# Patient Record
Sex: Male | Born: 1955 | Race: Black or African American | Hispanic: No | Marital: Married | Smoking: Former smoker
Health system: Southern US, Community
[De-identification: ages and names within clinical notes are randomized; demographics above are authoritative.]

## PROBLEM LIST (undated history)

## (undated) DIAGNOSIS — E785 Hyperlipidemia, unspecified: Secondary | ICD-10-CM

## (undated) DIAGNOSIS — I2510 Atherosclerotic heart disease of native coronary artery without angina pectoris: Secondary | ICD-10-CM

## (undated) DIAGNOSIS — G8929 Other chronic pain: Secondary | ICD-10-CM

## (undated) DIAGNOSIS — M25559 Pain in unspecified hip: Secondary | ICD-10-CM

## (undated) DIAGNOSIS — I10 Essential (primary) hypertension: Secondary | ICD-10-CM

## (undated) DIAGNOSIS — I219 Acute myocardial infarction, unspecified: Secondary | ICD-10-CM

## (undated) DIAGNOSIS — K219 Gastro-esophageal reflux disease without esophagitis: Secondary | ICD-10-CM

## (undated) DIAGNOSIS — E119 Type 2 diabetes mellitus without complications: Secondary | ICD-10-CM

## (undated) DIAGNOSIS — Z720 Tobacco use: Secondary | ICD-10-CM

## (undated) DIAGNOSIS — M549 Dorsalgia, unspecified: Secondary | ICD-10-CM

## (undated) HISTORY — PX: TOTAL HIP ARTHROPLASTY: SHX124

## (undated) HISTORY — DX: Acute myocardial infarction, unspecified: I21.9

---

## 1998-12-05 ENCOUNTER — Encounter: Payer: Self-pay | Admitting: Cardiovascular Disease

## 1998-12-05 ENCOUNTER — Inpatient Hospital Stay (HOSPITAL_COMMUNITY): Admission: EM | Admit: 1998-12-05 | Discharge: 1998-12-06 | Payer: Self-pay | Admitting: Emergency Medicine

## 2002-11-04 ENCOUNTER — Emergency Department (HOSPITAL_COMMUNITY): Admission: EM | Admit: 2002-11-04 | Discharge: 2002-11-04 | Payer: Self-pay | Admitting: Emergency Medicine

## 2005-12-09 ENCOUNTER — Ambulatory Visit (HOSPITAL_COMMUNITY): Admission: RE | Admit: 2005-12-09 | Discharge: 2005-12-09 | Payer: Self-pay | Admitting: Family Medicine

## 2005-12-16 ENCOUNTER — Encounter (INDEPENDENT_AMBULATORY_CARE_PROVIDER_SITE_OTHER): Payer: Self-pay | Admitting: Specialist

## 2005-12-16 ENCOUNTER — Inpatient Hospital Stay (HOSPITAL_COMMUNITY): Admission: RE | Admit: 2005-12-16 | Discharge: 2005-12-23 | Payer: Self-pay | Admitting: Orthopaedic Surgery

## 2005-12-24 ENCOUNTER — Encounter (HOSPITAL_COMMUNITY): Admission: RE | Admit: 2005-12-24 | Discharge: 2006-01-23 | Payer: Self-pay | Admitting: Orthopaedic Surgery

## 2005-12-24 ENCOUNTER — Ambulatory Visit (HOSPITAL_COMMUNITY): Payer: Self-pay | Admitting: Orthopaedic Surgery

## 2005-12-25 ENCOUNTER — Ambulatory Visit (HOSPITAL_COMMUNITY): Payer: Self-pay | Admitting: Orthopedic Surgery

## 2005-12-29 ENCOUNTER — Encounter (HOSPITAL_COMMUNITY): Admission: RE | Admit: 2005-12-29 | Discharge: 2006-01-28 | Payer: Self-pay | Admitting: Orthopaedic Surgery

## 2006-02-08 ENCOUNTER — Encounter (HOSPITAL_COMMUNITY): Admission: RE | Admit: 2006-02-08 | Discharge: 2006-03-10 | Payer: Self-pay | Admitting: Orthopaedic Surgery

## 2008-08-09 ENCOUNTER — Emergency Department (HOSPITAL_COMMUNITY): Admission: EM | Admit: 2008-08-09 | Discharge: 2008-08-09 | Payer: Self-pay | Admitting: Emergency Medicine

## 2011-02-12 NOTE — H&P (Signed)
[name], [name]            ACCOUNT NO.:  [id number]   MEDICAL RECORD NO.:  [id number]          PATIENT TYPE:  AMB   LOCATION:  DAY                           FACILITY:  [facility]   PHYSICIAN:  J. Darreld Mclean, M.D. DATE OF BIRTH:  1956-03-06   DATE OF ADMISSION:  DATE OF DISCHARGE:  LH                                HISTORY & PHYSICAL   CHIEF COMPLAINT:  Hip pain.   The patient is a 55 year old male with marked pain and tenderness of his hip  on the right. He has had hip pain now for over a year, getting progressively  worse. I first saw the patient in the office on February 13. He had been  followed by Dr. Nobie [name]. He had been on Naprosyn, Vicodin and prednisone  with minimal help in tenderness. He is actually complaining of knee pain at  the time, but on examination, it was apparent that it was not his knee but  was his hip. He had markedly decreased motion of his hip. X-rays of the hip  showed joint space narrowing and osteophytes of the femoral head and marked  changes in the femoral head. The acetabulum looked good, however. He denied  any falls, trauma or injury to the hip in the past. The pain has gotten  progressively worse. I talked to him about a hip replacement, a bipolar hip.  At the time I saw him, he did not have insurance, and he has obtained  Medicaid. I put him on a Medrol dose pack which significantly helped. I  changed him to Relafen, and that did not help as much. I told him I could  not keep him on the cortisone. We have talked, and he would like to go ahead  and have his surgery. Plan to do a bipolar hip because in the future he will  eventually need a revision due to his young age secondary to loosening. Plan  to do a bipolar hip with the possibility of a total, depending on how the  acetabulum looks at surgery. That way, the hip could be replaced later, or  the acetabulum could be revised. Went over the risks and imponderables in  detail with him including  infection, possibility of pulmonary embolism which  could result in death, leg length inequality, nerve injury, possible  loosening, further surgery secondary to arthritis of the acetabulum or the  so-called socket, the possibility for blood transfusions, need for physical  therapy both in the hospital and postoperative through home health - home  health will be arranged, and anesthesia risks. He elects to have general  anesthesia if possible.   PAST HISTORY:  Positive for hypertension, but he denies heart disease, lung  disease, kidney disease, stroke, paralysis or weakness, diabetes, TB,  rheumatic fever, cancer, ulcer disease, circulatory problems.   ALLERGIES:  He denies any allergies.   MEDICATIONS:  He is currently taking hydrocodone and Relafen.   SOCIAL HISTORY:  He smokes a pack a day. He uses alcoholic beverages  socially. Dr. Regino [name] is his family doctor.   He had a colonoscopy years ago. Hypertension runs in  the family. His father  has also had heart trouble.   The patient lives in [place] and is married.   PHYSICAL EXAMINATION:  VITAL SIGNS:  Blood pressure is 124/80, pulse 60,  respirations 16, afebrile. Height 5 foot 9-1/2, weight 234.  GENERAL:  He is alert, cooperative, oriented.  HEENT:  Negative.  NECK:  Supple.  LUNGS:  Clear to P&A.  HEART:  Regular without murmur heard.  EXTREMITIES:  Right hip has limited range of motion with markedly decreased  internal and external rotation. Selection is to 90. Extension is about 5  degrees. Left hip has full range of motion. Other extremities negative.  CENTRAL NERVOUS SYSTEM:  Intact.  SKIN:  Intact.   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   PLAN:  Bipolar hip replacement as stated above. Risks and imponderables have  been discussed. Labs are pending. He will be admitted after surgery.                                            ______________________________  [name]. Darreld Mclean, M.D.      JWK/MEDQ  D:  12/15/2005  T:  12/15/2005  Job:  [id number]

## 2011-02-12 NOTE — Op Note (Signed)
[name], [name]            ACCOUNT NO.:  [id number]   MEDICAL RECORD NO.:  [id number]          PATIENT TYPE:  INP   LOCATION:  A339                          FACILITY:  [facility]   PHYSICIAN:  J. Darreld Mclean, M.D. DATE OF BIRTH:  10-Jan-1956   DATE OF PROCEDURE:  12/16/2005  DATE OF DISCHARGE:                                 OPERATIVE REPORT   PREOPERATIVE DIAGNOSIS:  Severe degenerative joint disease of the right hip.   POSTOPERATIVE DIAGNOSIS:  Severe degenerative joint disease of the right  hip.   PROCEDURE:  Bipolar hip replacement, elective, on the right, using a [employer] press-fit max stem, #10, with a 14-mm distal diameter, 0 neck, 55  bipolar head. The hip __________ Secure-fit plus max 127-degree neck angle  stem.   ANESTHESIA:  Spinal.   SURGEON:  J. Darreld Mclean, M.D.   DRAINS:  One large Hemovac drain used.   BLOOD LOSS:  Approximately 700 to 750 cc of blood loss. One unit of blood  will be given in recovery.   INDICATIONS:  The patient is a 55 year old male with significant  degenerative joint disease of the right hip. It is mainly affecting the  femoral side. The acetabulum does not have much change. Risks and  imponderables of the procedure were discussed in detail on several occasions  to the patient. These included infection, leg length inequality, nerve  injury, pulmonary embolism which could lead to death, dislocation,  loosening, pain and pain control, possible need for blood transfusion - he  has declined autotransfusion of blood, need for physical therapy both in the  hospital and through home health, anesthesia risks. He has agreed for spinal  anesthesia. He also understands that over time that hip will come loose and  due to his age will need to be replaced at a later time. That is why I have  elected to do a bipolar press fit that can be revised at a later time. He is  agreeable to this.   DESCRIPTION OF PROCEDURE:  The patient taken  to the holding area. The right  hip was identified as the correct surgical site. He placed a mark over the  right hip area; so did I. He was taken to the operating room and given  spinal anesthesia. He was placed right side up/left side down decubitus  position, held by supports. He was prepped and draped in the usual manner.  Had a time out identifying the patient and doing the right hip procedure.  Posterior approach was made to the hip. Sciatica nerve was identified and  protected with a Penrose drain. Short external rotators identified, tagged  and cut. Hip capsule was opened. Cultures obtained. Femoral head was  identified and dislocated from the acetabulum. Cut was set to remove the  femoral head. Femoral head measured 55 mm. Canal was prepared first using a  box osteotome and then the reamers and the rasp. We got an x-ray. It was a  size 9, and I felt we could go a little bit larger. We got a little more  lateral placement and put a #10  prosthesis and fit nicely. Then we used a so-  called gold broach, used that, got an x-ray with a plus neck and 55 hip, and  this looked very nice. Leg lengths were equal. It was a very nice fit. These  were removed. Permanent prosthesis were put in with a #10 Secure-Fit stem,  max type with a plus 0 neck and a 55 bipolar head. X-rays were taken. These  looked very good. Short external rotators were reapproximated with  previously applied tags. Sciatica nerve was identified and had no apparent  injury. Penrose drain was removed. Hemovac drain was placed, sewn in with 2-  0 silk. Fascial layers were reapproximated using a #1 Surgilon suture,  interrupted figure-of-eight fashion. The subcutaneous tissue was  reapproximated in layers with 2-0 plain and skin reapproximated with skin  staples. Sterile dressing applied. Abduction placed between the knees. She  tolerated the procedure well and will go to recovery in good condition.            ______________________________  [name]. Darreld Mclean, M.D.     JWK/MEDQ  D:  12/16/2005  T:  12/17/2005  Job:  [id number]

## 2011-02-12 NOTE — Discharge Summary (Signed)
[name], [name]            ACCOUNT NO.:  [id number]   MEDICAL RECORD NO.:  [id number]          PATIENT TYPE:  REC   LOCATION:  REH                           FACILITY:  [facility]   PHYSICIAN:  J. Darreld Mclean, M.D. DATE OF BIRTH:  1955/12/21   DATE OF ADMISSION:  12/29/2005  DATE OF DISCHARGE:  LH                                 DISCHARGE SUMMARY   DISCHARGE DIAGNOSES:  1.  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  2.  Cellulitis of the hip wound post surgery.   PROCEDURE PERFORMED:  Bipolar hip prosthesis on the right.   DISCHARGE STATUS:  Improved.   PROGNOSIS:  Good.   DISPOSITION:  Home.   DISCHARGE MEDICATIONS:  1.  Vicodin ES one q.4h. p.r.n. pain.  2.  The patient is also arranged for outpatient IV vancomycin therapy.   HISTORY OF PRESENT ILLNESS:  The patient has significant degenerative joint  disease of his right hip.  This is a young gentleman of 55 years old.  I  discussed the possibility of total hip versus bipolar hip, and he would need  another hip later on his life, and recommended bipolar of his acetabulum  portion.  If it looked good during surgery it would not be replaced with a  total hip.  The acetabulum did look good.  The femoral head, however, had  significant degenerative joint disease.  He tolerated the bipolar hip and  did well.  His hemoglobin the first day post surgery was 12.6, blood sugar  was elevated at 144.  His pain was controlled by PCA morphine.  He was seen  in physical therapy.  The second postoperative day, he had a temperature to  101, his labs were normal.  His Hemovac was removed.  His wound looked good.  I began him on Levaquin.  The third postoperative day, his temperature was  still 101, white count elevated to 15,000, hemoglobin was normal.  Because  of some serosanguineous drainage, I put him on vancomycin because of the  change in the hip wound.  He was seen in physical therapy and continued to  progress.  On January 20, 2006, the fourth postoperative day, the wound still  had some erythema, but his temperature decreased with no chills, still some  drainage.  His white count had decreased to 12,500.  On the next day, his  temperature dropped to 99.6, wound was improving, I wanted to continue the  IV vancomycin.  His potassium dropped to 2.8, and was supplemented.  I had  him seen by the PICC nurse, and on January 22, 2006, a PICC line was  established.  His temperature had dropped to 99.5 maximum.  Wound was  improving.  I wanted him to continue on IV vancomycin postoperatively.  This  was arranged.  On January 23, 2006, he was doing well, he was afebrile for 24  hours, and wound  had very small area of drainage, and he was significantly improved.  It was  arranged for IV vancomycin as an outpatient, and he was discharged home.  I  will see him in  the office on Jan 27, 2006.  [facility] was arranged  for daily IV vancomycin.                                            ______________________________  [name]. Darreld Mclean, M.D.     JWK/MEDQ  D:  01/27/2006  T:  01/27/2006  Job:  [id number]

## 2011-04-16 ENCOUNTER — Other Ambulatory Visit (HOSPITAL_COMMUNITY): Payer: Self-pay | Admitting: Orthopedic Surgery

## 2011-04-19 ENCOUNTER — Other Ambulatory Visit (HOSPITAL_COMMUNITY): Payer: Self-pay | Admitting: Orthopedic Surgery

## 2011-04-19 DIAGNOSIS — M25551 Pain in right hip: Secondary | ICD-10-CM

## 2011-04-19 DIAGNOSIS — T84030A Mechanical loosening of internal right hip prosthetic joint, initial encounter: Secondary | ICD-10-CM

## 2011-04-26 ENCOUNTER — Encounter (HOSPITAL_COMMUNITY): Payer: Self-pay

## 2011-04-26 ENCOUNTER — Encounter (HOSPITAL_COMMUNITY)
Admission: RE | Admit: 2011-04-26 | Discharge: 2011-04-26 | Disposition: A | Discharge status: Home / Self Care | Payer: Medicare Other | Source: Ambulatory Visit | Attending: Orthopedic Surgery | Admitting: Orthopedic Surgery

## 2011-04-26 DIAGNOSIS — M25559 Pain in unspecified hip: Secondary | ICD-10-CM | POA: Insufficient documentation

## 2011-04-26 DIAGNOSIS — M25551 Pain in right hip: Secondary | ICD-10-CM

## 2011-04-26 DIAGNOSIS — T84039A Mechanical loosening of unspecified internal prosthetic joint, initial encounter: Secondary | ICD-10-CM | POA: Insufficient documentation

## 2011-04-26 DIAGNOSIS — Z96649 Presence of unspecified artificial hip joint: Secondary | ICD-10-CM | POA: Insufficient documentation

## 2011-04-26 DIAGNOSIS — T84030A Mechanical loosening of internal right hip prosthetic joint, initial encounter: Secondary | ICD-10-CM

## 2011-04-26 HISTORY — DX: Essential (primary) hypertension: I10

## 2011-04-26 MED ORDER — TECHNETIUM TC 99M MEDRONATE IV KIT
25.0000 | PACK | Freq: Once | INTRAVENOUS | Status: AC | PRN
  Administered 2011-04-26: 23.9 via INTRAVENOUS

## 2011-06-05 NOTE — H&P (Signed)
[name], [name] NO.:  [id number]  MEDICAL RECORD NO.:  [id number]  LOCATION:  1S                           FACILITY:  [facility]  PHYSICIAN:  Madlyn Frankel. Charlann Boxer, M.D.  DATE OF BIRTH:  [birth year]  DATE OF ADMISSION:  05/17/2011 DATE OF DISCHARGE:                             HISTORY & PHYSICAL   DATE OF SURGERY:  06/14/2011  CHIEF COMPLAINT:  Right hip pain/loosening of prosthetic joint.  HISTORY OF PRESENT ILLNESS:  The patient is a 55 year old black gentleman, in no acute distress.  The patient states that because of arthritic changes, the patient had a partial right hip replacement in December 21, 2005, by Dr. Tresa Endo.  The patient has never had any relief of symptoms and has had pain since.  The patient also complains of stiffness in the right hip.  The patient has been having the use of cane or walker to assist an ambulating.  X-rays of the right hip do show a hemiarthroplasty with bipolar components in place without evidence of osteolysis of the proximal femur, but acetabular middle contact.  The patient also had a bone scan ordered, which was negative for any loosening throughout the right hip.  Due to the constant pain and failure of conservative treatment to alleviate any symptoms, options were discussed with the patient.  The patient wishes to proceed with surgery.  Risks, benefits, and expectations of procedure were discussed with the patient.  The patient understands risks, benefits, and expectations and wishes to proceed with a right total hip replacement per Dr. Charlann Boxer.  PRIMARY CARE PHYSICIAN:  Kirk Ruths, M.D.  PAST MEDICAL HISTORY: 1. Anxiety. 2. High blood pressure. 3. Heart disease. 4. Diabetes.  The patient's plan is to be discharged home afterwards.  The patient is also being given his prescription for aspirin, Robaxin, iron, Colace and MiraLax.  PAST SURGICAL HISTORY: 1. Partial hip replacement, December 21, 2005. 2. 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  MEDICATIONS: 1. Metformin 500 mg one p.o. b.i.d. 2. Lisinopril 5 mg one p.o. daily. 3. Lipitor 20 mg one p.o. daily. 4. Oxycodone/acetaminophen 10/325 mg one p.o. q.4-6 hours p.r.n. pain.  ALLERGIES:  No known drug allergies.  SOCIAL HISTORY:  The patient denies use of alcohol or tobacco at this time.  REVIEW OF SYSTEMS:  The patient does complain of urinary frequency also joint pain, swelling, and morning stiffness of the right hip.  PHYSICAL EXAMINATION:  GENERAL:  The patient is a 55 year old black male, in no acute distress. VITAL SIGNS:  Stable.  Blood pressure is 164/100, respirations 16, and pulse is 72. HEENT:  Pupils are equal, round, and reactive to light and accommodation.  Throat is clear. NECK:  Supple.  No JVD.  No carotid bruits.  No lymphadenopathy. CARDIO:  Normal appearing S1 and S2.  No murmur appreciated. RESPIRATORY:  Lungs are clear to auscultation bilaterally. NEURO:  The patient is oriented x3 with those pertaining to the right hip.  The patient has no real pain on palpation on the lateral aspect of the hip.  The patient's range of motion is limited due to pain.  The patient has good sensation to light touch distally.  The patient has +2 dorsalis pedis pulse.  IMPRESSION:  Right hip loosening of prosthetic joint.  STUDIES:  X-rays and bone scan as above.  PLAN:  The patient will be admitted to the hospital to undergo conversion from a hemiarthroplasty to a total hip arthroplasty.  Risks, benefits, and expectations of the procedure were discussed with the patient.  The patient understands risks, benefits, and expectations and wishes to proceed with surgery.    ______________________________ Lanney Gins, PA   ______________________________ Madlyn Frankel. Charlann Boxer, M.D.    MB/MEDQ  D:  06/02/2011  T:  06/02/2011  Job:  [id number]  Electronically Signed by Lanney Gins PA on 06/03/2011 04:54:09  PM Electronically Signed by Durene Romans M.D. on 06/05/2011 07:15:08 AM

## 2011-06-08 ENCOUNTER — Other Ambulatory Visit (HOSPITAL_COMMUNITY): Payer: Self-pay | Admitting: Orthopedic Surgery

## 2011-06-08 ENCOUNTER — Other Ambulatory Visit: Payer: Self-pay | Admitting: Orthopedic Surgery

## 2011-06-08 ENCOUNTER — Ambulatory Visit (HOSPITAL_COMMUNITY)
Admission: RE | Admit: 2011-06-08 | Discharge: 2011-06-08 | Disposition: A | Discharge status: Home / Self Care | Payer: Medicare Other | Source: Ambulatory Visit | Attending: Orthopedic Surgery | Admitting: Orthopedic Surgery

## 2011-06-08 ENCOUNTER — Encounter (HOSPITAL_COMMUNITY): Payer: Medicare Other

## 2011-06-08 DIAGNOSIS — T84498A Other mechanical complication of other internal orthopedic devices, implants and grafts, initial encounter: Secondary | ICD-10-CM | POA: Insufficient documentation

## 2011-06-08 DIAGNOSIS — Z01811 Encounter for preprocedural respiratory examination: Secondary | ICD-10-CM

## 2011-06-08 DIAGNOSIS — Z01812 Encounter for preprocedural laboratory examination: Secondary | ICD-10-CM | POA: Insufficient documentation

## 2011-06-08 DIAGNOSIS — Z01818 Encounter for other preprocedural examination: Secondary | ICD-10-CM | POA: Insufficient documentation

## 2011-06-08 DIAGNOSIS — Y831 Surgical operation with implant of artificial internal device as the cause of abnormal reaction of the patient, or of later complication, without mention of misadventure at the time of the procedure: Secondary | ICD-10-CM | POA: Insufficient documentation

## 2011-06-08 LAB — DIFFERENTIAL
Basophils Absolute: 0 10*3/uL (ref 0.0–0.1)
Basophils Relative: 1 % (ref 0–1)
Eosinophils Absolute: 0.1 10*3/uL (ref 0.0–0.7)
Monocytes Absolute: 0.5 10*3/uL (ref 0.1–1.0)
Neutro Abs: 4 10*3/uL (ref 1.7–7.7)

## 2011-06-08 LAB — URINALYSIS, ROUTINE W REFLEX MICROSCOPIC
Glucose, UA: NEGATIVE mg/dL
Ketones, ur: NEGATIVE mg/dL
Leukocytes, UA: NEGATIVE
Nitrite: NEGATIVE
Protein, ur: NEGATIVE mg/dL
Urobilinogen, UA: 0.2 mg/dL (ref 0.0–1.0)

## 2011-06-08 LAB — CBC
Hemoglobin: 15.3 g/dL (ref 13.0–17.0)
MCH: 30.8 pg (ref 26.0–34.0)
MCHC: 33.6 g/dL (ref 30.0–36.0)
Platelets: 220 10*3/uL (ref 150–400)
RDW: 13.9 % (ref 11.5–15.5)

## 2011-06-08 LAB — BASIC METABOLIC PANEL
BUN: 8 mg/dL (ref 6–23)
CO2: 26 mEq/L (ref 19–32)
Chloride: 103 mEq/L (ref 96–112)
GFR calc non Af Amer: >60 mL/min (ref >60)
Glucose, Bld: 109 mg/dL — ABNORMAL HIGH (ref 70–99)
Potassium: 3.9 mEq/L (ref 3.5–5.1)
Sodium: 139 mEq/L (ref 135–145)

## 2011-06-08 LAB — URINE MICROSCOPIC-ADD ON

## 2011-06-08 LAB — SURGICAL PCR SCREEN: Staphylococcus aureus: NEGATIVE

## 2011-06-14 ENCOUNTER — Inpatient Hospital Stay (HOSPITAL_COMMUNITY): Payer: Medicare Other

## 2011-06-14 ENCOUNTER — Inpatient Hospital Stay (HOSPITAL_COMMUNITY)
Admission: RE | Admit: 2011-06-14 | Discharge: 2011-06-16 | DRG: 468 | Disposition: A | Discharge status: Home Health | Payer: Medicare Other | Source: Ambulatory Visit | Attending: Orthopedic Surgery | Admitting: Orthopedic Surgery

## 2011-06-14 DIAGNOSIS — Y831 Surgical operation with implant of artificial internal device as the cause of abnormal reaction of the patient, or of later complication, without mention of misadventure at the time of the procedure: Secondary | ICD-10-CM | POA: Diagnosis present

## 2011-06-14 DIAGNOSIS — E669 Obesity, unspecified: Secondary | ICD-10-CM | POA: Diagnosis present

## 2011-06-14 DIAGNOSIS — F411 Generalized anxiety disorder: Secondary | ICD-10-CM | POA: Diagnosis present

## 2011-06-14 DIAGNOSIS — T84099A Other mechanical complication of unspecified internal joint prosthesis, initial encounter: Principal | ICD-10-CM | POA: Diagnosis present

## 2011-06-14 DIAGNOSIS — Z9861 Coronary angioplasty status: Secondary | ICD-10-CM

## 2011-06-14 DIAGNOSIS — I10 Essential (primary) hypertension: Secondary | ICD-10-CM | POA: Diagnosis present

## 2011-06-14 DIAGNOSIS — Z79899 Other long term (current) drug therapy: Secondary | ICD-10-CM

## 2011-06-14 DIAGNOSIS — Z96649 Presence of unspecified artificial hip joint: Secondary | ICD-10-CM

## 2011-06-14 DIAGNOSIS — F17200 Nicotine dependence, unspecified, uncomplicated: Secondary | ICD-10-CM | POA: Diagnosis present

## 2011-06-14 DIAGNOSIS — I519 Heart disease, unspecified: Secondary | ICD-10-CM | POA: Diagnosis present

## 2011-06-14 DIAGNOSIS — E119 Type 2 diabetes mellitus without complications: Secondary | ICD-10-CM | POA: Diagnosis present

## 2011-06-14 DIAGNOSIS — Z01812 Encounter for preprocedural laboratory examination: Secondary | ICD-10-CM

## 2011-06-14 DIAGNOSIS — I2510 Atherosclerotic heart disease of native coronary artery without angina pectoris: Secondary | ICD-10-CM | POA: Diagnosis present

## 2011-06-14 LAB — TYPE AND SCREEN: ABO/RH(D): O POS

## 2011-06-14 LAB — ABO/RH: ABO/RH(D): O POS

## 2011-06-14 LAB — GLUCOSE, CAPILLARY: Glucose-Capillary: 128 mg/dL — ABNORMAL HIGH (ref 70–99)

## 2011-06-15 LAB — GLUCOSE, CAPILLARY
Glucose-Capillary: 151 mg/dL — ABNORMAL HIGH (ref 70–99)
Glucose-Capillary: 160 mg/dL — ABNORMAL HIGH (ref 70–99)
Glucose-Capillary: 119 mg/dL — ABNORMAL HIGH (ref 70–99)
Glucose-Capillary: 160 mg/dL — ABNORMAL HIGH (ref 70–99)

## 2011-06-15 LAB — CBC
Hemoglobin: 12.6 g/dL — ABNORMAL LOW (ref 13.0–17.0)
MCH: 30.5 pg (ref 26.0–34.0)
MCV: 93.7 fL (ref 78.0–100.0)
Platelets: 201 10*3/uL (ref 150–400)
RBC: 4.13 MIL/uL — ABNORMAL LOW (ref 4.22–5.81)
WBC: 9.2 10*3/uL (ref 4.0–10.5)

## 2011-06-15 LAB — BASIC METABOLIC PANEL
CO2: 26 mEq/L (ref 19–32)
Chloride: 103 mEq/L (ref 96–112)
Glucose, Bld: 146 mg/dL — ABNORMAL HIGH (ref 70–99)
Sodium: 137 mEq/L (ref 135–145)

## 2011-06-16 LAB — CBC
MCHC: 32.5 g/dL (ref 30.0–36.0)
Platelets: 187 10*3/uL (ref 150–400)
RDW: 14.1 % (ref 11.5–15.5)
WBC: 10.8 10*3/uL — ABNORMAL HIGH (ref 4.0–10.5)

## 2011-06-16 LAB — BASIC METABOLIC PANEL
Chloride: 102 mEq/L (ref 96–112)
GFR calc Af Amer: >60 mL/min (ref >60)
GFR calc non Af Amer: >60 mL/min (ref >60)
Potassium: 3.2 mEq/L — ABNORMAL LOW (ref 3.5–5.1)
Sodium: 136 mEq/L (ref 135–145)

## 2011-06-16 LAB — GLUCOSE, CAPILLARY: Glucose-Capillary: 141 mg/dL — ABNORMAL HIGH (ref 70–99)

## 2011-06-17 NOTE — Op Note (Signed)
  NAMEKAZIMIR, HARTNETT NO.:  [id number]  MEDICAL RECORD NO.:  [id number]  LOCATION:  1615                         FACILITY:  [facility]  PHYSICIAN:  Madlyn Frankel. Charlann Boxer, M.D.  DATE OF BIRTH:  06-22-1956  DATE OF PROCEDURE: DATE OF DISCHARGE:                              OPERATIVE REPORT   ADDENDUM: The utilization of a physician assistant was necessary for preoperative and perioperative positioning, retractor management, as well as general facilitation of the case.  Physician assistant was present for the entire portion of case including wound closure.     Madlyn Frankel Charlann Boxer, M.D.     MDO/MEDQ  D:  06/14/2011  T:  06/14/2011  Job:  [id number]  Electronically Signed by Durene Romans M.D. on 06/17/2011 08:39:40 PM

## 2011-06-17 NOTE — Op Note (Signed)
[name], [name] NO.:  [id number]  MEDICAL RECORD NO.:  [id number]  LOCATION:  1615                         FACILITY:  [facility]  PHYSICIAN:  Madlyn Frankel. Charlann Boxer, M.D.  DATE OF BIRTH:  1956/05/22  DATE OF PROCEDURE:  06/14/2011 DATE OF DISCHARGE:                              OPERATIVE REPORT   PREOPERATIVE DIAGNOSIS:  Failed right hip hemiarthroplasty.  POSTOPERATIVE DIAGNOSIS:  Failed right hip hemiarthroplasty.Marland Kitchen  PROCEDURE:  Conversion of failed right hip hemiarthroplasty or right total hip replacement utilizing a [employer]on[employer] size 54 Trident PSL cup with a single cancellous screw utilizing the MDM system liner 46 mm with a 28 +0 inner ball and 46 X3 inserts.  SURGEON:  Madlyn Frankel. Charlann Boxer, M.D.  ASSISTANT:  Lanney Gins, PA-C  ANESTHESIA:  General.  SPECIMENS:  None.  COMPLICATIONS:  None.  DRAINS:  One Hemovac.  BLOOD LOSS:  About 400 mL.  INDICATIONS FOR PROCEDURE:  [name] is a 55 year old gentleman who presented to the office for evaluation of right hip pain.  He had a right hip hemiarthroplasty, which was done here 4 years prior.  He reported groin pain for some time, at this point radiographs revealed no evidence of any acetabular cartilage left.  Given his age and findings radiographically, it was felt that his pathology was related to the metal on bone articulation.  Workup for infection preoperatively was negative.  Risks of persistent pain and discomfort postoperatively, infection, DVT, dislocation were all discussed and reviewed.  Consent was obtained for the above.  PROCEDURE IN DETAIL:  The patient was brought to the operative theater. Once adequate anesthesia, preoperative antibiotics, Ancef administered, he was positioned into the left lateral decubitus position, right side up.  The right lower extremity was prepped and draped in sterile fashion.  The patient's old incision was identified to be a large Southern type exposure  with a sharp angle and somewhat posterior to the trochanter; however, I felt that I could utilize this approach.  Time- out was performed identifying the patient, planned procedure, extremity.  The patient's old incision, a portion of this was utilized, excising the old scar.  Sharp dissection was carried down to the iliotibial band and gluteal fascia identifying the Ethibond sutures, which were removed.  The gluteus maximus fascia and the iliotibial band were incised posteriorly.  The first portion of this case was carried out for exposure and scar debridement.  The posterior two thirds of the hip joint were exposed, preserving the pseudocapsule.  The hip was dislocated and the bipolar ball removed.  The patient was noted to be very stiff and tight with scar.  After further attempts, I was unable to get the trunnion on to stay on the ilium, thus it was held out of the way with retractors and bone hook during the preparation phase of the case.  Once the hip was adequately exposed and I was pleased with the over debridement I did begin reaming with a 45 reamer.  I reamed up to 54 reamer and used a 54 PSL cup.  I removed the 56 bipolar ball.  The 54 PSL cup was impacted again with the femur retracted all the away using bone hook  preserving the trunnion.  The cup appeared to be positioned at about 35-40 degrees of abduction, 20 degrees of forward flexion.  The cup was impacted and was well seated.  I placed a single cancellous screw.  I had made sure preoperatively to use this MDM shell and liner based on his age.  The MDM 46 liner was then impacted into the Trident shell with good secure fit.  Based on the patient's leg lengths radiographically as well as tightness, I chose the joint, used a 28 +0 inner ball.  This was placed into the 46 mm X3 insert for the MDM on the back table.  The two were then impacted onto a dry trunnion and the hip reduced.  The hip had been irrigated  throughout the case and again at this point. We reapproximated the pseudocapsule using #1 Vicryl.  The remainder of the wound was closed with #1 Vicryl in the gluteal fascia and the iliotibial band.  The remainder of the wound was closed with 2-0 Vicryl and staples on the skin.  The skin was cleaned, dried and dressed sterilely using an Aquacel dressing, the drain site was dressed separately.  He was then brought to the recovery room, extubated in stable condition, tolerating the procedure well.     Madlyn Frankel Charlann Boxer, M.D.     MDO/MEDQ  D:  06/14/2011  T:  06/14/2011  Job:  [id number]  Electronically Signed by Durene Romans M.D. on 06/17/2011 08:39:45 PM

## 2011-06-17 NOTE — Discharge Summary (Signed)
NAMECONTRELL, BALLENTINE NO.:  [id number]  MEDICAL RECORD NO.:  [id number]  LOCATION:  1615                         FACILITY:  [facility]  PHYSICIAN:  Madlyn Frankel. Charlann Boxer, M.D.  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  ATTENDING PHYSICIAN:  Madlyn Frankel. Charlann Boxer, M.D.  ADMITTING DIAGNOSIS:  Right hip pain/loosening of prosthetic joint.  DISCHARGE DIAGNOSES: 1. Status post right hip total hip revision. 2. Anxiety. 3. High blood pressure. 4. Heart disease. 5. Diabetes.  HISTORY OF PRESENT ILLNESS:  The patient is a 55 year old black gentleman in no acute distress.  The patient states that because of arthritic changes, the patient had a partial right hip replacement in March 2007 by Dr. Tresa Endo.  The patient has never had any relief of symptoms and has had pain since that time.  The patient also complains of stiffness of the right hip.  The patient has been having to use a cane or walker to assist in ambulating.  X-rays of the right hip do show hemiarthroplasty with bipolar components in place without evidence of osteolysis of the proximal femur.  The patient did have a bone scan, which was negative for any loosening throughout the right hip.  Due to the constant pain and failure of the conservative treatment to alleviate his symptoms, options were discussed with the patient.  The patient wishes to proceed with surgery.  Risks, benefits and expectations of the procedure discussed with the patient.  The patient understood the risks, benefits and expectations and wished to proceed with surgery.  HOSPITAL COURSE:  The patient underwent the above-stated procedure on June 14, 2011.  The patient tolerated the procedure well, was brought to the recovery room in good condition and  subsequently to the floor.  Postop day #1, June 15, 2011:  The patient doing well, no events. Pain was controlled, afebrile, vital signs stable.  H and H was 12.6/38.7.  Dressings good, clean, dry and intact.  He was distally and neurovascularly intact.  Hemovac drain was removed.  IV was changed to saline lock.  The patient had physical therapy.  Postop day #2, June 16, 2011:  The patient doing well, afebrile, vital signs stable.  Hematocrit was 37.2, dressing of the right hip was dry.  He was neurovascularly intact.  It was felt that the patient was doing well enough to be discharged home with home health PT.  DISCHARGE CONDITION:  Good.  DISCHARGE INSTRUCTIONS:  The patient will be discharged home with home health PT.  The patient will be weightbearing as tolerated.  The patient will maintain the surgical dressing for about 8 days after which time he will replace with gauze and tape.  The patient is to keep the area dry and clean until followup.  The patient will follow up with Orthopedics in 2 weeks.  The patient is to call with any questions or concerns.  DISCHARGE MEDICATIONS: 1. Aspirin enteric-coated 325 mg one p.o. b.i.d. x4 weeks. 2. Benadryl 25 mg one p.o. q.4 hours p.r.n. 3.  Colace 100 mg b.i.d. constipation. 4. Iron sulfate 325 mg one p.o. t.i.d. times 2 to 3 weeks. 5. Norco 7.5/325 one to two p.o. q.4- 6 hours p.r.n. pain. 6. Robaxin 500 mg one p.o. q.6 hours p.r.n. muscle spasms. 7. MiraLAX 17 g p.o. b.i.d. constipation. 8. Flomax 0.4 mg one p.o. q. day. 9. Lipitor 20 mg one p.o. q.h.s. 10.Lisinopril 5 mg one p.o. q.a.m. 11.Metformin 500 mg one p.o. b.i.d.    ______________________________ Lanney Gins, PA   ______________________________ Madlyn Frankel. Charlann Boxer, M.D.    MB/MEDQ  D:  06/16/2011  T:  06/16/2011  Job:  [id number]  cc:   Kirk Ruths, M.D. Fax: [phone]  Electronically Signed by Lanney Gins PA on 06/17/2011 01:52:56  PM Electronically Signed by Durene Romans M.D. on 06/17/2011 08:39:48 PM

## 2011-08-06 ENCOUNTER — Other Ambulatory Visit (HOSPITAL_COMMUNITY): Payer: Self-pay | Admitting: Physician Assistant

## 2011-08-06 ENCOUNTER — Ambulatory Visit (HOSPITAL_COMMUNITY)
Admission: RE | Admit: 2011-08-06 | Discharge: 2011-08-06 | Disposition: A | Discharge status: Home / Self Care | Payer: Medicare Other | Source: Ambulatory Visit | Attending: Physician Assistant | Admitting: Physician Assistant

## 2011-08-06 DIAGNOSIS — M76899 Other specified enthesopathies of unspecified lower limb, excluding foot: Secondary | ICD-10-CM

## 2011-08-06 DIAGNOSIS — M25559 Pain in unspecified hip: Secondary | ICD-10-CM | POA: Insufficient documentation

## 2011-08-06 DIAGNOSIS — Z96649 Presence of unspecified artificial hip joint: Secondary | ICD-10-CM | POA: Insufficient documentation

## 2011-09-01 ENCOUNTER — Other Ambulatory Visit (HOSPITAL_COMMUNITY): Payer: Self-pay | Admitting: Orthopedic Surgery

## 2011-09-01 DIAGNOSIS — M25551 Pain in right hip: Secondary | ICD-10-CM

## 2011-09-06 ENCOUNTER — Encounter (HOSPITAL_COMMUNITY)
Admission: RE | Admit: 2011-09-06 | Discharge: 2011-09-06 | Disposition: A | Discharge status: Home / Self Care | Payer: Medicare Other | Source: Ambulatory Visit | Attending: Orthopedic Surgery | Admitting: Orthopedic Surgery

## 2011-09-06 ENCOUNTER — Encounter (HOSPITAL_COMMUNITY): Payer: Self-pay

## 2011-09-06 DIAGNOSIS — M25551 Pain in right hip: Secondary | ICD-10-CM

## 2011-09-06 DIAGNOSIS — Z96649 Presence of unspecified artificial hip joint: Secondary | ICD-10-CM | POA: Insufficient documentation

## 2011-09-06 DIAGNOSIS — M25559 Pain in unspecified hip: Secondary | ICD-10-CM | POA: Insufficient documentation

## 2011-09-06 MED ORDER — TECHNETIUM TC 99M MEDRONATE IV KIT
25.0000 | PACK | Freq: Once | INTRAVENOUS | Status: AC | PRN
  Administered 2011-09-06: 23.5 via INTRAVENOUS

## 2012-08-17 ENCOUNTER — Encounter (HOSPITAL_COMMUNITY): Payer: Self-pay | Admitting: Emergency Medicine

## 2012-08-17 ENCOUNTER — Emergency Department (HOSPITAL_COMMUNITY)
Admission: EM | Admit: 2012-08-17 | Discharge: 2012-08-17 | Disposition: A | Discharge status: Home / Self Care | Payer: Medicare Other | Attending: Emergency Medicine | Admitting: Emergency Medicine

## 2012-08-17 DIAGNOSIS — Z96649 Presence of unspecified artificial hip joint: Secondary | ICD-10-CM | POA: Diagnosis not present

## 2012-08-17 DIAGNOSIS — E119 Type 2 diabetes mellitus without complications: Secondary | ICD-10-CM | POA: Insufficient documentation

## 2012-08-17 DIAGNOSIS — IMO0001 Reserved for inherently not codable concepts without codable children: Secondary | ICD-10-CM | POA: Insufficient documentation

## 2012-08-17 DIAGNOSIS — I10 Essential (primary) hypertension: Secondary | ICD-10-CM | POA: Insufficient documentation

## 2012-08-17 DIAGNOSIS — M25559 Pain in unspecified hip: Secondary | ICD-10-CM | POA: Insufficient documentation

## 2012-08-17 DIAGNOSIS — M549 Dorsalgia, unspecified: Secondary | ICD-10-CM

## 2012-08-17 DIAGNOSIS — F17200 Nicotine dependence, unspecified, uncomplicated: Secondary | ICD-10-CM | POA: Diagnosis not present

## 2012-08-17 DIAGNOSIS — M545 Low back pain: Secondary | ICD-10-CM | POA: Diagnosis not present

## 2012-08-17 MED ORDER — DEXAMETHASONE SODIUM PHOSPHATE 4 MG/ML IJ SOLN
8.0000 mg | Freq: Once | INTRAMUSCULAR | Status: AC
  Administered 2012-08-17: 8 mg via INTRAMUSCULAR
  Filled 2012-08-17: qty 2

## 2012-08-17 MED ORDER — DEXAMETHASONE 6 MG PO TABS: ORAL_TABLET | ORAL | Status: DC

## 2012-08-17 MED ORDER — HYDROCODONE-ACETAMINOPHEN 5-325 MG PO TABS: 1.0000 | ORAL_TABLET | ORAL | Status: DC | PRN

## 2012-08-17 NOTE — ED Notes (Signed)
Pt c/o rt hip pain x 2 days. Pt denies any injury.

## 2012-08-17 NOTE — ED Provider Notes (Signed)
Medical screening examination/treatment/procedure(s) were performed by non-physician practitioner and as supervising physician I was immediately available for consultation/collaboration.   Joya Gaskins, MD 08/17/12 1530

## 2012-08-17 NOTE — ED Provider Notes (Signed)
History     CSN: [id number]  Arrival date & time 08/17/12  0906   First MD Initiated Contact with Patient 08/17/12 0914      Chief Complaint  Patient presents with  . Hip Pain    (Consider location/radiation/quality/duration/timing/severity/associated sxs/prior treatment) Patient is a 56 y.o. male presenting with hip pain. The history is provided by the patient.  Hip Pain This is a chronic problem. The current episode started more than 1 year ago. The problem occurs daily. The problem has been gradually worsening. Associated symptoms include arthralgias and myalgias. Pertinent negatives include no abdominal pain, chest pain, coughing, fever or neck pain. The symptoms are aggravated by standing and walking. He has tried nothing for the symptoms. The treatment provided no relief.    Past Medical History  Diagnosis Date  . Diabetes mellitus   . Hypertension     Past Surgical History  Procedure Date  . Joint replacement     History reviewed. No pertinent family history.  History  Substance Use Topics  . Smoking status: Current Every Day Smoker    Types: Cigarettes  . Smokeless tobacco: Not on file  . Alcohol Use: Yes      Review of Systems  Constitutional: Negative for fever and activity change.       All ROS Neg except as noted in HPI  HENT: Negative for nosebleeds and neck pain.   Eyes: Negative for photophobia and discharge.  Respiratory: Negative for cough, shortness of breath and wheezing.   Cardiovascular: Negative for chest pain and palpitations.  Gastrointestinal: Negative for abdominal pain and blood in stool.  Genitourinary: Negative for dysuria, frequency and hematuria.  Musculoskeletal: Positive for myalgias and arthralgias. Negative for back pain.  Skin: Negative.   Neurological: Negative for dizziness, seizures and speech difficulty.  Psychiatric/Behavioral: Negative for hallucinations and confusion.    Allergies  Review of patient's allergies  indicates no known allergies.  Home Medications  No current outpatient prescriptions on file.  BP 166/109  Pulse 83  Temp 98 F (36.7 C) (Oral)  Resp 20  Ht 5' 9.5" (1.765 m)  Wt 270 lb (122.471 kg)  BMI 39.30 kg/m2  SpO2 100%  Physical Exam  Nursing note and vitals reviewed. Constitutional: He is oriented to person, place, and time. He appears well-developed and well-nourished.  Non-toxic appearance.  HENT:  Head: Normocephalic.  Right Ear: Tympanic membrane and external ear normal.  Left Ear: Tympanic membrane and external ear normal.  Eyes: EOM and lids are normal. Pupils are equal, round, and reactive to light.  Neck: Normal range of motion. Neck supple. Carotid bruit is not present.  Cardiovascular: Normal rate, regular rhythm, normal heart sounds, intact distal pulses and normal pulses.   Pulmonary/Chest: Breath sounds normal. No respiratory distress.  Abdominal: Soft. Bowel sounds are normal. There is no tenderness. There is no guarding.  Musculoskeletal: Normal range of motion.       llower lumbar area pain and paraspinal tenderness to palpation and change of position. The right hip showes no deformity. No hot area. Good ROM, but with soreness present.  Lymphadenopathy:       Head (right side): No submandibular adenopathy present.       Head (left side): No submandibular adenopathy present.    He has no cervical adenopathy.  Neurological: He is alert and oriented to person, place, and time. He has normal strength. No cranial nerve deficit or sensory deficit.  Skin: Skin is warm and dry.  Psychiatric: He  has a normal mood and affect. His speech is normal.    ED Course  Procedures (including critical care time)  Labs Reviewed - No data to display No results found. Pulse ox 100% on room air. WNL by my interpretation.  No diagnosis found.    MDM  I have reviewed nursing notes, vital signs, and all appropriate lab and imaging results for this patient. Pt has hx  of back pain and right hip replacement. Vital signs stable except for b/p being elevated. Pt not compliant with blood pressure meds and check as he can not see his PCP until he pays an outstanding bill. Plan for Rx of decadron for 6 days, and Norco 5mg #20tabs. Pt to apply heat to back and hip area.       Kathie Dike, [place] 08/17/12 1018

## 2012-08-17 NOTE — ED Notes (Signed)
R hip replacement done 11/2011.  Had no problems until cold weather set in, now has constant 10/10 pain in R hip.  Slight limp noted w/gait.  Has taken Aleve 2 tabs q 6 hours for pain w/out relief.   L side lying  w/pillow betwn. Legs relieves pain enough to sleep.  Could not get in to see PMD b/c they are requesting full payment up front.  Wants referral for new PMD.  Also has < 1cm hard, darkened lesion on L great toe he is concerned about.  No open skin or drainage noted.  2+ pulse in feet bilaterally.

## 2012-08-17 NOTE — ED Notes (Signed)
Patient with no complaints at this time. Respirations even and unlabored. Skin warm/dry. Discharge instructions reviewed with patient at this time. Patient given opportunity to voice concerns/ask questions. Patient discharged at this time and left Emergency Department with steady gait.   

## 2012-10-17 DIAGNOSIS — E1129 Type 2 diabetes mellitus with other diabetic kidney complication: Secondary | ICD-10-CM | POA: Diagnosis not present

## 2012-10-17 DIAGNOSIS — E785 Hyperlipidemia, unspecified: Secondary | ICD-10-CM | POA: Diagnosis not present

## 2012-10-17 DIAGNOSIS — Z125 Encounter for screening for malignant neoplasm of prostate: Secondary | ICD-10-CM | POA: Diagnosis not present

## 2012-10-17 DIAGNOSIS — I10 Essential (primary) hypertension: Secondary | ICD-10-CM | POA: Diagnosis not present

## 2012-10-19 DIAGNOSIS — H5210 Myopia, unspecified eye: Secondary | ICD-10-CM | POA: Diagnosis not present

## 2012-10-19 DIAGNOSIS — H524 Presbyopia: Secondary | ICD-10-CM | POA: Diagnosis not present

## 2012-10-19 DIAGNOSIS — E119 Type 2 diabetes mellitus without complications: Secondary | ICD-10-CM | POA: Diagnosis not present

## 2012-10-19 DIAGNOSIS — H52229 Regular astigmatism, unspecified eye: Secondary | ICD-10-CM | POA: Diagnosis not present

## 2013-02-13 DIAGNOSIS — Z0000 Encounter for general adult medical examination without abnormal findings: Secondary | ICD-10-CM | POA: Diagnosis not present

## 2013-06-22 DIAGNOSIS — B079 Viral wart, unspecified: Secondary | ICD-10-CM | POA: Diagnosis not present

## 2013-06-22 DIAGNOSIS — Z6837 Body mass index (BMI) 37.0-37.9, adult: Secondary | ICD-10-CM | POA: Diagnosis not present

## 2013-06-22 DIAGNOSIS — Z23 Encounter for immunization: Secondary | ICD-10-CM | POA: Diagnosis not present

## 2013-06-22 DIAGNOSIS — E119 Type 2 diabetes mellitus without complications: Secondary | ICD-10-CM | POA: Diagnosis not present

## 2013-10-13 ENCOUNTER — Emergency Department (HOSPITAL_COMMUNITY)
Admission: EM | Admit: 2013-10-13 | Discharge: 2013-10-13 | Disposition: A | Discharge status: Home / Self Care | Payer: Medicare Other | Attending: Emergency Medicine | Admitting: Emergency Medicine

## 2013-10-13 ENCOUNTER — Encounter (HOSPITAL_COMMUNITY): Payer: Self-pay | Admitting: Emergency Medicine

## 2013-10-13 ENCOUNTER — Emergency Department (HOSPITAL_COMMUNITY): Payer: Medicare Other

## 2013-10-13 DIAGNOSIS — F17200 Nicotine dependence, unspecified, uncomplicated: Secondary | ICD-10-CM | POA: Diagnosis not present

## 2013-10-13 DIAGNOSIS — Z760 Encounter for issue of repeat prescription: Secondary | ICD-10-CM | POA: Diagnosis not present

## 2013-10-13 DIAGNOSIS — R05 Cough: Secondary | ICD-10-CM | POA: Diagnosis not present

## 2013-10-13 DIAGNOSIS — I10 Essential (primary) hypertension: Secondary | ICD-10-CM | POA: Diagnosis not present

## 2013-10-13 DIAGNOSIS — R059 Cough, unspecified: Secondary | ICD-10-CM | POA: Diagnosis not present

## 2013-10-13 DIAGNOSIS — G8929 Other chronic pain: Secondary | ICD-10-CM | POA: Insufficient documentation

## 2013-10-13 DIAGNOSIS — J069 Acute upper respiratory infection, unspecified: Secondary | ICD-10-CM | POA: Diagnosis not present

## 2013-10-13 DIAGNOSIS — E119 Type 2 diabetes mellitus without complications: Secondary | ICD-10-CM | POA: Diagnosis not present

## 2013-10-13 DIAGNOSIS — Z79899 Other long term (current) drug therapy: Secondary | ICD-10-CM | POA: Insufficient documentation

## 2013-10-13 HISTORY — DX: Other chronic pain: G89.29

## 2013-10-13 HISTORY — DX: Pain in unspecified hip: M25.559

## 2013-10-13 HISTORY — DX: Dorsalgia, unspecified: M54.9

## 2013-10-13 MED ORDER — BENZONATATE 100 MG PO CAPS: 100.0000 mg | ORAL_CAPSULE | Freq: Three times a day (TID) | ORAL | Status: DC | PRN

## 2013-10-13 MED ORDER — OXYCODONE-ACETAMINOPHEN 5-325 MG PO TABS: ORAL_TABLET | ORAL | Status: DC

## 2013-10-13 MED ORDER — ALBUTEROL SULFATE HFA 108 (90 BASE) MCG/ACT IN AERS
2.0000 | INHALATION_SPRAY | RESPIRATORY_TRACT | Status: AC
  Administered 2013-10-13: 2 via RESPIRATORY_TRACT
  Filled 2013-10-13: qty 6.7

## 2013-10-13 NOTE — ED Provider Notes (Signed)
CSN: [id number]     Arrival date & time 10/13/13  1708 History   First MD Initiated Contact with Patient 10/13/13 1744     Chief Complaint  Patient presents with  . Cough  . Medication Refill    HPI Pt was seen at 1750.  Per pt, c/o gradual onset and persistence of constant runny/stuffy nose, sinus congestion, and cough for the past 2-3 days.  Denies fevers, no sore throat, no rash, no CP/SOB, no N/V/D, no abd pain. Pt also requesting a refill of his narcotic pain medication for his longstanding chronic hip pain because he "ran out." Denies any change in his usual pain pattern. Denies injury, no rash, no focal motor weakness, no tingling/numbness in extremity.    Past Medical History  Diagnosis Date  . Diabetes mellitus   . Hypertension   . Chronic hip pain   . Chronic back pain    Past Surgical History  Procedure Laterality Date  . Joint replacement    . Cardiac surgery      History  Substance Use Topics  . Smoking status: Current Every Day Smoker    Types: Cigarettes  . Smokeless tobacco: Not on file  . Alcohol Use: Yes    Review of Systems ROS: Statement: All systems negative except as marked or noted in the HPI; Constitutional: Negative for fever and chills. ; ; Eyes: Negative for eye pain, redness and discharge. ; ; ENMT: Negative for ear pain, hoarseness, sore throat. +nasal congestion, rhinorrhea, sinus pressure. ; ; Cardiovascular: Negative for chest pain, palpitations, diaphoresis, dyspnea and peripheral edema. ; ; Respiratory: +cough. Negative for wheezing and stridor. ; ; Gastrointestinal: Negative for nausea, vomiting, diarrhea, abdominal pain, blood in stool, hematemesis, jaundice and rectal bleeding. . ; ; Genitourinary: Negative for dysuria, flank pain and hematuria. ; ; Musculoskeletal: Negative for back pain and neck pain. Negative for swelling and trauma.; ; Skin: Negative for pruritus, rash, abrasions, blisters, bruising and skin lesion.; ; Neuro: Negative for  headache, lightheadedness and neck stiffness. Negative for weakness, altered level of consciousness , altered mental status, extremity weakness, paresthesias, involuntary movement, seizure and syncope.       Allergies  Review of patient's allergies indicates no known allergies.  Home Medications   Current Outpatient Rx  Name  Route  Sig  Dispense  Refill  . dexamethasone (DECADRON) 6 MG tablet      1 po daily with food   6 tablet   0   . lisinopril-hydrochlorothiazide (PRINZIDE,ZESTORETIC) 10-12.5 MG per tablet   Oral   Take 1 tablet by mouth daily.         . metFORMIN (GLUCOPHAGE) 500 MG tablet   Oral   Take 500 mg by mouth 2 (two) times daily with a meal.         . naproxen sodium (ALEVE) 220 MG tablet   Oral   Take 220 mg by mouth 4 (four) times daily as needed. Pain.          BP 171/93  Pulse 93  Temp(Src) 100.1 F (37.8 C) (Oral)  Resp 18  Ht 5' 10.5" (1.791 m)  Wt 270 lb (122.471 kg)  BMI 38.18 kg/m2  SpO2 97% Physical Exam 1755: Physical examination:  Nursing notes reviewed; Vital signs and O2 SAT reviewed;  Constitutional: Well developed, Well nourished, Well hydrated, In no acute distress; Head:  Normocephalic, atraumatic; Eyes: EOMI, PERRL, No scleral icterus; ENMT: TM's clear bilat. +edemetous nasal turbinates bilat with clear rhinorrhea. Mouth  and pharynx without lesions. No tonsillar exudates. No intra-oral edema. No submandibular or sublingual edema. No hoarse voice, no drooling, no stridor. No pain with manipulation of larynx. Mouth and pharynx normal, Mucous membranes moist; Neck: Supple, Full range of motion, No lymphadenopathy; Cardiovascular: Regular rate and rhythm, No murmur, rub, or gallop; Respiratory: Breath sounds coarse & equal bilaterally, No wheezes.  Speaking full sentences with ease, Normal respiratory effort/excursion; Chest: Nontender, Movement normal; Abdomen: Soft, Nontender, Nondistended, Normal bowel sounds; Genitourinary: No CVA  tenderness; Extremities: Pulses normal, Pelvis stable. No tenderness, No edema, No calf edema or asymmetry.; Neuro: AA&Ox3, Major CN grossly intact.  Speech clear. No gross focal motor or sensory deficits in extremities. Climbs on and off stretcher easily by himself. Gait steady.; Skin: Color normal, Warm, Dry.   ED Course  Procedures   EKG Interpretation   None       MDM  MDM Reviewed: previous chart, nursing note and vitals Interpretation: x-ray     Dg Chest 2 View 10/13/2013   CLINICAL DATA:  Cough  EXAM: CHEST  2 VIEW  COMPARISON:  PA and lateral chest of June 08, 2011  FINDINGS: The right hemidiaphragm is higher than the left. This is a stable finding. The knee right lung is mildly hypoinflated. There are coarse perihilar interstitial markings bilaterally which allowing for differences in positioning are not clearly new. The cardiac silhouette is normal in size. The mediastinum is normal in width. The pulmonary vascularity is not engorged. There is no pleural effusion. There is mild tortuosity of the descending thoracic aorta.  IMPRESSION: There is no focal pneumonia. I cannot exclude minimal perihilar subsegmental atelectasis inferiorly as might be seen with acute bronchitis.   Electronically Signed   By: David  [place]   On: 10/13/2013 17:55    1855:  Pt states he feels better after MDI. No infiltrate on CXR. Will tx symptomatically for URI at this time.  Pt has gotten himself dressed and is sitting in a chair in the room requesting to be discharged. Dx and testing d/w pt and family.  Questions answered.  Verb understanding, agreeable to d/c home with outpt f/u.   Laray Anger, DO 10/16/13 1434

## 2013-10-13 NOTE — Discharge Instructions (Signed)
°Emergency Department Resource Guide °1) Find a Doctor and Pay Out of Pocket °Although you won't have to find out who is covered by your insurance plan, it is a good idea to ask around and get recommendations. You will then need to call the office and see if the doctor you have chosen will accept you as a new patient and what types of options they offer for patients who are self-pay. Some doctors offer discounts or will set up payment plans for their patients who do not have insurance, but you will need to ask so you aren't surprised when you get to your appointment. ° °2) Contact Your Local Health Department °Not all health departments have doctors that can see patients for sick visits, but many do, so it is worth a call to see if yours does. If you don't know where your local health department is, you can check in your phone book. The CDC also has a tool to help you locate your state's health department, and many state websites also have listings of all of their local health departments. ° °3) Find a Walk-in Clinic °If your illness is not likely to be very severe or complicated, you may want to try a walk in clinic. These are popping up all over the country in pharmacies, drugstores, and shopping centers. They're usually staffed by nurse practitioners or physician assistants that have been trained to treat common illnesses and complaints. They're usually fairly quick and inexpensive. However, if you have serious medical issues or chronic medical problems, these are probably not your best option. ° °No Primary Care Doctor: °- Call [facility] at  832-8000 - they can help you locate a primary care doctor that  accepts your insurance, provides certain services, etc. °- Physician Referral Service- 1-800-533-3463 ° °Chronic Pain Problems: °Organization         Address  Phone   Notes  °[facility]  (336) 297-2271 Patients need to be referred by their primary care doctor.  ° °Medication  Assistance: °Organization         Address  Phone   Notes  °[place] [facility] [address] °[place], [place] [address] (336) 641-8030 --Must be a resident of [place] °-- Must have NO insurance coverage whatsoever (no Medicaid/ Medicare, etc.) °-- The pt. MUST have a primary care doctor that directs their care regularly and follows them in the community °  °[facility]  (866) 331-1348   °[employer]  (888) 892-1162   ° °Agencies that provide inexpensive medical care: °Organization         Address  Phone   Notes  °[facility]  (336) 832-8035   °[facility]    (336) 832-7272   °[facility] [address] °[place], [place] [address] (336) 832-4777   °[facility] [address], °[place] (336) 271-4999   °[facility]    (336) 373-0678   °[facility]    (336) 272-1050   °[facility] ° [address], [place] Phone:  (336) 832-4444, Fax:  (336) 832-4440 Hours of Operation:  9 am - 6 pm, M-F.  Also accepts Medicaid/Medicare and self-pay.  °[facility] ° [address], [place] Phone: (336) 832-3150, Fax: (336) [phone]. Hours of Operation:  8:30 am - 5:30 pm, M-F.  Also accepts Medicaid and self-pay.  °[facility] [place] [address], [place] Phone: (336) 878-6027   °[facility] [address], [place], [place] (336)723-1848, Ext. 123 Mondays & Thursdays: 7-9 AM.  First 15 patients are seen on a first come, first serve basis. °  ° °Medicaid-accepting [place] Providers: ° °Organization         Address  Phone   Notes  °[facility] [address], [place] (336) 641-2100 Also accepts self-pay patients.  °[facility] [address], [place] ° (336) 856-9996   °[facility] [address], [place]  (336) 288-8857   °[facility] [address] [place] [address], [place] (336) 299-7000   °Veita Bland [address], [place]  ° (336) 373-1557 Only accepts [place] Access Medicaid patients after they have their name applied to their card.  ° °Self-Pay (no insurance) in [place]: ° °Organization         Address  Phone   Notes  °Sickle Cell Patients, [facility] [address], [place] (336) 832-1970   °[facility] [address], [place] (336) 832-4400   °[facility] ° [address], [place] (336) 992-4800   °[facility]/Dr. Osei-Bonsu ° [address] [place] [address], [place] or [address], [place] (336) 841-8500 Phone number for both [place] and [place] locations is the same.  °[facility] [address], [place] (336) 299-0000   °[facility] [address] [place] [address], [place] or [address] (336) 852-7530 °(336) 878-2260   °[facility] [address], [place] (336) 350-1642, phone; (336) 294-5005, fax Sees patients 1st and 3rd Saturday of every month.  Must not qualify for public or private insurance (i.e. Medicaid, Medicare, [place] Health Choice, Veterans' Benefits) • Household income should be no more than 200% of the poverty level •The clinic cannot treat you if you are pregnant or think you are pregnant • Sexually transmitted diseases are not treated at the clinic.  ° ° °Dental Care: °Organization         Address  Phone  Notes  °[place] [facility] [address], [place] (336) 641-6152 Accepts children up to age 21 who are enrolled in Medicaid or [place] Health Choice; pregnant women with a Medicaid card; and children who have applied for Medicaid or [place] Health Choice, but were declined, whose parents can pay a reduced fee at time of service.  °[place]  [facility] [place]  [address], [place] (336) 641-7733 Accepts children up to age 21 who are enrolled in Medicaid or [place] Health Choice; pregnant women with a Medicaid card; and children who have applied for Medicaid or [place] Health Choice, but were declined, whose parents can pay a reduced fee at time of service.  °[facility] ° [address], [place] (336) 641-4533 Patients are seen by appointment only. Walk-ins are not accepted. [facility] will see patients 18 years of age and older. °Monday - Tuesday (8am-5pm) °Most Wednesdays (8:30-5pm) °$30 per visit, cash only  °[facility] ° [address], [place] (336) 641-4533 Patients are seen by appointment only. Walk-ins are not accepted. [facility] will see patients 18 years of age and older. °One   Wednesday Evening (Monthly: Volunteer Based).  $30 per visit, cash only  °[facility]  (919) 537-3737 for adults; Children under age 4, call Graduate Pediatric Dentistry at (919) [phone]. Children aged 4-14, please call (919) [phone] to request a pediatric application. ° Dental services are provided in all areas of dental care including fillings, crowns and bridges, complete and partial dentures, implants, gum treatment, root canals, and extractions. Preventive care is also provided. Treatment is provided to both adults and children. °Patients are selected via a lottery and there is often a waiting list. °  °[facility] [address], °[place] ° (336) 763-8833 [email or web address] °  °[facility] [address], [place], [place] (336)723-1848, Ext. 123 Second and Fourth Thursday of each month, opens at 6:30 AM; Clinic ends at 9 AM.  Patients are seen on a first-come first-served basis, and a limited number are seen during each clinic.  ° °[facility] ° [address], [place], [place] (336) 723-7904    Eligibility Requirements °You must have lived in [place], [place], or [place] counties for at least the last three months. °  You cannot be eligible for state or federal sponsored healthcare insurance, including Veterans Administration, Medicaid, or Medicare. °  You generally cannot be eligible for healthcare insurance through your employer.  °  How to apply: °Eligibility screenings are held every Tuesday and Wednesday afternoon from 1:00 pm until 4:00 pm. You do not need an appointment for the interview!  °[facility] [address], [place], [place] 336-631-2330   °[facility]  336-342-8273   °[facility]  336-703-3100   °[facility]  336-570-6415   ° °Behavioral Health Resources in the Community: °Intensive Outpatient Programs °Organization         Address  Phone  Notes  °[facility] [address], [place], [place] 336-878-6098   °[facility] [address], [place], [place] 336-832-9800   °[facility] [address], [place], [place] ° 336-882-2125   °[facility] [address],  °[place], [place] 1-800-853-5163 or 336-641-4981   °Substance Abuse Resources °Organization         Address  Phone  Notes  °[facility]  336-882-2125   °[facility]  336-784-9470   °[facility]  336-285-9073   °[facility]  336-845-3988   °Residential & Outpatient Substance Abuse Program  1-800-659-3381   °Psychological Services °Organization         Address  Phone  Notes  °[facility]  336- 832-9600   °[facility]  336- 378-7881   °[facility] [address], [place] 1-800-853-5163 or 336-641-4981   ° °Mobile Crisis Teams °Organization         Address  Phone  Notes  °[facility], Mobile Crisis Care Unit  1-877-626-1772   °Assertive °[facility] ° [address]  [place], [place] 336-834-9664   °Sharon DeEsch [address] °[place] 336-554-5454   ° °Self-Help/Support Groups °Organization         Address  Phone             Notes  °[facility]  - variety of support groups  336- 373-1402 Call for more information  °Narcotics Anonymous (NA), [facility] [address], °[place]  2 meetings at this location  ° °  Residential Treatment Programs Organization         Address  Phone  Notes  [facility] [address],    [place]  [phone]   [facility]  [address], [place] [phone], [place], [place] [phone]   [facility] [address], [place] [phone]) [phone] Admissions: 8am-3pm M-F  [facility] [address]    [place], [place] [phone]   [facility] [address], [place], [place] [phone]   [facility] [address]  [place], [place] [phone]   [facility] Intensive Outpatient [address], [place], [place] [phone]   [facility] ([facility] [address]  [place], [place] [phone] or [phone]   [facility] ([facility] [address] [place], [place] [phone] Accepts Medicaid  [facility] [address]  [place] [phone]) [phone] Substance Abuse/Addiction Treatment   [facility]         Address  Phone  Notes  [facility]  (480)355-7794(888) [phone]   Angie FavaJulie Brannon, PhD [address] [place], [place]   [phone](336) [phone] or ([phone](336) ([phone]   [facility]   [address] [place], [place] ([phone](336) [phone]   [facility] [address], [place], [place] [phone](336) [phone] Insurance/Medicaid/sponsorship through [facility]  [facility] [address]                                    [place], [place] [phone](336) [phone] Therapy/tele-psych/case    [facility] [address]   [place], [place] [phone](336) [phone]    Dr. Lolly MustacheArfeen  [phone](336) [phone]   [facility] [place]  [employer] [facility] 1) [address],  2) [address], [place] 3)  [address], [place] [phone](336) [phone](336) [phone]  [phone](336) [phone]   [facility] Child [facility] Hot[facility] 7242932507(336) 734-073-5080 or [phone](336) 3647367089 (After Hours)       Take the prescriptions as directed.  Use your albuterol inhaler (2 to 4 puffs) every 4 hours for the next 7 days, then as needed for cough, wheezing, or shortness of breath.  Take over the counter decongestant (such as sudafed), as directed on packaging, for the next week.  Use over the counter normal saline nasal spray, as instructed in the Emergency Department, several times per day for the next 2 weeks.  Call your regular medical doctor on Monday to schedule a follow up appointment in the next 2 days.  Return to the Emergency Department immediately if worsening.

## 2013-10-13 NOTE — ED Notes (Signed)
C/o productive cough of white phlegm , denies fever or N/V/D, denies SOB, pain to chest with coughing only

## 2013-10-13 NOTE — ED Notes (Signed)
Pt verbalized understanding of no driving after taking percocet during to med causes drowsiness and also made aware same med can cause constipation

## 2013-10-13 NOTE — ED Notes (Signed)
Uri symptoms with productive cough

## 2014-09-23 ENCOUNTER — Encounter (HOSPITAL_COMMUNITY): Payer: Self-pay | Admitting: Emergency Medicine

## 2014-09-23 ENCOUNTER — Emergency Department (HOSPITAL_COMMUNITY)
Admission: EM | Admit: 2014-09-23 | Discharge: 2014-09-23 | Disposition: A | Discharge status: Home / Self Care | Payer: Medicare Other | Attending: Emergency Medicine | Admitting: Emergency Medicine

## 2014-09-23 ENCOUNTER — Emergency Department (HOSPITAL_COMMUNITY): Payer: Medicare Other

## 2014-09-23 DIAGNOSIS — R0602 Shortness of breath: Secondary | ICD-10-CM | POA: Diagnosis not present

## 2014-09-23 DIAGNOSIS — Z79899 Other long term (current) drug therapy: Secondary | ICD-10-CM | POA: Insufficient documentation

## 2014-09-23 DIAGNOSIS — Z87891 Personal history of nicotine dependence: Secondary | ICD-10-CM | POA: Insufficient documentation

## 2014-09-23 DIAGNOSIS — R079 Chest pain, unspecified: Secondary | ICD-10-CM | POA: Insufficient documentation

## 2014-09-23 DIAGNOSIS — R12 Heartburn: Secondary | ICD-10-CM | POA: Diagnosis not present

## 2014-09-23 DIAGNOSIS — E1165 Type 2 diabetes mellitus with hyperglycemia: Secondary | ICD-10-CM | POA: Diagnosis not present

## 2014-09-23 DIAGNOSIS — G8929 Other chronic pain: Secondary | ICD-10-CM | POA: Insufficient documentation

## 2014-09-23 DIAGNOSIS — R0789 Other chest pain: Secondary | ICD-10-CM | POA: Diagnosis not present

## 2014-09-23 DIAGNOSIS — I10 Essential (primary) hypertension: Secondary | ICD-10-CM | POA: Diagnosis not present

## 2014-09-23 DIAGNOSIS — R739 Hyperglycemia, unspecified: Secondary | ICD-10-CM

## 2014-09-23 LAB — CBC
HEMATOCRIT: 44.8 % (ref 39.0–52.0)
HEMOGLOBIN: 14.8 g/dL (ref 13.0–17.0)
MCH: 30.3 pg (ref 26.0–34.0)
MCHC: 33 g/dL (ref 30.0–36.0)
MCV: 91.6 fL (ref 78.0–100.0)
Platelets: 225 10*3/uL (ref 150–400)
RBC: 4.89 MIL/uL (ref 4.22–5.81)
RDW: 14.4 % (ref 11.5–15.5)
WBC: 7.8 10*3/uL (ref 4.0–10.5)

## 2014-09-23 LAB — BASIC METABOLIC PANEL
Anion gap: 6 (ref 5–15)
BUN: 16 mg/dL (ref 6–23)
CALCIUM: 9.1 mg/dL (ref 8.4–10.5)
CO2: 27 mmol/L (ref 19–32)
CREATININE: 1.07 mg/dL (ref 0.50–1.35)
Chloride: 107 mEq/L (ref 96–112)
GFR calc Af Amer: 88 mL/min — ABNORMAL LOW (ref >90)
GFR calc non Af Amer: 76 mL/min — ABNORMAL LOW (ref >90)
GLUCOSE: 171 mg/dL — AB (ref 70–99)
Potassium: 3.9 mmol/L (ref 3.5–5.1)
SODIUM: 140 mmol/L (ref 135–145)

## 2014-09-23 LAB — TROPONIN I: TROPONIN I: 0.03 ng/mL (ref <0.031)

## 2014-09-23 MED ORDER — LISINOPRIL-HYDROCHLOROTHIAZIDE 10-12.5 MG PO TABS: 1.0000 | ORAL_TABLET | Freq: Every day | ORAL | Status: DC

## 2014-09-23 MED ORDER — FAMOTIDINE 20 MG PO TABS: 20.0000 mg | ORAL_TABLET | Freq: Two times a day (BID) | ORAL | Status: DC

## 2014-09-23 MED ORDER — METFORMIN HCL 500 MG PO TABS: 500.0000 mg | ORAL_TABLET | Freq: Two times a day (BID) | ORAL | Status: DC

## 2014-09-23 NOTE — ED Provider Notes (Signed)
CSN: [id number]     Arrival date & time 09/23/14  1225 History   First MD Initiated Contact with Patient 09/23/14 1456     Chief Complaint  Patient presents with  . Shortness of Breath     (Consider location/radiation/quality/duration/timing/severity/associated sxs/prior Treatment) Patient is a 58 y.o. male presenting with shortness of breath. The history is provided by the patient.  Shortness of Breath Associated symptoms: chest pain and diaphoresis   Associated symptoms: no abdominal pain, no fever, no headaches, no rash and no vomiting    patient with a complaint of a burning sensation in the sternal part of the chest started yesterday. Pain constant associated with some diaphoresis no nausea no vomiting. Some exertional shortness of breath but not shortness of breath at rest. Patient has a history of diabetes and hypertension as been out of meds for several months. Substernal burning described as indigestion by the patient. Nonradiating. 3 out of 10 discomfort. Patient did have an episode of diaphoresis with the chest discomfort yesterday.  Past Medical History  Diagnosis Date  . Diabetes mellitus   . Hypertension   . Chronic hip pain   . Chronic back pain    Past Surgical History  Procedure Laterality Date  . Joint replacement    . Cardiac surgery     History reviewed. No pertinent family history. History  Substance Use Topics  . Smoking status: Former Smoker    Types: Cigarettes  . Smokeless tobacco: Not on file  . Alcohol Use: Yes     Comment: occ    Review of Systems  Constitutional: Positive for diaphoresis and fatigue. Negative for fever.  HENT: Negative for congestion.   Eyes: Negative for visual disturbance.  Respiratory: Positive for shortness of breath.   Cardiovascular: Positive for chest pain.  Gastrointestinal: Negative for nausea, vomiting and abdominal pain.  Genitourinary: Negative for dysuria.  Musculoskeletal: Negative for back pain.  Skin:  Negative for rash.  Neurological: Negative for headaches.  Hematological: Does not bruise/bleed easily.  Psychiatric/Behavioral: Negative for confusion.      Allergies  Review of patient's allergies indicates no known allergies.  Home Medications   Prior to Admission medications   Medication Sig Start Date End Date Taking? Authorizing Provider  atorvastatin (LIPITOR) 20 MG tablet Take 20 mg by mouth daily.   Yes Historical Provider, MD  benzonatate (TESSALON) 100 MG capsule Take 1 capsule (100 mg total) by mouth 3 (three) times daily as needed for cough. Patient not taking: Reported on 09/23/2014 10/13/13   Samuel Jester, DO  famotidine (PEPCID) 20 MG tablet Take 1 tablet (20 mg total) by mouth 2 (two) times daily. 09/23/14   Vanetta Mulders, MD  lisinopril-hydrochlorothiazide (PRINZIDE) 10-12.5 MG per tablet Take 1 tablet by mouth daily. 09/23/14   Vanetta Mulders, MD  metFORMIN (GLUCOPHAGE) 500 MG tablet Take 1 tablet (500 mg total) by mouth 2 (two) times daily with a meal. 09/23/14   Vanetta Mulders, MD  oxyCODONE-acetaminophen (PERCOCET/ROXICET) 5-325 MG per tablet 1 or 2 tabs PO q6h prn pain Patient not taking: Reported on 09/23/2014 10/13/13   Samuel Jester, DO   BP 153/91 mmHg  Pulse 68  Temp(Src) 97.8 F (36.6 C) (Oral)  Resp 20  Ht 5' 10.5" (1.791 m)  Wt 270 lb (122.471 kg)  BMI 38.18 kg/m2  SpO2 99% Physical Exam  Constitutional: He is oriented to person, place, and time. He appears well-developed and well-nourished. No distress.  HENT:  Head: Normocephalic and atraumatic.  Mouth/Throat: Oropharynx  is clear and moist.  Eyes: Conjunctivae and EOM are normal. Pupils are equal, round, and reactive to light.  Neck: Normal range of motion. Neck supple.  Cardiovascular: Normal rate, regular rhythm and normal heart sounds.   Pulmonary/Chest: Effort normal and breath sounds normal. No respiratory distress.  Abdominal: Soft. Bowel sounds are normal. There is no  tenderness.  Musculoskeletal: Normal range of motion. He exhibits no edema.  Neurological: He is alert and oriented to person, place, and time. No cranial nerve deficit. He exhibits normal muscle tone. Coordination normal.  Skin: Skin is warm. No rash noted.  Nursing note and vitals reviewed.   ED Course  Procedures (including critical care time) Labs Review Labs Reviewed  BASIC METABOLIC PANEL - Abnormal; Notable for the following:    Glucose, Bld 171 (*)    GFR calc non Af Amer 76 (*)    GFR calc Af Amer 88 (*)    All other components within normal limits  CBC  TROPONIN I   Results for orders placed or performed during the hospital encounter of 09/23/14  CBC  Result Value Ref Range   WBC 7.8 4.0 - 10.5 K/uL   RBC 4.89 4.22 - 5.81 MIL/uL   Hemoglobin 14.8 13.0 - 17.0 g/dL   HCT 16.144.8 09.639.0 - 04.552.0 %   MCV 91.6 78.0 - 100.0 fL   MCH 30.3 26.0 - 34.0 pg   MCHC 33.0 30.0 - 36.0 g/dL   RDW 40.914.4 81.111.5 - 91.415.5 %   Platelets 225 150 - 400 K/uL  Basic metabolic panel  Result Value Ref Range   Sodium 140 135 - 145 mmol/L   Potassium 3.9 3.5 - 5.1 mmol/L   Chloride 107 96 - 112 mEq/L   CO2 27 19 - 32 mmol/L   Glucose, Bld 171 (H) 70 - 99 mg/dL   BUN 16 6 - 23 mg/dL   Creatinine, Ser 7.821.07 0.50 - 1.35 mg/dL   Calcium 9.1 8.4 - 95.610.5 mg/dL   GFR calc non Af Amer 76 (L) >90 mL/min   GFR calc Af Amer 88 (L) >90 mL/min   Anion gap 6 5 - 15  Troponin I (MHP)  Result Value Ref Range   Troponin I 0.03 <0.031 ng/mL     Imaging Review Dg Chest 2 View  09/23/2014   CLINICAL DATA:  Shortness of breath, heartburn, acid reflux.  EXAM: CHEST  2 VIEW  COMPARISON:  10/13/2013.  FINDINGS: Trachea midline. Heart size normal. Lungs are clear. No pleural fluid.  IMPRESSION: No acute findings.   Electronically Signed   By: Leanna BattlesMelinda  Blietz M.D.   On: 09/23/2014 14:18     EKG Interpretation   Date/Time:  Monday September 23 2014 12:35:58 EST Ventricular Rate:  75 PR Interval:  140 QRS Duration:  76 QT Interval:  358 QTC Calculation: 399 R Axis:   50 Text Interpretation:  Normal sinus rhythm Cannot rule out Anterior infarct  , age undetermined T wave abnormality, consider inferior ischemia Abnormal  ECG No old tracing to compare Confirmed by WARD,  DO, KRISTEN ([id number]) on  09/23/2014 12:43:00 PM Also confirmed by Deretha EmoryZACKOWSKI  MD, Compton Brigance [phone]([id number])  on  09/23/2014 3:24:25 PM      MDM   Final diagnoses:  Chest pain, unspecified chest pain type  Hyperglycemia  Essential hypertension    Workup for the chest pain without any significant findings. Clinically no evidence of tachycardia or hypoxia. Labs without significant amount is does have a history of diabetes  currently on meds blood sugar is elevated. Blood pressure is also elevated. No evidence of pneumonia pneumothorax or pulmonary edema on the chest x-ray. Will restart his hypertensive meds and diabetic meds. Workup for chest pain that started yesterday and has been constant more burning sounds more like reflux EKG without acute changes. Troponin was negative.    Vanetta MuldersScott Safi Culotta, MD 09/23/14 [phone]

## 2014-09-23 NOTE — Discharge Instructions (Signed)
Restart her diabetic medicine and you're hypertensive medicine. Prescriptions provided. Take the Pepcid as directed for the next 2 weeks. Return for any new or worse symptoms. Resource guide provided below to help you find a regular doctor.    Emergency Department Resource Guide 1) Find a Doctor and Pay Out of Pocket Although you won't have to find out who is covered by your insurance plan, it is a good idea to ask around and get recommendations. You will then need to call the office and see if the doctor you have chosen will accept you as a new patient and what types of options they offer for patients who are self-pay. Some doctors offer discounts or will set up payment plans for their patients who do not have insurance, but you will need to ask so you aren't surprised when you get to your appointment.  2) Contact Your Local Health Department Not all health departments have doctors that can see patients for sick visits, but many do, so it is worth a call to see if yours does. If you don't know where your local health department is, you can check in your phone book. The CDC also has a tool to help you locate your state's health department, and many state websites also have listings of all of their local health departments.  3) Find a Walk-in Clinic If your illness is not likely to be very severe or complicated, you may want to try a walk in clinic. These are popping up all over the country in pharmacies, drugstores, and shopping centers. They're usually staffed by nurse practitioners or physician assistants that have been trained to treat common illnesses and complaints. They're usually fairly quick and inexpensive. However, if you have serious medical issues or chronic medical problems, these are probably not your best option.  No Primary Care Doctor: - Call Health Connect at  [phone] - they can help you locate a primary care doctor that  accepts your insurance, provides certain services,  etc. - Physician Referral Service- [phone]  Chronic Pain Problems: Organization         Address  Phone   Notes  [facility]  [phone] Patients need to be referred by their primary care doctor.   Medication Assistance: Organization         Address  Phone   Notes  [facility] [address] [place], [place] [address] [phone]) [phone] --Must be a resident of [facility] [place] -- Must have NO insurance coverage whatsoever (no Medicaid/ Medicare, etc.) -- The pt. MUST have a primary care doctor that directs their care regularly and follows them in the community   MedAssist  [phone]   [employer]  [phone]    Agencies that provide inexpensive medical care: Organization         Address  Phone   Notes  [facility]  [phone]   [facility]    [phone]) [phone]   [facility] [address] [place], [place] [address] [phone]) [phone]   [facility] [address], [place] [phone]   Planned Parenthood    [phone]   [facility]    [phone]   [facility]  [address], [place] Phone:  [phone], Fax:  [phone]) [phone] Hours of Operation:  9 am - 6 pm, M-F.  Also accepts Medicaid/Medicare and self-pay.  [facility] [address], [address], [place] Phone: [phone], Fax: [phone]. Hours of Operation:  8:30 am - 5:30 pm, M-F.  Also accepts Medicaid and self-pay.  [facility] [address] [place] Phone: [phone]   [place], [place], [place] [phone], Ext. [phone] Mondays & Thursdays: 7-9 AM.  First 15 patients are seen on a first come, first serve basis.    [place] Providers:  Organization         Address  Phone   Notes  [facility] [address], [place] [phone] Also accepts self-pay patients.  [facility] [address], [place]  [phone]   [place], [address], [place] [phone]   [facility] [address], [place] [phone]   Lucianne Lei [address], [place]   [phone] Only accepts [place] Access [place] patients after they have their name applied to their card.   Self-Pay (no insurance) in [facility]:  Organization         Address  Phone   Notes  Sickle Cell Patients, [facility] [place] [phone]   [facility] [place] [phone]   [facility] [place]  [place], [place], [place] [phone]   [facility]/Dr. Osei-Bonsu  [address] [place] or [address], [place] [phone] Phone number for both [place] and [place] locations is the same.  Urgent Medical and [facility] [address], [place] [phone]   [facility] [address], [place] or [address] [phone]   [facility] [address] [place] [phone], phone; [phone], fax Sees patients 1st and 3rd Saturday of every month.  Must not qualify for public or private insurance (i.e. Medicaid, Medicare, [place] Health Choice, Veterans' Benefits)  Household income should be no more than 200% of the poverty level The clinic cannot treat you if you are pregnant or think you are pregnant  Sexually transmitted diseases are not treated at the clinic.    Dental Care: Organization         Address  Phone  Notes  [facility] [place] [phone] Accepts children up to  age 78 who are enrolled in [place] or [place]; pregnant women with a Medicaid card; and children who have applied for Medicaid or [place] Health Choice, but were declined, whose parents can pay a reduced fee at time of service.  [facility] of [facility]  [address], [place] [phone] Accepts children up to age 39 who are enrolled in [place] or [place]; pregnant women with a Medicaid card; and children who have applied for Medicaid or [place] Health Choice, but were declined, whose parents can pay a reduced fee at time of service.  [facility] Access PROGRAM  [place] [phone] Patients are seen by appointment only. Walk-ins are not accepted. [place] will see patients 78 years of age and older. Monday - Tuesday (8am-5pm) Most Wednesdays (8:30-5pm) $30 per  visit, cash only  [facility] Adult [employer] PROGRAM  [address], [facility] [phone] Patients are seen by appointment only. Walk-ins are not accepted. [place] will see patients 25 years of age and older. One Wednesday Evening (Monthly: Volunteer Based).  $30 per visit, cash only  [place]  [phone] for adults; Children under age 88, call Graduate Pediatric Dentistry at [phone]. Children aged 53-14, please call [phone] to request a pediatric application.  Dental services are provided in all areas of dental care including fillings, crowns and bridges, complete and partial dentures, implants, gum treatment, root canals, and extractions. Preventive care is also provided. Treatment is provided to both adults and children. Patients are selected via a lottery and there is often a waiting list.   [facility] [address] [place]  [phone] [email or web address]   [facility] [address] [place], [place] [phone], Ext. [phone] Second and Fourth Thursday of  each month, opens at 6:30 AM; Clinic ends at 9 AM.  Patients are seen on a first-come first-served basis, and a limited number are seen during each clinic.   [facility]  [address] Hillard Danker [place], [place] [phone]   Eligibility Requirements You must have lived in [place], [place], or [place] counties for at least the last three months.   You cannot be eligible for state or federal sponsored [employer], including [employer], [place], or [employer].   You generally cannot be eligible for healthcare insurance through your employer.    How to apply: Eligibility screenings are held every Tuesday and Wednesday afternoon from 1:00 pm until 4:00 pm. You do not need an appointment for the interview!  [facility] [address], [place], [place]   [place]  [place] Department  [place]  [phone]    Behavioral Health Resources in the Community: Intensive Outpatient Programs Organization         Address  Phone  Notes  [place]. [address] [place], [place] [phone]   [facility] [address] [place], [place]   ADS: Alcohol & Drug Svcs [address] [place], [place]   [place] [address],  [place], [place] or [phone]   Substance Abuse Resources Organization         Address  Phone  Notes  Alcohol and Drug Services  [phone]   [place]  [phone]   [facility] [place]   Chinita Pester  [phone]   Residential & Outpatient Substance Abuse Program  [phone]   Psychological Services Organization         Address  Phone  Notes  [facility]  [place]  [phone]   [place] [address],  [place] or [phone]    Mobile Crisis Teams Organization         Address  Phone  Notes  [facility], Mobile Crisis Care Unit  [phone]   [facility]  [address] [place], [place]   [place] [address], [place] [phone]    Self-Help/Support Groups Organization         Address  Phone  Notes  [facility] [place] - variety of support groups  [place] Call for more information  Narcotics Anonymous (NA), [facility] [address], [employer] [place]  2 meetings at this location   Special educational needs teacher         Address  Phone  Notes  [facility] Residential Treatment [place],    [place]  [phone]   [facility]  [address], [place] [phone], [place], [place]   [place], [place] [phone] Admissions: 8am-3pm M-F  Incentives Substance [place] [address],    [place], [place] [phone]   [facility] [address] [place], [place], [place]   [facility] [address]  [place], [place]   [facility] - Intensive Outpatient [address], [place], [place]   [facility] ([place].,  [place], [place] [phone] or [phone]   [facility] Services (RTS) [address] [place], [place] Accepts Medicaid  [facility] [place] [address],  [place] [phone] Substance Abuse/Addiction Treatment   [facility] Organization         Address  Phone  Notes  [facility]  [phone]   Domenic Schwab, PhD [address] Arlis Porta [place], [place]   [phone] or [phone]   [place], [place] [phone]     [facility] [address], [place], [place] [phone] Insurance/Medicaid/sponsorship through [facility] and Families [address], [address]                                    [place], [place] [phone] [place] [address], [place] [phone]    Dr. Adele Schilder  [phone]   [facility] Dept. 1) [address], [place] 2) [place] 3)  [place] [address], [place] [phone]  [phone]   [place] [phone] or [phone] (After Hours)

## 2014-09-23 NOTE — ED Notes (Addendum)
Pt reports "indigestion sensation in chest" for last several days. Pt reports increased dyspnea with walking distances. Pt reports is out of diabetic meds as well.

## 2014-10-22 ENCOUNTER — Encounter (HOSPITAL_COMMUNITY)
Admission: EM | Disposition: A | Discharge status: Home / Self Care | Payer: Self-pay | Source: Home / Self Care | Attending: Cardiology

## 2014-10-22 ENCOUNTER — Inpatient Hospital Stay (HOSPITAL_COMMUNITY)
Admission: EM | Admit: 2014-10-22 | Discharge: 2014-10-23 | DRG: 247 | Disposition: A | Discharge status: Home / Self Care | Payer: Medicare Other | Attending: Cardiology | Admitting: Cardiology

## 2014-10-22 ENCOUNTER — Encounter (HOSPITAL_COMMUNITY): Payer: Self-pay

## 2014-10-22 ENCOUNTER — Emergency Department (HOSPITAL_COMMUNITY): Payer: Medicare Other

## 2014-10-22 DIAGNOSIS — E119 Type 2 diabetes mellitus without complications: Secondary | ICD-10-CM

## 2014-10-22 DIAGNOSIS — Z955 Presence of coronary angioplasty implant and graft: Secondary | ICD-10-CM

## 2014-10-22 DIAGNOSIS — K219 Gastro-esophageal reflux disease without esophagitis: Secondary | ICD-10-CM | POA: Diagnosis present

## 2014-10-22 DIAGNOSIS — I10 Essential (primary) hypertension: Secondary | ICD-10-CM | POA: Diagnosis present

## 2014-10-22 DIAGNOSIS — Z96641 Presence of right artificial hip joint: Secondary | ICD-10-CM | POA: Diagnosis present

## 2014-10-22 DIAGNOSIS — E785 Hyperlipidemia, unspecified: Secondary | ICD-10-CM | POA: Diagnosis present

## 2014-10-22 DIAGNOSIS — G8929 Other chronic pain: Secondary | ICD-10-CM | POA: Diagnosis present

## 2014-10-22 DIAGNOSIS — I2510 Atherosclerotic heart disease of native coronary artery without angina pectoris: Secondary | ICD-10-CM | POA: Diagnosis not present

## 2014-10-22 DIAGNOSIS — I25119 Atherosclerotic heart disease of native coronary artery with unspecified angina pectoris: Secondary | ICD-10-CM | POA: Diagnosis present

## 2014-10-22 DIAGNOSIS — M25559 Pain in unspecified hip: Secondary | ICD-10-CM | POA: Diagnosis present

## 2014-10-22 DIAGNOSIS — I214 Non-ST elevation (NSTEMI) myocardial infarction: Principal | ICD-10-CM | POA: Diagnosis present

## 2014-10-22 DIAGNOSIS — Z87891 Personal history of nicotine dependence: Secondary | ICD-10-CM | POA: Diagnosis not present

## 2014-10-22 DIAGNOSIS — Z6841 Body Mass Index (BMI) 40.0 and over, adult: Secondary | ICD-10-CM

## 2014-10-22 DIAGNOSIS — R0602 Shortness of breath: Secondary | ICD-10-CM | POA: Diagnosis not present

## 2014-10-22 DIAGNOSIS — M549 Dorsalgia, unspecified: Secondary | ICD-10-CM | POA: Diagnosis present

## 2014-10-22 DIAGNOSIS — E876 Hypokalemia: Secondary | ICD-10-CM

## 2014-10-22 DIAGNOSIS — Z720 Tobacco use: Secondary | ICD-10-CM | POA: Diagnosis not present

## 2014-10-22 DIAGNOSIS — E669 Obesity, unspecified: Secondary | ICD-10-CM | POA: Diagnosis present

## 2014-10-22 DIAGNOSIS — R079 Chest pain, unspecified: Secondary | ICD-10-CM | POA: Diagnosis not present

## 2014-10-22 HISTORY — PX: LEFT HEART CATHETERIZATION WITH CORONARY ANGIOGRAM: SHX5451

## 2014-10-22 HISTORY — DX: Tobacco use: Z72.0

## 2014-10-22 HISTORY — DX: Type 2 diabetes mellitus without complications: E11.9

## 2014-10-22 HISTORY — DX: Gastro-esophageal reflux disease without esophagitis: K21.9

## 2014-10-22 HISTORY — DX: Hyperlipidemia, unspecified: E78.5

## 2014-10-22 HISTORY — PX: PERCUTANEOUS CORONARY STENT INTERVENTION (PCI-S): SHX5485

## 2014-10-22 HISTORY — DX: Atherosclerotic heart disease of native coronary artery without angina pectoris: I25.10

## 2014-10-22 LAB — COMPREHENSIVE METABOLIC PANEL
ALT: 80 U/L — AB (ref 0–53)
AST: 52 U/L — AB (ref 0–37)
Albumin: 3.9 g/dL (ref 3.5–5.2)
Alkaline Phosphatase: 69 U/L (ref 39–117)
Anion gap: 8 (ref 5–15)
BUN: 12 mg/dL (ref 6–23)
CHLORIDE: 102 mmol/L (ref 96–112)
CO2: 24 mmol/L (ref 19–32)
CREATININE: 1.17 mg/dL (ref 0.50–1.35)
Calcium: 8.6 mg/dL (ref 8.4–10.5)
GFR, EST AFRICAN AMERICAN: 79 mL/min — AB (ref >90)
GFR, EST NON AFRICAN AMERICAN: 68 mL/min — AB (ref >90)
Glucose, Bld: 200 mg/dL — ABNORMAL HIGH (ref 70–99)
Potassium: 3.4 mmol/L — ABNORMAL LOW (ref 3.5–5.1)
Sodium: 134 mmol/L — ABNORMAL LOW (ref 135–145)
Total Bilirubin: 0.2 mg/dL — ABNORMAL LOW (ref 0.3–1.2)
Total Protein: 7.1 g/dL (ref 6.0–8.3)

## 2014-10-22 LAB — PROTIME-INR
INR: 1.12 (ref 0.00–1.49)
Prothrombin Time: 14.5 seconds (ref 11.6–15.2)

## 2014-10-22 LAB — CBC WITH DIFFERENTIAL/PLATELET
BASOS ABS: 0.1 10*3/uL (ref 0.0–0.1)
BASOS PCT: 1 % (ref 0–1)
Eosinophils Absolute: 0.2 10*3/uL (ref 0.0–0.7)
Eosinophils Relative: 2 % (ref 0–5)
HCT: 41.9 % (ref 39.0–52.0)
Hemoglobin: 13.5 g/dL (ref 13.0–17.0)
Lymphocytes Relative: 53 % — ABNORMAL HIGH (ref 12–46)
Lymphs Abs: 3.4 10*3/uL (ref 0.7–4.0)
MCH: 29.8 pg (ref 26.0–34.0)
MCHC: 32.2 g/dL (ref 30.0–36.0)
MCV: 92.5 fL (ref 78.0–100.0)
Monocytes Absolute: 0.6 10*3/uL (ref 0.1–1.0)
Monocytes Relative: 10 % (ref 3–12)
Neutro Abs: 2.2 10*3/uL (ref 1.7–7.7)
Neutrophils Relative %: 34 % — ABNORMAL LOW (ref 43–77)
PLATELETS: 221 10*3/uL (ref 150–400)
RBC: 4.53 MIL/uL (ref 4.22–5.81)
RDW: 14.5 % (ref 11.5–15.5)
WBC: 6.4 10*3/uL (ref 4.0–10.5)

## 2014-10-22 LAB — TROPONIN I
TROPONIN I: 0.57 ng/mL — AB (ref <0.031)
Troponin I: 0.79 ng/mL (ref <0.031)

## 2014-10-22 LAB — APTT: APTT: 101 s — AB (ref 24–37)

## 2014-10-22 LAB — MRSA PCR SCREENING: MRSA BY PCR: NEGATIVE

## 2014-10-22 LAB — POCT ACTIVATED CLOTTING TIME
ACTIVATED CLOTTING TIME: 300 s
Activated Clotting Time: 399 seconds

## 2014-10-22 LAB — GLUCOSE, CAPILLARY: GLUCOSE-CAPILLARY: 169 mg/dL — AB (ref 70–99)

## 2014-10-22 SURGERY — LEFT HEART CATHETERIZATION WITH CORONARY ANGIOGRAM
Anesthesia: LOCAL

## 2014-10-22 MED ORDER — NITROGLYCERIN 0.4 MG SL SUBL: 0.4000 mg | SUBLINGUAL_TABLET | SUBLINGUAL | Status: DC | PRN

## 2014-10-22 MED ORDER — ONDANSETRON HCL 4 MG/2ML IJ SOLN: 4.0000 mg | Freq: Four times a day (QID) | INTRAMUSCULAR | Status: DC | PRN

## 2014-10-22 MED ORDER — MIDAZOLAM HCL 2 MG/2ML IJ SOLN
INTRAMUSCULAR | Status: AC
  Filled 2014-10-22: qty 2

## 2014-10-22 MED ORDER — HYDRALAZINE HCL 20 MG/ML IJ SOLN: 20.0000 mg | INTRAMUSCULAR | Status: DC | PRN

## 2014-10-22 MED ORDER — HEPARIN BOLUS VIA INFUSION
4000.0000 [IU] | Freq: Once | INTRAVENOUS | Status: AC
  Administered 2014-10-22: 4000 [IU] via INTRAVENOUS

## 2014-10-22 MED ORDER — POTASSIUM CHLORIDE CRYS ER 20 MEQ PO TBCR
20.0000 meq | EXTENDED_RELEASE_TABLET | Freq: Once | ORAL | Status: AC
  Administered 2014-10-22: 20 meq via ORAL

## 2014-10-22 MED ORDER — LIDOCAINE HCL (PF) 1 % IJ SOLN
INTRAMUSCULAR | Status: AC
  Filled 2014-10-22: qty 30

## 2014-10-22 MED ORDER — SODIUM CHLORIDE 0.9 % IJ SOLN: 3.0000 mL | Freq: Two times a day (BID) | INTRAMUSCULAR | Status: DC

## 2014-10-22 MED ORDER — SODIUM CHLORIDE 0.9 % IV SOLN: 250.0000 mL | INTRAVENOUS | Status: DC | PRN

## 2014-10-22 MED ORDER — VERAPAMIL HCL 2.5 MG/ML IV SOLN
INTRAVENOUS | Status: AC
  Filled 2014-10-22: qty 2

## 2014-10-22 MED ORDER — TIROFIBAN HCL IV 5 MG/100ML: 0.1500 ug/kg/min | INTRAVENOUS | Status: AC

## 2014-10-22 MED ORDER — INFLUENZA VAC SPLIT QUAD 0.5 ML IM SUSY
0.5000 mL | PREFILLED_SYRINGE | INTRAMUSCULAR | Status: AC
  Administered 2014-10-23: 0.5 mL via INTRAMUSCULAR
  Filled 2014-10-22: qty 0.5

## 2014-10-22 MED ORDER — NITROGLYCERIN 1 MG/10 ML FOR IR/CATH LAB
INTRA_ARTERIAL | Status: AC
  Filled 2014-10-22: qty 10

## 2014-10-22 MED ORDER — ASPIRIN 81 MG PO CHEW: 81.0000 mg | CHEWABLE_TABLET | ORAL | Status: DC

## 2014-10-22 MED ORDER — ASPIRIN 81 MG PO CHEW
81.0000 mg | CHEWABLE_TABLET | Freq: Every day | ORAL | Status: DC
  Administered 2014-10-23: 81 mg via ORAL
  Filled 2014-10-22: qty 1

## 2014-10-22 MED ORDER — TICAGRELOR 90 MG PO TABS
90.0000 mg | ORAL_TABLET | Freq: Two times a day (BID) | ORAL | Status: DC
  Administered 2014-10-22 – 2014-10-23 (×2): 90 mg via ORAL
  Filled 2014-10-22 (×4): qty 1

## 2014-10-22 MED ORDER — ACETAMINOPHEN 325 MG PO TABS: 650.0000 mg | ORAL_TABLET | ORAL | Status: DC | PRN

## 2014-10-22 MED ORDER — FAMOTIDINE 20 MG PO TABS: 20.0000 mg | ORAL_TABLET | Freq: Two times a day (BID) | ORAL | Status: DC

## 2014-10-22 MED ORDER — OXYCODONE-ACETAMINOPHEN 5-325 MG PO TABS: 1.0000 | ORAL_TABLET | Freq: Four times a day (QID) | ORAL | Status: DC | PRN

## 2014-10-22 MED ORDER — ASPIRIN 81 MG PO CHEW
324.0000 mg | CHEWABLE_TABLET | Freq: Once | ORAL | Status: AC
  Administered 2014-10-22: 324 mg via ORAL
  Filled 2014-10-22: qty 4

## 2014-10-22 MED ORDER — HEPARIN (PORCINE) IN NACL 2-0.9 UNIT/ML-% IJ SOLN
INTRAMUSCULAR | Status: AC
  Filled 2014-10-22: qty 1000

## 2014-10-22 MED ORDER — METOPROLOL SUCCINATE ER 50 MG PO TB24: 50.0000 mg | ORAL_TABLET | Freq: Every day | ORAL | Status: DC

## 2014-10-22 MED ORDER — NITROGLYCERIN 2 % TD OINT
1.0000 [in_us] | TOPICAL_OINTMENT | Freq: Once | TRANSDERMAL | Status: AC
  Administered 2014-10-22: 1 [in_us] via TOPICAL
  Filled 2014-10-22: qty 1

## 2014-10-22 MED ORDER — SODIUM CHLORIDE 0.9 % IV SOLN
INTRAVENOUS | Status: DC
  Administered 2014-10-22: 100 mL/h via INTRAVENOUS

## 2014-10-22 MED ORDER — HEPARIN SODIUM (PORCINE) 1000 UNIT/ML IJ SOLN
INTRAMUSCULAR | Status: AC
  Filled 2014-10-22: qty 1

## 2014-10-22 MED ORDER — SODIUM CHLORIDE 0.9 % IJ SOLN: 3.0000 mL | INTRAMUSCULAR | Status: DC | PRN

## 2014-10-22 MED ORDER — HYDRALAZINE HCL 20 MG/ML IJ SOLN
INTRAMUSCULAR | Status: AC
  Administered 2014-10-22: 14:00:00
  Filled 2014-10-22: qty 1

## 2014-10-22 MED ORDER — SODIUM CHLORIDE 0.9 % IV SOLN: 1.0000 mL/kg/h | INTRAVENOUS | Status: AC

## 2014-10-22 MED ORDER — POTASSIUM CHLORIDE CRYS ER 20 MEQ PO TBCR
EXTENDED_RELEASE_TABLET | ORAL | Status: AC
  Filled 2014-10-22: qty 1

## 2014-10-22 MED ORDER — TIROFIBAN HCL IV 12.5 MG/250 ML
INTRAVENOUS | Status: AC
Start: 2014-10-22 — End: 2014-10-22
  Filled 2014-10-22: qty 250

## 2014-10-22 MED ORDER — HEPARIN (PORCINE) IN NACL 100-0.45 UNIT/ML-% IJ SOLN
1450.0000 [IU]/h | INTRAMUSCULAR | Status: DC
  Administered 2014-10-22: 1450 [IU]/h via INTRAVENOUS
  Filled 2014-10-22: qty 250

## 2014-10-22 MED ORDER — NITROGLYCERIN 2 % TD OINT: 1.0000 [in_us] | TOPICAL_OINTMENT | Freq: Once | TRANSDERMAL | Status: DC

## 2014-10-22 MED ORDER — ASPIRIN EC 81 MG PO TBEC: 81.0000 mg | DELAYED_RELEASE_TABLET | Freq: Every day | ORAL | Status: DC

## 2014-10-22 MED ORDER — FENTANYL CITRATE 0.05 MG/ML IJ SOLN
INTRAMUSCULAR | Status: AC
  Filled 2014-10-22: qty 2

## 2014-10-22 MED ORDER — ATORVASTATIN CALCIUM 20 MG PO TABS: 20.0000 mg | ORAL_TABLET | Freq: Every day | ORAL | Status: DC

## 2014-10-22 NOTE — Interval H&P Note (Signed)
Cath Lab Visit (complete for each Cath Lab visit)  Clinical Evaluation Leading to the Procedure:   ACS: Yes.    Non-ACS:    Anginal Classification: CCS IV  Anti-ischemic medical therapy: Maximal Therapy (2 or more classes of medications)  Non-Invasive Test Results: No non-invasive testing performed  Prior CABG: No previous CABG   TIMI Score  Patient Information:  TIMI Score is 4   UA/NSTEMI and intermediate-risk features (e.g., TIMI score 3-4) for short-term risk of death or nonfatal MI  Revascularization of the presumed culprit artery   A (8)  Indication: 10; Score: 8    History and Physical Interval Note:  10/22/2014 10:17 AM  [name]  has presented today for surgery, with the diagnosis of NSTEMI  The various methods of treatment have been discussed with the patient and family. After consideration of risks, benefits and other options for treatment, the patient has consented to  Procedure(s): LEFT HEART CATHETERIZATION WITH CORONARY ANGIOGRAM (N/A) as a surgical intervention .  The patient's history has been reviewed, patient examined, no change in status, stable for surgery.  I have reviewed the patient's chart and labs.  Questions were answered to the patient's satisfaction.     Phillip Jarmon S.

## 2014-10-22 NOTE — Progress Notes (Signed)
ANTICOAGULATION CONSULT NOTE - Initial Consult  Pharmacy Consult for heparin Indication: chest pain/ACS  No Known Allergies  Patient Measurements: Height: 5' 10.5" (179.1 cm) Weight: 278 lb (126.1 kg) IBW/kg (Calculated) : 74.15 Heparin Dosing Weight: 103  Vital Signs: Temp: 98.2 F (36.8 C) (01/26 0218) Temp Source: Oral (01/26 0218) BP: 118/63 mmHg (01/26 0400) Pulse Rate: 72 (01/26 0400)  Labs:  Recent Labs  10/22/14 0212  HGB 13.5  HCT 41.9  PLT 221  CREATININE 1.17  TROPONINI 0.57*   Estimated Creatinine Clearance: 94.7 mL/min (by C-G formula based on Cr of 1.17).  Medical History: Past Medical History  Diagnosis Date  . Diabetes mellitus   . Hypertension   . Chronic hip pain   . Chronic back pain    Medications:   (Not in a hospital admission)  Assessment: 59yo male c/o chest pain.  Asked to initiate heparin for ACS.    Goal of Therapy:  Heparin level 0.3-0.7 units/ml w/in 24 hrs of initiating Heparin Monitor platelets by anticoagulation protocol: Yes   Plan:  Heparin 4000 units IV now  Heparin infusion at 1450 units/hr Heparin level in 6-8 hrs then daily CBC daily while on Heparin  Margo AyeHall, Zoie Sarin A 10/22/2014,4:37 AM

## 2014-10-22 NOTE — ED Notes (Signed)
Pt reports burning in chest/chest discomfort that started approx 1 am.  Pt states he was seen here a few weeks ago for same symptoms, told it was "acid"   Pt states the pepcid he was prescribed is not helping.  Pt admits to eating a hamburger and hotdog for supper

## 2014-10-22 NOTE — ED Notes (Signed)
CRITICAL VALUE ALERT  Critical value received:  Troponin 0.57  Date of notification:  10/22/2014  Time of notification:  0332  Critical value read back:Yes.    Nurse who received alert:  Bronson Curbkristy Cylinda Santoli, rn  MD notified (1st page):  Dr Lynelle Doctorknapp  Time of first page:  0332  MD notified (2nd page):  Time of second page:  Responding MD:  Dr Lynelle Doctorknapp  Time MD responded:  [phone]

## 2014-10-22 NOTE — ED Notes (Signed)
CRITICAL VALUE ALERT  Critical value received:  Troponin 0.79  Date of notification:  10/22/2012  Time of notification:  0552  Critical value read back:Yes.    Nurse who received alert:  Bronson Curbkristy Takeo Harts, rn  MD notified (1st page):  Dr Lynelle Doctorknapp  Time of first page:  [phone]  MD notified (2nd page):  Time of second page:  Responding MD:  Dr Lynelle Doctorknapp  Time MD responded:  [phone]

## 2014-10-22 NOTE — H&P (Signed)
Physician History and Physical    Patient ID: [name] MRN: [id number] DOB/AGE: May 30, 1956 59 y.o. Admit date: 10/22/2014  Primary Care Physician: Kirk Ruths, MD Primary Cardiologist N/A  HPI: 59 yo BM [facility] ED for evaluation of NSTEMI. He reports he was watching TV at 3 am when he developed substernal chest pain described as burning. He took Pepcid without improvement. Pain radiated to right arm. No nausea, diaphoresis, or dyspnea. Went to ED. Ecg without acute change. troponins are positive. He had similar episodes of chest pain on 09/23/14 and again 9 days ago. He went to the ED on the first occasion and was treated for GERD. troponins at that time were normal. His episode 9 days ago was actually more severe but resolved after 30 minutes and he didn't seek attention.  He has a history of prior cardiac cath in March 2000 that showed 30% ostial left main disease but otherwise normal. No cardiac follow up since then. He has a history of DM type 2, HL, and HTN. History of tobacco abuse but reports he quit 6 months ago.   Review of systems complete and found to be negative unless listed above  Past Medical History  Diagnosis Date  . Diabetes mellitus   . Hypertension   . Chronic hip pain   . Chronic back pain   . Hyperlipidemia   . GERD (gastroesophageal reflux disease)     No family history on file.  History   Social History  . Marital Status: Legally Separated    Spouse Name: N/A    Number of Children: 6  . Years of Education: N/A   Occupational History  . disabled    Social History Main Topics  . Smoking status: Former Smoker    Types: Cigarettes    Start date: 04/21/2014  . Smokeless tobacco: Not on file  . Alcohol Use: 0.0 oz/week    0 Not specified per week     Comment: drinks beer on weekends  . Drug Use: No  . Sexual Activity: Not on file   Other Topics Concern  . Not on file   Social History Narrative    Past Surgical  History  Procedure Laterality Date  . Total hip arthroplasty Right      Prescriptions prior to admission  Medication Sig Dispense Refill Last Dose  . atorvastatin (LIPITOR) 20 MG tablet Take 20 mg by mouth daily.   09/22/2014 at Unknown time  . benzonatate (TESSALON) 100 MG capsule Take 1 capsule (100 mg total) by mouth 3 (three) times daily as needed for cough. 15 capsule 0   . famotidine (PEPCID) 20 MG tablet Take 1 tablet (20 mg total) by mouth 2 (two) times daily. 30 tablet 0   . lisinopril-hydrochlorothiazide (PRINZIDE) 10-12.5 MG per tablet Take 1 tablet by mouth daily. 30 tablet 2   . metFORMIN (GLUCOPHAGE) 500 MG tablet Take 1 tablet (500 mg total) by mouth 2 (two) times daily with a meal. 60 tablet 2   . oxyCODONE-acetaminophen (PERCOCET/ROXICET) 5-325 MG per tablet 1 or 2 tabs PO q6h prn pain 20 tablet 0     Physical Exam: Blood pressure 168/106, pulse 76, temperature 98.2 F (36.8 C), temperature source Oral, resp. rate 20, height 5' 10.5" (1.791 m), weight 278 lb (126.1 kg), SpO2 100 %.  Current Weight  10/22/14 278 lb (126.1 kg)  09/23/14 270 lb (122.471 kg)  10/13/13 270 lb (122.471 kg)    GENERAL:  Well appearing, obese  BM in NAD HEENT:  PERRL, EOMI, sclera are clear. Oropharynx is clear. NECK:  No jugular venous distention, carotid upstroke brisk and symmetric, no bruits, no thyromegaly or adenopathy LUNGS:  Clear to auscultation bilaterally CHEST:  Unremarkable HEART:  RRR,  PMI not displaced or sustained,S1 and S2 within normal limits, no S3, no S4: no clicks, no rubs, no murmurs ABD:  Obese, Soft, nontender. BS +, no masses or bruits. No hepatomegaly, no splenomegaly EXT:  2 + pulses throughout, no edema, no cyanosis no clubbing SKIN:  Warm and dry.  No rashes NEURO:  Alert and oriented x 3. Cranial nerves II through XII intact. PSYCH:  Cognitively intact    Labs:   Lab Results  Component Value Date   WBC 6.4 10/22/2014   HGB 13.5 10/22/2014   HCT 41.9  10/22/2014   MCV 92.5 10/22/2014   PLT 221 10/22/2014     Recent Labs Lab 10/22/14 0212  NA 134*  K 3.4*  CL 102  CO2 24  BUN 12  CREATININE 1.17  CALCIUM 8.6  PROT 7.1  BILITOT 0.2*  ALKPHOS 69  ALT 80*  AST 52*  GLUCOSE 200*   Lab Results  Component Value Date   TROPONINI 0.79* 10/22/2014   TROPONINI 0.57* 10/22/2014   TROPONINI 0.03 09/23/2014   No results found for: CHOL No results found for: HDL No results found for: LDLCALC No results found for: TRIG No results found for: CHOLHDL No results found for: LDLDIRECT  No results found for: PROBNP No results found for: TSH No results found for: HGBA1C  Radiology: CHEST - 1 VIEW  COMPARISON: 09/23/2014  FINDINGS: Shallow inspiration with elevation of the right hemidiaphragm. The heart size and mediastinal contours are within normal limits. Both lungs are clear. The visualized skeletal structures are unremarkable.  IMPRESSION: No active disease.   Electronically Signed  By: Burman NievesWilliam Stevens M.D.  On: 10/22/2014 02:31  EKG: NSR with nonspecific TWA. I have personally reviewed and interpreted this study.   ASSESSMENT AND PLAN:  1. NSTEMI. Recurrent chest pain. Currently pain free on IV heparin and topical nitrates. Will cycle cardiac enzymes. Start beta blocker. Continue statin. Plan cardiac cath today. The procedure and risks were reviewed including but not limited to death, myocardial infarction, stroke, arrythmias, bleeding, transfusion, emergency surgery, dye allergy, or renal dysfunction. The patient voices understanding and is agreeable to proceed. 2. DM type 2. Will cover with SSI. Metformin on hold for cath. 3. HTN. Hold lisinopril HCT for cardiac cath given mild renal insufficiency. IV hydration. Start beta blocker. 4. Hyperlipidemia. On lipitor. Will need to repeat LFTs with mildly elevated transaminases.  5. Tobacco abuse. Support continued cessation 6. Obesity.  7. S/p right THR.    Signed: [name], MDFACC  10/22/2014, 9:20 AM

## 2014-10-22 NOTE — CV Procedure (Signed)
PROCEDURE:  Left heart catheterization with selective coronary angiography, left ventriculogram.  PCI circumflex (distal and proximal);  PCI RCA  INDICATIONS:  NSTEMI  The risks, benefits, and details of the procedure were explained to the patient.  The patient verbalized understanding and wanted to proceed.  Informed written consent was obtained.  PROCEDURE TECHNIQUE:  After Xylocaine anesthesia a 49F slender sheath was placed in the right radial artery with a single anterior needle wall stick.   IV Heparin was given.  Right coronary angiography was done using a Judkins R4 guide catheter.  Left coronary angiography was done using a Judkins L3.5 guide catheter.  Left ventriculography was done using a pigtail catheter.  A TR band was used for hemostasis.   CONTRAST:  Total of 185 cc.  COMPLICATIONS:  None.    HEMODYNAMICS:  Aortic pressure was 136/89; LV pressure was 136/11; LVEDP 20.  There was no gradient between the left ventricle and aorta.    ANGIOGRAPHIC DATA:   The left main coronary artery is widely patent.  The left anterior descending artery is a large vessel which wraps around the apex. There is minimal atherosclerosis throughout the vessel. There is a large diagonal vessel which is widely patent.  The left circumflex artery is a large vessel. Proximally, there is an 80% stenosis. In the distal vessel, there is a 95% stenosis which is most likely the culprit for his presentation today.  There are 2 medium-size. obtuse marginal vessels which appear widely patent.  The first obtuse marginal, there is a 75% stenosis. Although this vessel is long, it appears to be less than 2 mm in diameter.  The right coronary artery is a large, dominant vessel. In the proximal vessel, there is a 90% lesion.  The posterior lateral artery is large and widely patent. The posterior descending artery is small but patent.  LEFT VENTRICULOGRAM:  Left ventricular angiogram was done in the 30 RAO  projection and revealed overall normal systolic function with an estimated ejection fraction of  50%.  There is basal inferior hypokinesis. LVEDP was 20 mmHg.  PCI NARRATIVE: ACLS 3.0 guiding catheter was used to engage left main. IV heparin and IV tirofiban were used for anticoagulation. A wire was placed down the circumflex. Distal vessel was predilated with a 2.0 x 12 balloon. The proximal lesion was predilated with the same balloon. The distal lesion was stented with a 2.25 x 12 Synergy drug-eluting stent to high pressure. The proximal lesion was then stented with a 3.0 x 16 Synergy drug-eluting stent to high pressure. The proximal stent was postdilated with a 3.25 x 12 noncompliant balloon to high pressure. There was an excellent angiographic result. The patient did have typical angina symptoms with balloon inflations.  A JR4 guiding catheter was used to engage the RCA. A pro-water wire was placed across the lesion in the proximal RCA. The same balloon was used to predilate. A 2.75 x 20 Synergy drug-eluting stent was then deployed at high pressure to over 3 mm in diameter. At the distal edge of the stent, there is some vasospasm. Intracoronary nitroglycerin was given. The spasm resolved. There was an excellent angiographic result with no residual stenosis. The patient tolerated the procedure well.  IMPRESSIONS:  1. Normal left main coronary artery. 2. Mild , nonobstructive atherosclerosis left anterior descending artery and its branches. 3. 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  The distal circumflex was stented with a  2.25 x 12 drug-eluting stent. The proximal circumflex was stented with a 3.0 x 16 Synergy drug-eluting stent. 4. Severe disease in the proximal right coronary artery which was treated with a 2.75 x 20 Synergy drug-eluting stent. 5. Overall normal left ventricular systolic function despite basal inferior hypokinesis.  LVEDP 20 mmHg.   Ejection fraction 50%.  RECOMMENDATION:  Continue IV tirofiban for 4 hours post procedure.   Continue dual antiplatelet therapy for at least a year without interruption. He'll need aggressive secondary prevention including weight loss, blood pressure control, diabetes control and lipid lowering therapy. Watch overnight. Potential discharge tomorrow if he does well.

## 2014-10-22 NOTE — Care Management Note (Addendum)
    Page 1 of 1   10/23/2014     2:36:19 PM CARE MANAGEMENT NOTE 10/23/2014  Patient:  [name],[name]   Account Number:  [id number]  Date Initiated:  10/22/2014  Documentation initiated by:  Junius CreamerWELL,DEBBIE  Subjective/Objective Assessment:   adm w mi     Action/Plan:   lives at home   Anticipated DC Date:  10/23/2014   Anticipated DC Plan:  HOME/SELF CARE      DC Planning Services  CM consult  Medication Assistance      Choice offered to / List presented to:             Status of service:   Medicare Important Message given?   (If response is "NO", the following Medicare IM given date fields will be blank) Date Medicare IM given:   Medicare IM given by:   Date Additional Medicare IM given:   Additional Medicare IM given by:    Discharge Disposition:  HOME/SELF CARE  Per UR Regulation:  Reviewed for med. necessity/level of care/duration of stay  If discussed at Long Length of Stay Meetings, dates discussed:    Comments:  1/27  1434 debbie Rob Mciver rn,bsn pt has applied for medicaid but was not aware he had been certf for medicaid. he will verify his medicaid number w [employer] officermedicaid worker. he was also given brilinta pt assist form in case needed. he has 30day free brilinta card.  1/26 1408 debbie Sedale Jenifer rn,bsn gave pt 30day free brilinta card. pt states no supplemental ins for meds. left brilinta pt assist form on shadow chart for md signature. has medicaid, will have 3.00 per month copay.

## 2014-10-22 NOTE — Progress Notes (Signed)
Dr Catalina GravelJ Varanasi in to see pt and famiily and explain procedure.  Pt to cath lab via stretcher.

## 2014-10-22 NOTE — Progress Notes (Addendum)
[facility] for [facility] cath procedure.  Pt denies any discomfort at this time.  Dr SwazilandJordan in to talk to pt about the procedure and plan of care.  Pt  Signed consent and family at bedside.

## 2014-10-22 NOTE — ED Provider Notes (Signed)
CSN: [id number]     Arrival date & time 10/22/14  0204 History   First MD Initiated Contact with Patient 10/22/14 0310     Chief Complaint  Patient presents with  . Chest Pain     (Consider location/radiation/quality/duration/timing/severity/associated sxs/prior Treatment) HPI  Patient reports he awakened at 1 AM this morning from sleep feeling hot and sweaty. He states he got up and turn down the heat. He reports after he got up he had acute onset of a burning sensation like his chest was on fire in the center of his chest. It does not radiate up into his throat. He does have tingling in both his arms. He states he was panting. He denies any nausea or vomiting. He states he was still having the pain when he first arrived to the ED however his pain is gone now. He states he ate hamburger and a hot dog for dinner tonight. He has been noticing an intolerance to greasy foods. He states he took 2 Aleve and took his blood pressure pill for this morning early because he thought it may be his blood pressure causing the discomfort. He also complains of a diffuse headache. He does admit he has not been taking his blood pressure medication every day. Patient states he was seen in the ED a couple weeks ago with similar discomfort and was diagnosed with gastric esophageal reflux disease. He states he was placed on Pepcid which he states was not helping however he has not had pain until tonight and also about 8 days ago while he was watching football injury drinking beer. Patient states he had a cardiac catheterization 6 or 7 years ago and he has a cardiac stent. He states he was having chest pain at that time but it's different from the chest pain he is having tonight. He does not have his card showing what type of stent or where it was placed.  [facility], appt with PA Loreta AveMann on Feb 4 Cardiology ? [facility]  Past Medical History  Diagnosis Date  . Diabetes mellitus   . Hypertension   . Chronic  hip pain   . Chronic back pain    Past Surgical History  Procedure Laterality Date  . Joint replacement    . Cardiac surgery     No family history on file. History  Substance Use Topics  . Smoking status: Former Smoker    Types: Cigarettes  . Smokeless tobacco: Not on file  . Alcohol Use: Yes     Comment: occ   patient quit smoking 6 months ago, he was smoking 1 pack per day. He drinks occasionally. Patient is on disability for diabetes, hip replacement, and heart problems.  Review of Systems  All other systems reviewed and are negative.     Allergies  Review of patient's allergies indicates no known allergies.  Home Medications   Prior to Admission medications   Medication Sig Start Date End Date Taking? Authorizing Provider  atorvastatin (LIPITOR) 20 MG tablet Take 20 mg by mouth daily.   Yes Historical Provider, MD  benzonatate (TESSALON) 100 MG capsule Take 1 capsule (100 mg total) by mouth 3 (three) times daily as needed for cough. 10/13/13  Yes Samuel JesterKathleen McManus, DO  famotidine (PEPCID) 20 MG tablet Take 1 tablet (20 mg total) by mouth 2 (two) times daily. 09/23/14  Yes Vanetta MuldersScott Zackowski, MD  lisinopril-hydrochlorothiazide (PRINZIDE) 10-12.5 MG per tablet Take 1 tablet by mouth daily. 09/23/14  Yes Vanetta MuldersScott Zackowski,  MD  metFORMIN (GLUCOPHAGE) 500 MG tablet Take 1 tablet (500 mg total) by mouth 2 (two) times daily with a meal. 09/23/14  Yes Vanetta Mulders, MD  oxyCODONE-acetaminophen (PERCOCET/ROXICET) 5-325 MG per tablet 1 or 2 tabs PO q6h prn pain 10/13/13  Yes Samuel Jester, DO   BP 123/84 mmHg  Pulse 69  Temp(Src) 98.2 F (36.8 C) (Oral)  Resp 21  Ht 5' 10.5" (1.791 m)  Wt 278 lb (126.1 kg)  BMI 39.31 kg/m2  SpO2 99% Physical Exam  Constitutional: He is oriented to person, place, and time. He appears well-developed and well-nourished.  Non-toxic appearance. He does not appear ill. No distress.  HENT:  Head: Normocephalic and atraumatic.  Right Ear: External  ear normal.  Left Ear: External ear normal.  Nose: Nose normal. No mucosal edema or rhinorrhea.  Mouth/Throat: Oropharynx is clear and moist and mucous membranes are normal. No dental abscesses or uvula swelling.  Eyes: Conjunctivae and EOM are normal. Pupils are equal, round, and reactive to light.  Neck: Normal range of motion and full passive range of motion without pain. Neck supple.  Cardiovascular: Normal rate, regular rhythm and normal heart sounds.  Exam reveals no gallop and no friction rub.   No murmur heard. Pulmonary/Chest: Effort normal and breath sounds normal. No respiratory distress. He has no wheezes. He has no rhonchi. He has no rales. He exhibits no tenderness and no crepitus.  Abdominal: Soft. Normal appearance and bowel sounds are normal. He exhibits no distension. There is no tenderness. There is no rebound and no guarding.  Musculoskeletal: Normal range of motion. He exhibits no edema or tenderness.  Moves all extremities well.   Neurological: He is alert and oriented to person, place, and time. He has normal strength. No cranial nerve deficit.  Skin: Skin is warm, dry and intact. No rash noted. No erythema. No pallor.  Psychiatric: He has a normal mood and affect. His speech is normal and behavior is normal. His mood appears not anxious.  Nursing note and vitals reviewed.   ED Course  Procedures (including critical care time)  Medications  heparin ADULT infusion 100 units/mL (25000 units/250 mL) (1,450 Units/hr Intravenous New Bag/Given 10/22/14 0440)  aspirin chewable tablet 324 mg (324 mg Oral Given 10/22/14 0351)  nitroGLYCERIN (NITROGLYN) 2 % ointment 1 inch (1 inch Topical Given 10/22/14 0352)  heparin bolus via infusion 4,000 Units (0 Units Intravenous Stopped 10/22/14 0444)    Patient's first troponin was positive while was talking to the patient. Patient states he is currently pain-free. Started on aspirin, nitroglycerin paste, and started on heparin bolus and  drip.  05:45 Patient given his second troponin result. He remains pain-free. He is agreeable to transfer  06:02 Dr Swaziland, Cardiology, transfer to [facility] to step down, Dr Swaziland as attending  Labs Review Results for orders placed or performed during the hospital encounter of 10/22/14  CBC with Differential  Result Value Ref Range   WBC 6.4 4.0 - 10.5 K/uL   RBC 4.53 4.22 - 5.81 MIL/uL   Hemoglobin 13.5 13.0 - 17.0 g/dL   HCT 16.1 09.6 - 04.5 %   MCV 92.5 78.0 - 100.0 fL   MCH 29.8 26.0 - 34.0 pg   MCHC 32.2 30.0 - 36.0 g/dL   RDW 40.9 81.1 - 91.4 %   Platelets 221 150 - 400 K/uL   Neutrophils Relative % 34 (L) 43 - 77 %   Neutro Abs 2.2 1.7 - 7.7 K/uL   Lymphocytes  Relative 53 (H) 12 - 46 %   Lymphs Abs 3.4 0.7 - 4.0 K/uL   Monocytes Relative 10 3 - 12 %   Monocytes Absolute 0.6 0.1 - 1.0 K/uL   Eosinophils Relative 2 0 - 5 %   Eosinophils Absolute 0.2 0.0 - 0.7 K/uL   Basophils Relative 1 0 - 1 %   Basophils Absolute 0.1 0.0 - 0.1 K/uL  Comprehensive metabolic panel  Result Value Ref Range   Sodium 134 (L) 135 - 145 mmol/L   Potassium 3.4 (L) 3.5 - 5.1 mmol/L   Chloride 102 96 - 112 mmol/L   CO2 24 19 - 32 mmol/L   Glucose, Bld 200 (H) 70 - 99 mg/dL   BUN 12 6 - 23 mg/dL   Creatinine, Ser 1.61 0.50 - 1.35 mg/dL   Calcium 8.6 8.4 - 09.6 mg/dL   Total Protein 7.1 6.0 - 8.3 g/dL   Albumin 3.9 3.5 - 5.2 g/dL   AST 52 (H) 0 - 37 U/L   ALT 80 (H) 0 - 53 U/L   Alkaline Phosphatase 69 39 - 117 U/L   Total Bilirubin 0.2 (L) 0.3 - 1.2 mg/dL   GFR calc non Af Amer 68 (L) >90 mL/min   GFR calc Af Amer 79 (L) >90 mL/min   Anion gap 8 5 - 15  Troponin I  Result Value Ref Range   Troponin I 0.57 (HH) <0.031 ng/mL  Troponin I  Result Value Ref Range   Troponin I 0.79 (HH) <0.031 ng/mL  APTT  Result Value Ref Range   aPTT 101 (H) 24 - 37 seconds  Protime-INR  Result Value Ref Range   Prothrombin Time 14.5 11.6 - 15.2 seconds   INR 1.12 0.00 - 1.49   Laboratory interpretation  all normal except for positive troponin that is increasing 2 hours later.     Imaging Review Dg Chest 1 View  10/22/2014   CLINICAL DATA:  Sharp medial chest pain since 0100 hr tonight. Shortness of breath.  EXAM: CHEST - 1 VIEW  COMPARISON:  09/23/2014  FINDINGS: Shallow inspiration with elevation of the right hemidiaphragm. The heart size and mediastinal contours are within normal limits. Both lungs are clear. The visualized skeletal structures are unremarkable.  IMPRESSION: No active disease.   Electronically Signed   By: Burman Nieves M.D.   On: 10/22/2014 02:31     EKG Interpretation   Date/Time:  Tuesday October 22 2014 02:14:53 EST Ventricular Rate:  79 PR Interval:  138 QRS Duration: 81 QT Interval:  382 QTC Calculation: 438 R Axis:   24 Text Interpretation:  Sinus rhythm Abnormal R-wave progression, early  transition Nonspecific repol abnormality, lateral leads No significant  change since last tracing 23 Sep 2014 Confirmed by Lezlee Gills  MD-I, Kaila Devries  ([id number]) on 10/22/2014 2:19:10 AM      MDM   Final diagnoses:  NSTEMI (non-ST elevated myocardial infarction)    Plan transfer to [facility]  CRITICAL CARE Performed by: Devoria Albe L Total critical care time: 32 min Critical care time was exclusive of separately billable procedures and treating other patients. Critical care was necessary to treat or prevent imminent or life-threatening deterioration. Critical care was time spent personally by me on the following activities: development of treatment plan with patient and/or surrogate as well as nursing, discussions with consultants, evaluation of patient's response to treatment, examination of patient, obtaining history from patient or surrogate, ordering and performing treatments and interventions, ordering and review of  laboratory studies, ordering and review of radiographic studies, pulse oximetry and re-evaluation of patient's condition.     Ward Givens,  MD 10/22/14 [phone]

## 2014-10-23 ENCOUNTER — Encounter (HOSPITAL_COMMUNITY): Payer: Self-pay | Admitting: Cardiology

## 2014-10-23 DIAGNOSIS — E785 Hyperlipidemia, unspecified: Secondary | ICD-10-CM

## 2014-10-23 DIAGNOSIS — I214 Non-ST elevation (NSTEMI) myocardial infarction: Secondary | ICD-10-CM | POA: Diagnosis not present

## 2014-10-23 DIAGNOSIS — E119 Type 2 diabetes mellitus without complications: Secondary | ICD-10-CM

## 2014-10-23 DIAGNOSIS — I2510 Atherosclerotic heart disease of native coronary artery without angina pectoris: Secondary | ICD-10-CM

## 2014-10-23 DIAGNOSIS — Z720 Tobacco use: Secondary | ICD-10-CM | POA: Diagnosis not present

## 2014-10-23 DIAGNOSIS — K219 Gastro-esophageal reflux disease without esophagitis: Secondary | ICD-10-CM | POA: Diagnosis present

## 2014-10-23 HISTORY — DX: Tobacco use: Z72.0

## 2014-10-23 HISTORY — DX: Atherosclerotic heart disease of native coronary artery without angina pectoris: I25.10

## 2014-10-23 HISTORY — DX: Type 2 diabetes mellitus without complications: E11.9

## 2014-10-23 HISTORY — DX: Hyperlipidemia, unspecified: E78.5

## 2014-10-23 LAB — BASIC METABOLIC PANEL
Anion gap: 7 (ref 5–15)
BUN: 7 mg/dL (ref 6–23)
CO2: 25 mmol/L (ref 19–32)
Calcium: 8.8 mg/dL (ref 8.4–10.5)
Chloride: 104 mmol/L (ref 96–112)
Creatinine, Ser: 1.12 mg/dL (ref 0.50–1.35)
GFR calc Af Amer: 83 mL/min — ABNORMAL LOW (ref >90)
GFR calc non Af Amer: 72 mL/min — ABNORMAL LOW (ref >90)
Glucose, Bld: 165 mg/dL — ABNORMAL HIGH (ref 70–99)
Potassium: 3.8 mmol/L (ref 3.5–5.1)
Sodium: 136 mmol/L (ref 135–145)

## 2014-10-23 LAB — CBC
HEMATOCRIT: 41.5 % (ref 39.0–52.0)
HEMOGLOBIN: 14 g/dL (ref 13.0–17.0)
MCH: 30.2 pg (ref 26.0–34.0)
MCHC: 33.7 g/dL (ref 30.0–36.0)
MCV: 89.4 fL (ref 78.0–100.0)
PLATELETS: 192 10*3/uL (ref 150–400)
RBC: 4.64 MIL/uL (ref 4.22–5.81)
RDW: 14.5 % (ref 11.5–15.5)
WBC: 6.2 10*3/uL (ref 4.0–10.5)

## 2014-10-23 MED ORDER — TICAGRELOR 90 MG PO TABS: 90.0000 mg | ORAL_TABLET | Freq: Two times a day (BID) | ORAL | Status: DC

## 2014-10-23 MED ORDER — METOPROLOL TARTRATE 25 MG PO TABS
25.0000 mg | ORAL_TABLET | Freq: Two times a day (BID) | ORAL | Status: DC
  Administered 2014-10-23: 25 mg via ORAL
  Filled 2014-10-23: qty 1

## 2014-10-23 MED ORDER — ATORVASTATIN CALCIUM 80 MG PO TABS
80.0000 mg | ORAL_TABLET | Freq: Every day | ORAL | Status: DC
  Administered 2014-10-23: 80 mg via ORAL
  Filled 2014-10-23: qty 1

## 2014-10-23 MED ORDER — NITROGLYCERIN 0.4 MG SL SUBL: 0.4000 mg | SUBLINGUAL_TABLET | SUBLINGUAL | Status: DC | PRN

## 2014-10-23 MED ORDER — ACETAMINOPHEN 325 MG PO TABS: 650.0000 mg | ORAL_TABLET | ORAL | Status: DC | PRN

## 2014-10-23 MED ORDER — HYDROCHLOROTHIAZIDE 12.5 MG PO CAPS
12.5000 mg | ORAL_CAPSULE | Freq: Every day | ORAL | Status: DC
  Administered 2014-10-23: 12.5 mg via ORAL
  Filled 2014-10-23: qty 1

## 2014-10-23 MED ORDER — ATORVASTATIN CALCIUM 80 MG PO TABS: 80.0000 mg | ORAL_TABLET | Freq: Every day | ORAL | Status: DC

## 2014-10-23 MED ORDER — LISINOPRIL 10 MG PO TABS
10.0000 mg | ORAL_TABLET | Freq: Every day | ORAL | Status: DC
  Administered 2014-10-23: 10 mg via ORAL
  Filled 2014-10-23: qty 1

## 2014-10-23 MED ORDER — PANTOPRAZOLE SODIUM 40 MG PO TBEC: 40.0000 mg | DELAYED_RELEASE_TABLET | Freq: Every day | ORAL | Status: DC

## 2014-10-23 MED ORDER — METOPROLOL TARTRATE 25 MG PO TABS: 25.0000 mg | ORAL_TABLET | Freq: Two times a day (BID) | ORAL | Status: DC

## 2014-10-23 MED ORDER — METFORMIN HCL 500 MG PO TABS: 500.0000 mg | ORAL_TABLET | Freq: Two times a day (BID) | ORAL | Status: DC

## 2014-10-23 NOTE — Progress Notes (Signed)
Pt. Being discharged. Vital signs wdl. Went over discharge instructions. Pt verbalized understanding. IV's and lines removed.

## 2014-10-23 NOTE — Progress Notes (Addendum)
PROGRESS NOTE  Subjective:   Mr. Kinnard is a 59 yo who was admitted on Jan. 26 with CP.  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  He has done well.  Troponin levels are minimally elevated.    Objective:    Vital Signs:   Temp:  [97.6 F (36.4 C)-98.2 F (36.8 C)] 98.2 F (36.8 C) (01/27 0800) Pulse Rate:  [63-105] 98 (01/27 0800) Resp:  [13-30] 30 (01/27 0800) BP: (97-173)/(59-124) 153/83 mmHg (01/27 0800) SpO2:  [98 %-100 %] 100 % (01/27 0800) Weight:  [295 lb 6.7 oz (134 kg)] 295 lb 6.7 oz (134 kg) (01/27 0400)  Last BM Date: 10/22/14   24-hour weight change: Weight change: 17 lb 6.7 oz (7.9 kg)  Weight trends: Filed Weights   10/22/14 0218 10/23/14 0400  Weight: 278 lb (126.1 kg) 295 lb 6.7 oz (134 kg)    Intake/Output:  01/26 0701 - 01/27 0700 In: 1152 [P.O.:120; I.V.:1032] Out: 2025 [Urine:2025] Total I/O In: 240 [P.O.:240] Out: -    Physical Exam: BP 153/83 mmHg  Pulse 98  Temp(Src) 98.2 F (36.8 C) (Oral)  Resp 30  Ht 5' 10.5" (1.791 m)  Wt 295 lb 6.7 oz (134 kg)  BMI 41.77 kg/m2  SpO2 100%  Wt Readings from Last 3 Encounters:  10/23/14 295 lb 6.7 oz (134 kg)  09/23/14 270 lb (122.471 kg)  10/13/13 270 lb (122.471 kg)    General: Vital signs reviewed and noted.   Head: Normocephalic, atraumatic.  Eyes: conjunctivae/corneas clear.  EOM's intact.   Throat: normal  Neck:  normal   Lungs:    clear   Heart:   RR,   Abdomen:  Obese   Extremities: Right radial cath site is ok    Neurologic: A&O X3, CN II - XII are grossly intact.   Psych: Normal     Labs: BMET:  Recent Labs  10/22/14 0212 10/23/14 0248  NA 134* 136  K 3.4* 3.8  CL 102 104  CO2 24 25  GLUCOSE 200* 165*  BUN 12 7  CREATININE 1.17 1.12  CALCIUM 8.6 8.8    Liver function tests:  Recent Labs  10/22/14 0212  AST 52*  ALT 80*  ALKPHOS 69  BILITOT 0.2*  PROT 7.1  ALBUMIN 3.9   No results for input(s): LIPASE, AMYLASE in the  last 72 hours.  CBC:  Recent Labs  10/22/14 0212 10/23/14 0248  WBC 6.4 6.2  NEUTROABS 2.2  --   HGB 13.5 14.0  HCT 41.9 41.5  MCV 92.5 89.4  PLT 221 192    Cardiac Enzymes:  Recent Labs  10/22/14 0212 10/22/14 0441  TROPONINI 0.57* 0.79*    Coagulation Studies:  Recent Labs  10/22/14 0441  LABPROT 14.5  INR 1.12    Other: Invalid input(s): POCBNP No results for input(s): DDIMER in the last 72 hours. No results for input(s): HGBA1C in the last 72 hours. No results for input(s): CHOL, HDL, LDLCALC, TRIG, CHOLHDL in the last 72 hours. No results for input(s): TSH, T4TOTAL, T3FREE, THYROIDAB in the last 72 hours.  Invalid input(s): FREET3 No results for input(s): VITAMINB12, FOLATE, FERRITIN, TIBC, IRON, RETICCTPCT in the last 72 hours.   Other results:  EKG   Medications:    Infusions:    Scheduled Medications: . aspirin  81 mg Oral Daily  . ticagrelor  90 mg Oral BID    Assessment/ Plan:   Active Problems:  NSTEMI (non-ST elevated myocardial infarction) will add metoprolol 25  BID Restart Lisinopril / HCTZ continue brilinta and ASA  restart metformin in 2 days. He would like a nutritionist consult. Increase atorvastatin to 80 a day.   Will see Dr. SwazilandJordan or PA / NP in several weeks.   Disposition: DC to home today . Length of Stay: 1  Vesta MixerPhilip J. Shadrick Senne, Montez HagemanJr., MD, [facility] 10/23/2014, 10:58 AM Office [phone] Pager [phone]

## 2014-10-23 NOTE — Plan of Care (Signed)
Problem: Food- and Nutrition-Related Knowledge Deficit (NB-1.1) Goal: Nutrition education Formal process to instruct or train a patient/client in a skill or to impart knowledge to help patients/clients voluntarily manage or modify food choices and eating behavior to maintain or improve health. Outcome: Adequate for Discharge  RD consulted for nutrition education regarding diabetes.   No results found for: HGBA1C  RD provided "Plate Method Meal Planning Tips" handout. Discussed different food groups and their effects on blood sugar, emphasizing carbohydrate-containing foods. Provided list of carbohydrates and recommended serving sizes of common foods.  Discussed importance of controlled and consistent carbohydrate intake throughout the day. Provided examples of ways to balance meals/snacks and encouraged intake of high-fiber, whole grain complex carbohydrates. Teach back method used.  Expect fair to good compliance.  Body mass index is 41.77 kg/(m^2). Pt meets criteria for extreme obesity, class III based on current BMI.  Current diet order is Heart Healthy, patient is consuming approximately 100% of meals at this time. Labs and medications reviewed. No further nutrition interventions warranted at this time. RD contact information provided. If additional nutrition issues arise, please re-consult RD.  Cayson Kalb A. Mayford KnifeWilliams, RD, LDN, CDE Pager: [phone] After hours Pager: [phone]

## 2014-10-23 NOTE — Progress Notes (Signed)
CARDIAC REHAB PHASE I   PRE:  Rate/Rhythm: 90 SR  BP:  Supine:   Sitting: 152/83  Standing:    SaO2: 98 RA  MODE:  Ambulation: 390 ft   POST:  Rate/Rhythm: 129 ST  BP:  Supine:   Sitting: 174/108  Standing:    SaO2:  0840-1030 Pt tolerated ambulation well without c/o of cp or SOB. BP after walk 174/108 HR 129 ST. Pt to chair after walk. Completed MI and stent education with pt. He voices understanding. Pt agrees to Outpt. CRP in [place], will send referral. Pt quit smoking 6 months ago and states that he never plans to smoke again. We discussed relapse and triggers.I gave him tips for quitting, coaching contact number and quit smart class information. Pt is drinking sugar soft drinks and is not following a diabetic diet. He admits to not always taking his medications, I stressed the importance of Brilinta, ASA and all of his medications.We discussed a lot of questions he had about diet. Pt admits to a lot of snoring at night and waking up not feeling rested.   Melina CopaLisa Breyanna Valera RN 10/23/2014 10:36 AM

## 2014-10-23 NOTE — Discharge Instructions (Signed)
Continue the asprin and brilinta- they are to keep you stent open.  Stopping could cause a heart attack.  Call [facility] at [phone] if any bleeding, swelling or drainage at cath site.  May shower, no tub baths for 48 hours for groin sticks. No lifting over 5 pounds for 5 days.  No Driving for 5 days.    Take 1 NTG, under your tongue, while sitting.  If no relief of pain may repeat NTG, one tab every 5 minutes up to 3 tablets total over 15 minutes.  If no relief CALL 911.  If you have dizziness/lightheadness  while taking NTG, stop taking and call 911.        Call if any problems or questions.  Heart Healthy diabetic diet.  No smoking you are doing a good job stopping Exercise per cardiac rehab  You need a primary care doctor to manage your diabetes and other medical problems.     No work for 2 weeks.  No Metformin until the 29th of Jan. It may interact with cath dye.  Acute Coronary Syndrome Acute coronary syndrome (ACS) is an urgent problem in which the blood and oxygen supply to the heart is critically deficient. ACS requires hospitalization because one or more coronary arteries may be blocked. ACS represents a range of conditions including:  Previous angina that is now unstable, lasts longer, happens at rest, or is more intense.  A heart attack, with heart muscle cell injury and death. There are three vital coronary arteries that supply the heart muscle with blood and oxygen so that it can pump blood effectively. If blockages to these arteries develop, blood flow to the heart muscle is reduced. If the heart does not get enough blood, angina may occur as the first warning sign. SYMPTOMS   The most common signs of angina include:  Tightness or squeezing in the chest.  Feeling of heaviness on the chest.  Discomfort in the arms, neck, back, or jaw.  Shortness of breath and nausea.  Cold, wet skin.  Angina is usually brought on by physical effort or  excitement which increase the oxygen needs of the heart. These states increase the blood flow needs of the heart beyond what can be delivered.  Other symptoms that are not as common include:  Fatigue  Unexplained feelings of nervousness or anxiety  Weakness  Diarrhea  Sometimes, you may not have noticed any symptoms at all but still suffered a cardiac injury. TREATMENT   Medicines to help discomfort may include nitroglycerin (nitro) in the form of tablets or a spray for rapid relief, or longer-acting forms such as cream, patches, or capsules. (Be aware that there are many side effects and possible interactions with other drugs).  Other medicines may be used to help the heart pump better.  Procedures to open blocked arteries including angioplasty or stent placement to keep the arteries open.  Open heart surgery may be needed when there are many blockages or they are in critical locations that are best treated with surgery. HOME CARE INSTRUCTIONS   Do not use any tobacco products including cigarettes, chewing tobacco, or electronic cigarettes.  Take one baby or adult aspirin daily, if your health care provider advises. This helps reduce the risk of a heart attack.  It is very important that you follow the angina treatment prescribed by your health care provider. Make arrangements for proper follow-up care.  Eat a heart healthy diet with salt and fat restrictions as advised.  Regular exercise is good for you as long as it does not cause discomfort. Do not begin any new type of exercise until you check with your health care provider.  If you are overweight, you should lose weight.  Try to maintain normal blood lipid levels.  Keep your blood pressure under control as recommended by your health care provider.  You should tell your health care provider right away about any increase in the severity or frequency of your chest discomfort or angina attacks. When you have angina, you  should stop what you are doing and sit down. This may bring relief in 3 to 5 minutes. If your health care provider has prescribed nitro, take it as directed.  If your health care provider has given you a follow-up appointment, it is very important to keep that appointment. Not keeping the appointment could result in a chronic or permanent injury, pain, and disability. If there is any problem keeping the appointment, you must call back to this facility for assistance. SEEK IMMEDIATE MEDICAL CARE IF:   You develop nausea, vomiting, or shortness of breath.  You feel faint, lightheaded, or pass out.  Your chest discomfort gets worse.  You are sweating or experience sudden profound fatigue.  You do not get relief of your chest pain after 3 doses of nitro.  Your discomfort lasts longer than 15 minutes. MAKE SURE YOU:   Understand these instructions.  Will watch your condition.  Will get help right away if you are not doing well or get worse.  Take all medicines as directed by your health care provider. Document Released: 09/13/2005 Document Revised: 09/18/2013 Document Reviewed: 01/15/2014 [facility] Patient Information 2015 [place], [place]. This information is not intended to replace advice given to you by your health care provider. Make sure you discuss any questions you have with your health care provider.

## 2014-10-23 NOTE — Discharge Summary (Signed)
Physician Discharge Summary       Patient ID: [name] MRN: [id number] DOB/AGE: [birth year] 59 y.o.  Admit date: 10/22/2014 Discharge date: 10/23/2014 Primary Cardiologist:Dr. 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 Procedures: 10/22/14 cardiac cath with Dr. Eldridge DaceVaranasi  -- Severe disease in the proximal and distal left circumflex artery and its branches. The distal circumflex was stented with a 2.25 x 12 drug-eluting stent. The proximal circumflex was stented with a 3.0 x 16 Synergy drug-eluting stent. 2. Severe disease in the proximal right coronary artery which was treated with a 2.75 x 20 Synergy drug-eluting stent.   Hospital Course: 59 yo BM transferred from [facility] ED for evaluation of NSTEMI. He reports he was watching TV at 3 am 10/22/14 when he developed substernal chest pain described as burning. He took Pepcid without improvement. Pain radiated to right arm. No nausea, diaphoresis, or dyspnea. Went to ED. Ecg without acute change. troponins were positive at 0.57. He had similar episodes of chest pain on 09/23/14 and again 9 days prior to admit. He went to the ED on the first occasion and was treated for GERD.  Troponins at that time were normal. His episode 9 days ago was actually more severe but resolved after 30 minutes and he didn't seek attention.   He has a history of prior cardiac cath in March 2000 that showed 30% ostial left main disease but otherwise normal. No cardiac follow up since then. He has a history of DM type 2, HL, and HTN. History of tobacco abuse but reports he quit 6 months ago.   [facility] and [facility] cardiac cath  with Dr. Eldridge DaceVaranasi.   EF 50% and significant disease undergoing PCI to Proximal and distal LCX with synergy DES.  Also Proximal RCA disease with Synergy DES to this area.  Pt tolerated the procedure without problems.  The next morning he has been seen by Dr. Elease HashimotoNahser and found stable for discharge.  He ambulated with cardiac rehab and multiple questions answered.  His diabetes needs close management.  His blood pressure has not been well controlled - meds adjusted.   Dietician did see pt prior to discharge.   Pt will be seen in close follow up and we have recommended to obtain PCP, his recently retired.  Continue dual antiplatelet therapy for at least a year without interruption. He'll need aggressive secondary prevention including weight loss, blood pressure control, diabetes control and lipid lowering therapy.  Consults: cardiology  Significant Diagnostic Studies:  BMET    Component Value Date/Time   NA 136 10/23/2014 0248   K 3.8 10/23/2014 0248   CL 104 10/23/2014 0248   CO2 25 10/23/2014 0248   GLUCOSE 165* 10/23/2014 0248   BUN 7 10/23/2014 0248   CREATININE 1.12 10/23/2014 0248   CALCIUM 8.8 10/23/2014 0248   GFRNONAA 72* 10/23/2014 0248   GFRAA 83* 10/23/2014 0248    CBC    Component Value Date/Time   WBC 6.2 10/23/2014 0248   RBC 4.64 10/23/2014 0248   HGB 14.0 10/23/2014 0248   HCT 41.5 10/23/2014 0248   PLT 192 10/23/2014 0248   MCV  89.4 10/23/2014 0248   MCH 30.2 10/23/2014 0248   MCHC 33.7 10/23/2014 0248   RDW 14.5 10/23/2014 0248   LYMPHSABS 3.4 10/22/2014 0212   MONOABS 0.6 10/22/2014 0212   EOSABS 0.2 10/22/2014 0212   BASOSABS 0.1 10/22/2014 0212    Troponin  Pk 0.79   Cardiac Cath: HEMODYNAMICS: Aortic pressure was 136/89; LV pressure was 136/11; LVEDP 20. There was no gradient between the left ventricle and aorta.   ANGIOGRAPHIC DATA: The left main coronary artery is widely patent.  The left anterior descending artery is a large vessel which wraps  around the apex. There is minimal atherosclerosis throughout the vessel. There is a large diagonal vessel which is widely patent.  The left circumflex artery is a large vessel. Proximally, there is an 80% stenosis. In the distal vessel, there is a 95% stenosis which is most likely the culprit for his presentation today. There are 2 medium-size. obtuse marginal vessels which appear widely patent. The first obtuse marginal, there is a 75% stenosis. Although this vessel is long, it appears to be less than 2 mm in diameter.  The right coronary artery is a large, dominant vessel. In the proximal vessel, there is a 90% lesion. The posterior lateral artery is large and widely patent. The posterior descending artery is small but patent.  LEFT VENTRICULOGRAM: Left ventricular angiogram was done in the 30 RAO projection and revealed overall normal systolic function with an estimated ejection fraction of 50%. There is basal inferior hypokinesis. LVEDP was 20 mmHg.  PCI NARRATIVE: ACLS 3.0 guiding catheter was used to engage left main. IV heparin and IV tirofiban were used for anticoagulation. A wire was placed down the circumflex. Distal vessel was predilated with a 2.0 x 12 balloon. The proximal lesion was predilated with the same balloon. The distal lesion was stented with a 2.25 x 12 Synergy drug-eluting stent to high pressure. The proximal lesion was then stented with a 3.0 x 16 Synergy drug-eluting stent to high pressure. The proximal stent was postdilated with a 3.25 x 12 noncompliant balloon to high pressure. There was an excellent angiographic result. The patient did have typical angina symptoms with balloon inflations.  A JR4 guiding catheter was used to engage the RCA. A pro-water wire was placed across the lesion in the proximal RCA. The same balloon was used to predilate. A 2.75 x 20 Synergy drug-eluting stent was then deployed at high pressure to over 3 mm in diameter. At the distal edge of the  stent, there is some vasospasm. Intracoronary nitroglycerin was given. The spasm resolved. There was an excellent angiographic result with no residual stenosis. The patient tolerated the procedure well. 3. Normal left main coronary artery. 4. Mild , nonobstructive atherosclerosis left anterior descending artery and its branches. 5. 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 The distal circumflex was stented with a 2.25 x 12 drug-eluting stent. The proximal circumflex was stented with a 3.0 x 16 Synergy drug-eluting stent. 6. Severe disease in the proximal right coronary artery which was treated with a 2.75 x 20 Synergy drug-eluting stent. 7. Overall normal left ventricular systolic function despite basal inferior hypokinesis. LVEDP 20 mmHg. Ejection fraction 50%.    CHEST - 1 VIEW COMPARISON: 09/23/2014 FINDINGS: Shallow inspiration with elevation of the right hemidiaphragm. The heart size and mediastinal contours are within normal limits. Both lungs are clear. The visualized skeletal structures are unremarkable. IMPRESSION: No active disease  EKG: Normal sinus rhythm T  wave abnormality, consider lateral ischemia Prolonged QT Abnormal ECG  Discharge Exam: Blood pressure 120/66, pulse 98, temperature 98.2 F (36.8 C), temperature source Oral, resp. rate 25, height 5' 10.5" (1.791 m), weight 295 lb 6.7 oz (134 kg), SpO2 100 %.  Disposition: 01-Home or Self Care      Discharge Instructions    Amb Referral to Cardiac Rehabilitation    Complete by:  As directed             Medication List    TAKE these medications        acetaminophen 325 MG tablet  Commonly known as:  TYLENOL  Take 2 tablets (650 mg total) by mouth every 4 (four) hours as needed for headache or mild pain.     aspirin EC 81 MG tablet  Take 81 mg by mouth daily.     atorvastatin 80 MG tablet  Commonly known as:  LIPITOR  Take 1 tablet (80 mg total) by mouth  daily.     lisinopril-hydrochlorothiazide 10-12.5 MG per tablet  Commonly known as:  PRINZIDE  Take 1 tablet by mouth daily.     metFORMIN 500 MG tablet  Commonly known as:  GLUCOPHAGE  Take 1 tablet (500 mg total) by mouth 2 (two) times daily with a meal.  Start taking on:  10/25/2014     metoprolol tartrate 25 MG tablet  Commonly known as:  LOPRESSOR  Take 1 tablet (25 mg total) by mouth 2 (two) times daily.     nitroGLYCERIN 0.4 MG SL tablet  Commonly known as:  NITROSTAT  Place 1 tablet (0.4 mg total) under the tongue every 5 (five) minutes as needed for chest pain.     oxyCODONE-acetaminophen 5-325 MG per tablet  Commonly known as:  PERCOCET/ROXICET  1 or 2 tabs PO q6h prn pain     pantoprazole 40 MG tablet  Commonly known as:  PROTONIX  Take 1 tablet (40 mg total) by mouth daily.     ticagrelor 90 MG Tabs tablet  Commonly known as:  BRILINTA  Take 1 tablet (90 mg total) by mouth 2 (two) times daily.       Follow-up Information    Follow up with Peter Swaziland, MD On 11/11/2014.   Specialty:  Cardiology   Why:  at 10:00 AM with Nada Boozer, NP-C   Contact information:   [address] [place] [address] [phone]       Follow up with Peter Swaziland, MD On 01/06/2015.   Specialty:  Cardiology   Why:  at 2:15 pm   Contact information:   [address] [place] [address] [phone]        Discharge Instructions: Continue the asprin and brilinta- they are to keep you stent open.  Stopping could cause a heart attack.  Call [facility] at [phone] if any bleeding, swelling or drainage at cath site.  May shower, no tub baths for 48 hours for groin sticks. No lifting over 5 pounds for 5 days.  No Driving for 5 days.    Take 1 NTG, under your tongue, while sitting.  If no relief of pain may repeat NTG, one tab every 5 minutes up to 3 tablets total over 15 minutes.  If no relief CALL 911.  If you have  dizziness/lightheadness  while taking NTG, stop taking and call 911.        Call if any problems or questions.  Heart Healthy diabetic diet.  No smoking you are doing a good job stopping Exercise per cardiac rehab  You need a primary care doctor to manage your diabetes and other medical problems.    No work for 2 weeks. No Metformin until the 29th of Jan. It may interact with cath dye.  Signed: Leone Brand Nurse Practitioner-Certified [employer]: [facility] 10/23/2014, 1:10 PM  Time spent on discharge : > 35 minutes.     Attending Note:   The patient was seen and examined.  Agree with assessment and plan as noted above.  Changes made to the above note as needed.  See my note from earlier today.   Vesta Mixer, Montez Hageman., MD, [facility] 10/23/2014, 4:47 PM [address],  [address] Office [phone] Pager [phone]

## 2014-10-29 ENCOUNTER — Telehealth: Payer: Self-pay | Admitting: Cardiology

## 2014-10-29 ENCOUNTER — Encounter (HOSPITAL_COMMUNITY): Payer: Self-pay

## 2014-10-29 ENCOUNTER — Encounter (HOSPITAL_COMMUNITY)
Admission: RE | Admit: 2014-10-29 | Discharge: 2014-10-29 | Disposition: A | Discharge status: Home / Self Care | Payer: Medicare Other | Source: Ambulatory Visit | Attending: Cardiology | Admitting: Cardiology

## 2014-10-29 VITALS — BP 112/70 | HR 75 | Ht 70.0 in | Wt 289.1 lb

## 2014-10-29 DIAGNOSIS — Z955 Presence of coronary angioplasty implant and graft: Secondary | ICD-10-CM | POA: Insufficient documentation

## 2014-10-29 DIAGNOSIS — I214 Non-ST elevation (NSTEMI) myocardial infarction: Secondary | ICD-10-CM | POA: Insufficient documentation

## 2014-10-29 NOTE — Telephone Encounter (Signed)
Spoke with [name], the patient is requesting to be seen sooner than 11-11-14. He is having trouble with erectile dysfunction that he feels is related to metoprolol. Ask the patient to call the office to discuss. No sooner appointments available.

## 2014-10-29 NOTE — Progress Notes (Signed)
Cardiac/Pulmonary Rehab Medication Review by a Pharmacist  Does the patient  feel that his/her medications are working for him/her?  yes  Has the patient been experiencing any side effects to the medications prescribed?  Yes.  Patient complains of impotence.  This is most likely related to new start of metoprolol.  Discussed importance of taking this medication.  Cardiac rehab to contact Dr Elvis CoilJordan's office so he can discuss possible options with him.  Does the patient measure his/her own blood pressure or blood glucose at home?  no   Does the patient have any problems obtaining medications due to transportation or finances?   no  Understanding of regimen: Patient had some difficulty with recall, but overall had excellent to good understanding of medications. Understanding of indications: excellent Potential of compliance: good  Questions asked to Determine Patient Understanding of Medication Regimen:  1. What is the name of the medication?  2. What is the medication used for?  3. When should it be taken?  4. How much should be taken?  5. How will you take it?  6. What side effects should you report?  Understanding Defined as: Excellent: All questions above are correct Good: Questions 1-4 are correct Fair: Questions 1-2 are correct  Poor: 1 or none of the above questions are correct   Pharmacist comments:  Patient has NTG tablets at home and understands how to use them, however does not carry them with him.  Discussed importance of having these with him when he leaves the house.  Patient was discharged on Protonix, which he takes, but states he does not have reflux.  Consider d/c this medication if not needed. Patient to discuss with MD.  See comments above about metoprolol side effect.    [name], [name] 10/29/2014 3:44 PM

## 2014-10-29 NOTE — Progress Notes (Signed)
Patient was referred to CR by Dr. SwazilandJordan post NSTEMI I21.4 and Coronary Stent Z95.5. During orientation advised patient on arrival and appointment times what to wear, what to do before, during and after exercise. Reviewed attendance and class policy. Talked about inclement weather and class consultation policy. Pt is scheduled to start Cardiac Rehab on 11/04/14 at 2:30pm. Pt was advised to come to class 5 minutes before class starts. He was also given instructions on meeting with the dietician and attending the Family Structure classes. Pt is eager to get started. Patient was able to complete 6 minute walk test.

## 2014-10-29 NOTE — Patient Instructions (Signed)
Pt has finished orientation and is scheduled to start CR on 11/04/14 at 2:30 pm. Pt has been instructed to arrive to class 15 minutes early for scheduled class. Pt has been instructed to wear comfortable clothing and shoes with rubber soles. Pt has been told to take their medications 1 hour prior to coming to class.  If the patient is not going to attend class, he/she has been instructed to call.

## 2014-10-30 ENCOUNTER — Telehealth: Payer: Self-pay | Admitting: Cardiology

## 2014-10-30 NOTE — Telephone Encounter (Signed)
Let's try reducing dose to 12.5 mg bid then see on his follow up office visit.  Wannetta Langland SwazilandJordan MD, [facility]

## 2014-10-30 NOTE — Telephone Encounter (Signed)
Returned call to patient Dr.Jordan advised to take metoprolol 12.5 mg twice a day.Advised to keep post hospital appointment with Nada BoozerLaura Ingold NP 11/11/14 at 10:00 am.

## 2014-10-30 NOTE — Addendum Note (Signed)
Addended by: Meda KlinefelterPUGH, Shawnita Krizek JOHNSON D on: 10/30/2014 02:17 PM   Modules accepted: Orders, Medications

## 2014-10-30 NOTE — Telephone Encounter (Signed)
Please call,concerning his medicine,

## 2014-10-30 NOTE — Telephone Encounter (Signed)
Returned call to patient he stated he is having erectile dysfunction.Stated he wanted to ask Dr.Jordan if he could change lopressor to a different medication.Message sent to Dr.Jordan for advice.

## 2014-10-31 DIAGNOSIS — Z0001 Encounter for general adult medical examination with abnormal findings: Secondary | ICD-10-CM | POA: Diagnosis not present

## 2014-10-31 DIAGNOSIS — Z6841 Body Mass Index (BMI) 40.0 and over, adult: Secondary | ICD-10-CM | POA: Diagnosis not present

## 2014-10-31 DIAGNOSIS — E1165 Type 2 diabetes mellitus with hyperglycemia: Secondary | ICD-10-CM | POA: Diagnosis not present

## 2014-11-04 ENCOUNTER — Encounter (HOSPITAL_COMMUNITY)
Admission: RE | Admit: 2014-11-04 | Discharge: 2014-11-04 | Disposition: A | Discharge status: Home / Self Care | Payer: Medicare Other | Source: Ambulatory Visit | Attending: Cardiology | Admitting: Cardiology

## 2014-11-04 DIAGNOSIS — Z955 Presence of coronary angioplasty implant and graft: Secondary | ICD-10-CM | POA: Diagnosis not present

## 2014-11-04 DIAGNOSIS — I214 Non-ST elevation (NSTEMI) myocardial infarction: Secondary | ICD-10-CM | POA: Diagnosis not present

## 2014-11-06 ENCOUNTER — Encounter (HOSPITAL_COMMUNITY)
Admission: RE | Admit: 2014-11-06 | Discharge: 2014-11-06 | Disposition: A | Discharge status: Home / Self Care | Payer: Medicare Other | Source: Ambulatory Visit | Attending: Cardiology | Admitting: Cardiology

## 2014-11-06 DIAGNOSIS — Z955 Presence of coronary angioplasty implant and graft: Secondary | ICD-10-CM | POA: Diagnosis not present

## 2014-11-06 DIAGNOSIS — I214 Non-ST elevation (NSTEMI) myocardial infarction: Secondary | ICD-10-CM | POA: Diagnosis not present

## 2014-11-08 ENCOUNTER — Encounter (HOSPITAL_COMMUNITY): Payer: Medicare Other

## 2014-11-11 ENCOUNTER — Ambulatory Visit: Payer: Medicare Other | Admitting: Cardiology

## 2014-11-11 ENCOUNTER — Telehealth: Payer: Self-pay | Admitting: Cardiology

## 2014-11-11 ENCOUNTER — Encounter (HOSPITAL_COMMUNITY): Admission: RE | Admit: 2014-11-11 | Payer: Medicare Other | Source: Ambulatory Visit

## 2014-11-11 NOTE — Telephone Encounter (Signed)
Pt have a big bruise on his right arm. Pt said he does not remember hitting it. Pt says he is very concerned about this.

## 2014-11-11 NOTE — Telephone Encounter (Signed)
Pt is concerned d/t the fact he has a "silver dollar sized, burgundy/purplish bruise" on his right forearm. Patient recently started Brilinta, advised him of the side effects of this medication. Pt will continue to monitor. Has appt on 3/9.

## 2014-11-12 ENCOUNTER — Ambulatory Visit (INDEPENDENT_AMBULATORY_CARE_PROVIDER_SITE_OTHER): Payer: Medicare Other | Admitting: Cardiology

## 2014-11-12 ENCOUNTER — Encounter: Payer: Self-pay | Admitting: Cardiology

## 2014-11-12 VITALS — BP 132/80 | HR 70 | Ht 70.5 in | Wt 294.2 lb

## 2014-11-12 DIAGNOSIS — I2510 Atherosclerotic heart disease of native coronary artery without angina pectoris: Secondary | ICD-10-CM

## 2014-11-12 MED ORDER — LISINOPRIL-HYDROCHLOROTHIAZIDE 10-12.5 MG PO TABS
1.0000 | ORAL_TABLET | Freq: Every day | ORAL | Status: DC
Start: 2014-11-12 — End: 2024-08-08

## 2014-11-12 NOTE — Progress Notes (Signed)
Cardiology Office Note   Date:  11/12/2014   ID:  [name], DOB 03/11/1956, MRN [id number]  PCP:  Phillip RuthsMCGOUGH,WILLIAM M, MD  Cardiologist:  Dr. P. [name]     Chief Complaint  Patient presents with  . Hospitalization Follow-up    patient reports leg pain when walking, big bruise on right arm-not aware that he hit something, blood pressure medication causes ED.       History of Present Illness: 1. [name] is a 59 y.o. male who presents for follow up for NSTEMI, pk troponin 0.79.  He underwent cath and found severe disease in the proximal and distal left circumflex artery and its branches. The distal circumflex was stented with a 2.25 x 12 drug-eluting stent. The proximal circumflex was stented with a 3.0 x 16 Synergy drug-eluting stent.  Severe disease in the proximal right coronary artery which was treated with a 2.75 x 20 Synergy drug-eluting stent.  Other history with DM-2, HL, Htn and he stopped tobacco 6 months ago.  Plan for DAPT for 1 year at least.    He is back today for follow-up. He has no chest pain and no shortness of breath. His greatest concern is his erectile dysfunction he would like to take sildenafil but needed to have it cleared by us. He has a new primary care provider Phillip HeraldBenjamin Mann, PA at [facility].    Patient does complain of bruising on Brilinta and aspirin as well as nosebleeds. [name] is following his diabetes. Additionally the patient is on Lipitor for his coronary disease and I believe [name] distended labs we've asked for results.     Past Medical History  Diagnosis Date  . Diabetes mellitus   . Hypertension   . Chronic hip pain   . Chronic back pain   . Hyperlipidemia   . GERD (gastroesophageal reflux disease)   . CAD, multiple vessel 10/23/2014  . DM2 (diabetes mellitus, type 2) 10/23/2014  . Tobacco use, stopped 6 months ago  10/23/2014  . Hyperlipidemia LDL goal <70 10/23/2014    Past Surgical History  Procedure  Laterality Date  . Total hip arthroplasty Right   . Left heart catheterization with coronary angiogram N/A 10/22/2014    Procedure: LEFT HEART CATHETERIZATION WITH CORONARY ANGIOGRAM;  Surgeon: Corky CraftsJayadeep S Varanasi, MD;  [facility];  Service: Cardiovascular;  Laterality: N/A;  . Percutaneous coronary stent intervention (pci-s)  10/22/2014    Procedure: PERCUTANEOUS CORONARY STENT INTERVENTION (PCI-S);  Surgeon: Corky CraftsJayadeep S Varanasi, MD;  [facility];  Service: Cardiovascular;;  x3 to prox RCA , Prox CFX and dist CFX     Current Outpatient Prescriptions  Medication Sig Dispense Refill  . acetaminophen (TYLENOL) 325 MG tablet Take 2 tablets (650 mg total) by mouth every 4 (four) hours as needed for headache or mild pain.    Marland Kitchen. aspirin EC 81 MG tablet Take 81 mg by mouth daily.    Marland Kitchen. atorvastatin (LIPITOR) 80 MG tablet Take 1 tablet (80 mg total) by mouth daily. 30 tablet 6  . lisinopril-hydrochlorothiazide (PRINZIDE) 10-12.5 MG per tablet Take 1 tablet by mouth daily. 30 tablet 2  . metFORMIN (GLUCOPHAGE) 500 MG tablet Take 1 tablet (500 mg total) by mouth 2 (two) times daily with a meal. 60 tablet 2  . metoprolol tartrate (LOPRESSOR) 25 MG tablet Take 12.5 mg by mouth 2 (two) times daily.  60 tablet 6  . nitroGLYCERIN (NITROSTAT) 0.4 MG SL tablet Place 1  tablet (0.4 mg total) under the tongue every 5 (five) minutes as needed for chest pain. 25 tablet 4  . oxyCODONE-acetaminophen (PERCOCET/ROXICET) 5-325 MG per tablet 1 or 2 tabs PO q6h prn pain 20 tablet 0  . ticagrelor (BRILINTA) 90 MG TABS tablet Take 1 tablet (90 mg total) by mouth 2 (two) times daily. 60 tablet 11   No current facility-administered medications for this visit.    Allergies:   Review of patient's allergies indicates no known allergies.    Social History:  The patient  reports that he has quit smoking. His smoking use included Cigarettes. He started smoking about 6 months ago. He does not have any smokeless  tobacco history on file. He reports that he drinks alcohol. He reports that he does not use illicit drugs.   Family History:  The patient's family history includes Alzheimer's disease in his mother; Heart disease in his father and sister.    ROS:  General:no colds or fevers, no weight changes Skin:no rashes or ulcers HEENT:no blurred vision, no congestion CV:see HPI PUL:see HPI GI:no diarrhea constipation or melena, no indigestion GU:no hematuria, no dysuria, + ED MS:no joint pain, no claudication Neuro:no syncope, no lightheadedness Endo:+ diabetes, no thyroid disease    Wt Readings from Last 3 Encounters:  11/12/14 294 lb 3.2 oz (133.448 kg)  10/23/14 295 lb 6.7 oz (134 kg)  09/23/14 270 lb (122.471 kg)     PHYSICAL EXAM: VS:  BP 132/80 mmHg  Pulse 70  Ht 5' 10.5" (1.791 [name])  Wt 294 lb 3.2 oz (133.448 kg)  BMI 41.60 kg/m2 , BMI Body mass index is 41.6 kg/([name]^2). General:Pleasant affect, NAD Skin:Warm and dry, brisk capillary refill. + bruising rt forearm  HEENT:normocephalic, sclera clear, mucus membranes moist Neck:supple, no JVD, no bruits  Heart:S1S2 RRR without murmur, gallup, rub or click Lungs:clear without rales, rhonchi, or wheezes ZOX:WRUE, non tender, + BS, do not palpate liver spleen or masses Ext:no lower ext edema, 2+ pedal pulses, 2+ radial pulses Neuro:alert and oriented X 3, MAE, follows commands, + facial symmetry  EKG:  EKG is ordered today. The ekg ordered today demonstrates  SR no changes from 10/23/14  Recent Labs: 10/22/2014: ALT 80* 10/23/2014: BUN 7; Creatinine 1.12; Hemoglobin 14.0; Platelets 192; Potassium 3.8; Sodium 136    Lipid Panel No results found for: CHOL, TRIG, HDL, CHOLHDL, VLDL, LDLCALC, LDLDIRECT     Other studies Reviewed: Additional studies/ records that were reviewed today include: discharge summary, cath report..   ASSESSMENT AND PLAN:  1.  CAD with recent stents.  Continue Ace, beta blocker and statin as well as  Brilinta and aspirin. We'll send no intact to Dr. Swaziland to get his opinion on changing to Plavix if there were be less bruising. Otherwise he'll follow-up with Dr. Swaziland in April as previously planned.  2. Hyperlipidemia need lipid panel in the near future to further evaluate his cholesterol.  3. NSTEMI recently hospitalized with stents placed.  4. Epistasis secondary to aspirin and Brilinta most likely along with bruising.  5. ED will check with Dr. Swaziland to add Sildenafil - I did instruct no NTG for 48-72 hours after taking.   Current medicines are reviewed  with the patient today.  The patient is concerned about his nose bleeds and bruising on DAPT. The following changes have been made:  See above  Labs/ tests ordered today include:see above  Disposition:   FU:  see above  Signed, Latricia Cerrito R, NP  11/12/2014 8:38 AM  [place], [place], [place] Phone: [phone]; Fax: [phone]

## 2014-11-12 NOTE — Patient Instructions (Signed)
Your physician recommends that you schedule a follow-up appointment in: WITH DR SwazilandJORDAN AS SCHEDULED

## 2014-11-13 ENCOUNTER — Encounter (HOSPITAL_COMMUNITY)
Admission: RE | Admit: 2014-11-13 | Discharge: 2014-11-13 | Disposition: A | Discharge status: Home / Self Care | Payer: Medicare Other | Source: Ambulatory Visit | Attending: Cardiology | Admitting: Cardiology

## 2014-11-13 DIAGNOSIS — I214 Non-ST elevation (NSTEMI) myocardial infarction: Secondary | ICD-10-CM | POA: Diagnosis not present

## 2014-11-13 DIAGNOSIS — Z955 Presence of coronary angioplasty implant and graft: Secondary | ICD-10-CM | POA: Diagnosis not present

## 2014-11-14 ENCOUNTER — Encounter: Payer: Self-pay | Admitting: Cardiology

## 2014-11-15 ENCOUNTER — Encounter (HOSPITAL_COMMUNITY): Payer: Medicare Other

## 2014-11-15 ENCOUNTER — Telehealth: Payer: Self-pay

## 2014-11-15 NOTE — Telephone Encounter (Signed)
Patient called received a message from Nada BoozerLaura Ingold NP. She advised continue Brilinta.She will let PCP know ok for ED drugs,just no NTG and no alpha blockers, Flomax within 72 hours of taking ED drug.

## 2014-11-18 ENCOUNTER — Encounter (HOSPITAL_COMMUNITY)
Admission: RE | Admit: 2014-11-18 | Discharge: 2014-11-18 | Disposition: A | Discharge status: Home / Self Care | Payer: Medicare Other | Source: Ambulatory Visit | Attending: Cardiology | Admitting: Cardiology

## 2014-11-18 ENCOUNTER — Telehealth: Payer: Self-pay

## 2014-11-18 DIAGNOSIS — Z955 Presence of coronary angioplasty implant and graft: Secondary | ICD-10-CM | POA: Diagnosis not present

## 2014-11-18 DIAGNOSIS — I214 Non-ST elevation (NSTEMI) myocardial infarction: Secondary | ICD-10-CM | POA: Diagnosis not present

## 2014-11-18 NOTE — Telephone Encounter (Signed)
Spoke to SnellvilleFaye with Romeo AppleBen Mann's office advised ok for ED drugs.Advised no NTG or no alpha blockers Flomax within 72 hours after taking ED drug.

## 2014-11-20 ENCOUNTER — Encounter (HOSPITAL_COMMUNITY)
Admission: RE | Admit: 2014-11-20 | Discharge: 2014-11-20 | Disposition: A | Discharge status: Home / Self Care | Payer: Medicare Other | Source: Ambulatory Visit | Attending: Cardiology | Admitting: Cardiology

## 2014-11-20 DIAGNOSIS — Z955 Presence of coronary angioplasty implant and graft: Secondary | ICD-10-CM | POA: Diagnosis not present

## 2014-11-20 DIAGNOSIS — I214 Non-ST elevation (NSTEMI) myocardial infarction: Secondary | ICD-10-CM | POA: Diagnosis not present

## 2014-11-22 ENCOUNTER — Encounter (HOSPITAL_COMMUNITY)
Admission: RE | Admit: 2014-11-22 | Discharge: 2014-11-22 | Disposition: A | Discharge status: Home / Self Care | Payer: Medicare Other | Source: Ambulatory Visit | Attending: Cardiology | Admitting: Cardiology

## 2014-11-22 DIAGNOSIS — I214 Non-ST elevation (NSTEMI) myocardial infarction: Secondary | ICD-10-CM | POA: Diagnosis not present

## 2014-11-22 DIAGNOSIS — Z955 Presence of coronary angioplasty implant and graft: Secondary | ICD-10-CM | POA: Diagnosis not present

## 2014-11-22 NOTE — Progress Notes (Deleted)
Patient is discharged from [facility] today, November 22, 2014 with 36 sessions.  He achieved LTG of 30 minutes of aerobic exercise at max met level of 3.68.  All patient vitals are WNL.  Patient has met with dietician.  Discharge instructions have been reviewed in detail and patient expressed an understanding of material given.  Patient plans to exercise at home. Cardiac Rehab will make 1 month, 6 month and 1 year call backs.  Patient had no complaints of any abnormal S/S or pain on their exit visit.   

## 2014-11-22 NOTE — Progress Notes (Deleted)
Cardiac Rehabilitation Program Outcomes Report   Orientation:  08/15/14 Graduate Date:  11/22/14 Discharge Date:  11/22/14 # of sessions completed: 36  Cardiologist: Dr. Royann Shiversroitoru Family MD:  Dr. Glenetta Hewapper Class Time:  1100  A.  Exercise Program:  Tolerates exercise @ 3.68 METS for 30 minutes, Improved functional capacity  6.56 %, Improved  muscular strength  3.29 %, Improved  flexibility 4.17 % and Discharged  B.  Mental Health:  Good mental attitude  C.  Education/Instruction/Skills  Accurately checks own pulse.  Rest:  68  Exercise:  89, Knows THR for exercise, Uses Perceived Exertion Scale and/or Dyspnea Scale and Attended 100% education classes   D.  Nutrition/Weight Control/Body Composition:  Adherence to prescribed nutrition program: good    E.  Blood Lipids   No results found for: CHOL, HDL, LDLCALC, LDLDIRECT, TRIG, CHOLHDL  F.  Lifestyle Changes:  Making positive lifestyle changes  G.  Symptoms noted with exercise:  Asymptomatic  Report Completed By:  Santiago BurElizabeth Skyy Nilan, RN   Comments:  Graduation note.  Pt plans to exercise at home.  CR will make 1 month, 6 month, and 1 year call backs.

## 2014-11-25 ENCOUNTER — Encounter (HOSPITAL_COMMUNITY): Payer: Medicare Other

## 2014-11-25 ENCOUNTER — Encounter (HOSPITAL_COMMUNITY)
Admission: RE | Admit: 2014-11-25 | Discharge: 2014-11-25 | Disposition: A | Discharge status: Home / Self Care | Payer: Medicare Other | Source: Ambulatory Visit | Attending: Cardiology | Admitting: Cardiology

## 2014-11-25 DIAGNOSIS — I214 Non-ST elevation (NSTEMI) myocardial infarction: Secondary | ICD-10-CM | POA: Diagnosis not present

## 2014-11-25 DIAGNOSIS — Z955 Presence of coronary angioplasty implant and graft: Secondary | ICD-10-CM | POA: Diagnosis not present

## 2014-11-27 ENCOUNTER — Encounter (HOSPITAL_COMMUNITY)
Admission: RE | Admit: 2014-11-27 | Discharge: 2014-11-27 | Disposition: A | Discharge status: Home / Self Care | Payer: Medicare Other | Source: Ambulatory Visit | Attending: Cardiology | Admitting: Cardiology

## 2014-11-27 DIAGNOSIS — I214 Non-ST elevation (NSTEMI) myocardial infarction: Secondary | ICD-10-CM | POA: Insufficient documentation

## 2014-11-27 DIAGNOSIS — Z955 Presence of coronary angioplasty implant and graft: Secondary | ICD-10-CM | POA: Diagnosis not present

## 2014-11-28 NOTE — Progress Notes (Addendum)
Cardiac Rehabilitation Program Outcomes Report   Orientation:  10/29/14  Graduate Date:  tbd Discharge Date:  tbd # of sessions completed: 3  Cardiologist: P SwazilandJordan Family MD:  McGough Class Time:  1430  A.  Exercise Program:  Tolerates exercise @ 2.1 METS for 15 minutes and Walk Test Results:  Pre: 2.67 mets  B.  Mental Health:  Good mental attitude  C.  Education/Instruction/Skills  Accurately checks own pulse.  Rest:  77  Exercise:  120 and Knows THR for exercise  Uses Perceived Exertion Scale and/or Dyspnea Scale  D.  Nutrition/Weight Control/Body Composition:  Adherence to prescribed nutrition program: fair    E.  Blood Lipids   No results found for: CHOL, HDL, LDLCALC, LDLDIRECT, TRIG, CHOLHDL  F.  Lifestyle Changes:  Making positive lifestyle changes  G.  Symptoms noted with exercise:  Asymptomatic  Report Completed By:  Doretha Sou Canaan Holzer RN   Comments:  Patients first week note

## 2014-11-29 ENCOUNTER — Encounter (HOSPITAL_COMMUNITY)
Admission: RE | Admit: 2014-11-29 | Discharge: 2014-11-29 | Disposition: A | Discharge status: Home / Self Care | Payer: Medicare Other | Source: Ambulatory Visit | Attending: Cardiology | Admitting: Cardiology

## 2014-11-29 DIAGNOSIS — I214 Non-ST elevation (NSTEMI) myocardial infarction: Secondary | ICD-10-CM | POA: Diagnosis not present

## 2014-11-29 DIAGNOSIS — Z955 Presence of coronary angioplasty implant and graft: Secondary | ICD-10-CM | POA: Diagnosis not present

## 2014-12-02 ENCOUNTER — Encounter (HOSPITAL_COMMUNITY)
Admission: RE | Admit: 2014-12-02 | Discharge: 2014-12-02 | Disposition: A | Discharge status: Home / Self Care | Payer: Medicare Other | Source: Ambulatory Visit | Attending: Cardiology | Admitting: Cardiology

## 2014-12-02 DIAGNOSIS — Z955 Presence of coronary angioplasty implant and graft: Secondary | ICD-10-CM | POA: Diagnosis not present

## 2014-12-02 DIAGNOSIS — I214 Non-ST elevation (NSTEMI) myocardial infarction: Secondary | ICD-10-CM | POA: Diagnosis not present

## 2014-12-04 ENCOUNTER — Encounter (HOSPITAL_COMMUNITY)
Admission: RE | Admit: 2014-12-04 | Discharge: 2014-12-04 | Disposition: A | Discharge status: Home / Self Care | Payer: Medicare Other | Source: Ambulatory Visit | Attending: Cardiology | Admitting: Cardiology

## 2014-12-04 ENCOUNTER — Ambulatory Visit: Payer: Medicare Other | Admitting: Cardiology

## 2014-12-04 DIAGNOSIS — I214 Non-ST elevation (NSTEMI) myocardial infarction: Secondary | ICD-10-CM | POA: Diagnosis not present

## 2014-12-04 DIAGNOSIS — Z955 Presence of coronary angioplasty implant and graft: Secondary | ICD-10-CM | POA: Diagnosis not present

## 2014-12-06 ENCOUNTER — Encounter (HOSPITAL_COMMUNITY)
Admission: RE | Admit: 2014-12-06 | Discharge: 2014-12-06 | Disposition: A | Discharge status: Home / Self Care | Payer: Medicare Other | Source: Ambulatory Visit | Attending: Cardiology | Admitting: Cardiology

## 2014-12-06 DIAGNOSIS — Z955 Presence of coronary angioplasty implant and graft: Secondary | ICD-10-CM | POA: Diagnosis not present

## 2014-12-06 DIAGNOSIS — I214 Non-ST elevation (NSTEMI) myocardial infarction: Secondary | ICD-10-CM | POA: Diagnosis not present

## 2014-12-09 ENCOUNTER — Encounter (HOSPITAL_COMMUNITY)
Admission: RE | Admit: 2014-12-09 | Discharge: 2014-12-09 | Disposition: A | Discharge status: Home / Self Care | Payer: Medicare Other | Source: Ambulatory Visit | Attending: Cardiology | Admitting: Cardiology

## 2014-12-09 DIAGNOSIS — Z955 Presence of coronary angioplasty implant and graft: Secondary | ICD-10-CM | POA: Diagnosis not present

## 2014-12-09 DIAGNOSIS — I214 Non-ST elevation (NSTEMI) myocardial infarction: Secondary | ICD-10-CM | POA: Diagnosis not present

## 2014-12-11 ENCOUNTER — Encounter (HOSPITAL_COMMUNITY)
Admission: RE | Admit: 2014-12-11 | Discharge: 2014-12-11 | Disposition: A | Discharge status: Home / Self Care | Payer: Medicare Other | Source: Ambulatory Visit | Attending: Cardiology | Admitting: Cardiology

## 2014-12-11 DIAGNOSIS — Z955 Presence of coronary angioplasty implant and graft: Secondary | ICD-10-CM | POA: Diagnosis not present

## 2014-12-11 DIAGNOSIS — I214 Non-ST elevation (NSTEMI) myocardial infarction: Secondary | ICD-10-CM | POA: Diagnosis not present

## 2014-12-13 ENCOUNTER — Encounter (HOSPITAL_COMMUNITY)
Admission: RE | Admit: 2014-12-13 | Discharge: 2014-12-13 | Disposition: A | Discharge status: Home / Self Care | Payer: Medicare Other | Source: Ambulatory Visit | Attending: Cardiology | Admitting: Cardiology

## 2014-12-13 DIAGNOSIS — Z955 Presence of coronary angioplasty implant and graft: Secondary | ICD-10-CM | POA: Diagnosis not present

## 2014-12-13 DIAGNOSIS — I214 Non-ST elevation (NSTEMI) myocardial infarction: Secondary | ICD-10-CM | POA: Diagnosis not present

## 2014-12-16 ENCOUNTER — Encounter (HOSPITAL_COMMUNITY)
Admission: RE | Admit: 2014-12-16 | Discharge: 2014-12-16 | Disposition: A | Discharge status: Home / Self Care | Payer: Medicare Other | Source: Ambulatory Visit | Attending: Cardiology | Admitting: Cardiology

## 2014-12-16 DIAGNOSIS — I214 Non-ST elevation (NSTEMI) myocardial infarction: Secondary | ICD-10-CM | POA: Diagnosis not present

## 2014-12-16 DIAGNOSIS — Z955 Presence of coronary angioplasty implant and graft: Secondary | ICD-10-CM | POA: Diagnosis not present

## 2014-12-18 ENCOUNTER — Encounter (HOSPITAL_COMMUNITY)
Admission: RE | Admit: 2014-12-18 | Discharge: 2014-12-18 | Disposition: A | Discharge status: Home / Self Care | Payer: Medicare Other | Source: Ambulatory Visit | Attending: Cardiology | Admitting: Cardiology

## 2014-12-18 DIAGNOSIS — Z955 Presence of coronary angioplasty implant and graft: Secondary | ICD-10-CM | POA: Diagnosis not present

## 2014-12-18 DIAGNOSIS — I214 Non-ST elevation (NSTEMI) myocardial infarction: Secondary | ICD-10-CM | POA: Diagnosis not present

## 2014-12-20 ENCOUNTER — Encounter (HOSPITAL_COMMUNITY)
Admission: RE | Admit: 2014-12-20 | Discharge: 2014-12-20 | Disposition: A | Discharge status: Home / Self Care | Payer: Medicare Other | Source: Ambulatory Visit | Attending: Cardiology | Admitting: Cardiology

## 2014-12-20 DIAGNOSIS — Z955 Presence of coronary angioplasty implant and graft: Secondary | ICD-10-CM | POA: Diagnosis not present

## 2014-12-20 DIAGNOSIS — I214 Non-ST elevation (NSTEMI) myocardial infarction: Secondary | ICD-10-CM | POA: Diagnosis not present

## 2014-12-23 ENCOUNTER — Encounter (HOSPITAL_COMMUNITY)
Admission: RE | Admit: 2014-12-23 | Discharge: 2014-12-23 | Disposition: A | Discharge status: Home / Self Care | Payer: Medicare Other | Source: Ambulatory Visit | Attending: Cardiology | Admitting: Cardiology

## 2014-12-23 DIAGNOSIS — Z955 Presence of coronary angioplasty implant and graft: Secondary | ICD-10-CM | POA: Diagnosis not present

## 2014-12-23 DIAGNOSIS — I214 Non-ST elevation (NSTEMI) myocardial infarction: Secondary | ICD-10-CM | POA: Diagnosis not present

## 2014-12-25 ENCOUNTER — Encounter (HOSPITAL_COMMUNITY)
Admission: RE | Admit: 2014-12-25 | Discharge: 2014-12-25 | Disposition: A | Discharge status: Home / Self Care | Payer: Medicare Other | Source: Ambulatory Visit | Attending: Cardiology | Admitting: Cardiology

## 2014-12-25 DIAGNOSIS — Z955 Presence of coronary angioplasty implant and graft: Secondary | ICD-10-CM | POA: Diagnosis not present

## 2014-12-25 DIAGNOSIS — I214 Non-ST elevation (NSTEMI) myocardial infarction: Secondary | ICD-10-CM | POA: Diagnosis not present

## 2014-12-25 NOTE — Progress Notes (Signed)
Cardiac Rehabilitation Program Outcomes Report   Orientation:  10/29/14 Graduate Date:  tbd Discharge Date:  tbd # of sessions completed: 18  Cardiologist: P SwazilandJordan Family MD:  McGough Class Time:  230pm  A.  Exercise Program:  Tolerates exercise @ 2.60 METS for 15 minutes  B.  Mental Health:  Good mental attitude  C.  Education/Instruction/Skills  Accurately checks own pulse.  Rest:  89  Exercise:  114 and Knows THR for exercise  Uses Perceived Exertion Scale and/or Dyspnea Scale  D.  Nutrition/Weight Control/Body Composition:  Adherence to prescribed nutrition program: fair    E.  Blood Lipids   No results found for: CHOL, HDL, LDLCALC, LDLDIRECT, TRIG, CHOLHDL  F.  Lifestyle Changes:  Making positive lifestyle changes and Not smoking:  Quit 05/2014  G.  Symptoms noted with exercise:  Asymptomatic  Report Completed By:  Doretha Sou Fatin Bachicha RN   Comments:  This is patients halfway noted. Patient is doing really well in program.

## 2014-12-27 ENCOUNTER — Encounter (HOSPITAL_COMMUNITY)
Admission: RE | Admit: 2014-12-27 | Discharge: 2014-12-27 | Disposition: A | Discharge status: Home / Self Care | Payer: Medicare Other | Source: Ambulatory Visit | Attending: Cardiology | Admitting: Cardiology

## 2014-12-27 DIAGNOSIS — Z955 Presence of coronary angioplasty implant and graft: Secondary | ICD-10-CM | POA: Diagnosis not present

## 2014-12-27 DIAGNOSIS — I214 Non-ST elevation (NSTEMI) myocardial infarction: Secondary | ICD-10-CM | POA: Diagnosis not present

## 2014-12-30 ENCOUNTER — Encounter (HOSPITAL_COMMUNITY)
Admission: RE | Admit: 2014-12-30 | Discharge: 2014-12-30 | Disposition: A | Discharge status: Home / Self Care | Payer: Medicare Other | Source: Ambulatory Visit | Attending: Cardiology | Admitting: Cardiology

## 2014-12-30 DIAGNOSIS — Z955 Presence of coronary angioplasty implant and graft: Secondary | ICD-10-CM | POA: Diagnosis not present

## 2014-12-30 DIAGNOSIS — I214 Non-ST elevation (NSTEMI) myocardial infarction: Secondary | ICD-10-CM | POA: Diagnosis not present

## 2015-01-01 ENCOUNTER — Encounter (HOSPITAL_COMMUNITY)
Admission: RE | Admit: 2015-01-01 | Discharge: 2015-01-01 | Disposition: A | Discharge status: Home / Self Care | Payer: Medicare Other | Source: Ambulatory Visit | Attending: Cardiology | Admitting: Cardiology

## 2015-01-01 DIAGNOSIS — I214 Non-ST elevation (NSTEMI) myocardial infarction: Secondary | ICD-10-CM | POA: Diagnosis not present

## 2015-01-01 DIAGNOSIS — Z955 Presence of coronary angioplasty implant and graft: Secondary | ICD-10-CM | POA: Diagnosis not present

## 2015-01-03 ENCOUNTER — Encounter (HOSPITAL_COMMUNITY)
Admission: RE | Admit: 2015-01-03 | Discharge: 2015-01-03 | Disposition: A | Discharge status: Home / Self Care | Payer: Medicare Other | Source: Ambulatory Visit | Attending: Cardiology | Admitting: Cardiology

## 2015-01-03 DIAGNOSIS — I214 Non-ST elevation (NSTEMI) myocardial infarction: Secondary | ICD-10-CM | POA: Diagnosis not present

## 2015-01-03 DIAGNOSIS — Z955 Presence of coronary angioplasty implant and graft: Secondary | ICD-10-CM | POA: Diagnosis not present

## 2015-01-06 ENCOUNTER — Ambulatory Visit (INDEPENDENT_AMBULATORY_CARE_PROVIDER_SITE_OTHER): Payer: Medicare Other | Admitting: Cardiology

## 2015-01-06 ENCOUNTER — Encounter: Payer: Self-pay | Admitting: Cardiology

## 2015-01-06 ENCOUNTER — Encounter (HOSPITAL_COMMUNITY): Payer: Medicare Other

## 2015-01-06 VITALS — BP 122/70 | HR 80 | Ht 70.0 in | Wt 280.8 lb

## 2015-01-06 DIAGNOSIS — E785 Hyperlipidemia, unspecified: Secondary | ICD-10-CM

## 2015-01-06 DIAGNOSIS — I2510 Atherosclerotic heart disease of native coronary artery without angina pectoris: Secondary | ICD-10-CM | POA: Diagnosis not present

## 2015-01-06 DIAGNOSIS — E119 Type 2 diabetes mellitus without complications: Secondary | ICD-10-CM | POA: Diagnosis not present

## 2015-01-06 NOTE — Patient Instructions (Signed)
Stop taking metoprolol.  Continue your other therapy  I will see you in 6 months  Send me a copy of your lab work

## 2015-01-06 NOTE — Progress Notes (Signed)
Cardiology Office Note   Date:  01/06/2015   ID:  [name], DOB 10/23/1955, MRN [id number]  PCP:  Lenise HeraldMANN, BENJAMIN, PA-C  Cardiologist:  Dr. P. SwazilandJordan     Chief Complaint  Patient presents with  . Coronary Artery Disease      History of Present Illness: 1. [name] is a 59 y.o. male who presents for follow up CAD.  He is s/p NSTEMI in January 2016. He underwent cath and found severe disease in the proximal and distal left circumflex artery and its branches. The distal circumflex was stented with a 2.25 x 12 drug-eluting stent. The proximal circumflex was stented with a 3.0 x 16 Synergy drug-eluting stent.  Severe disease in the proximal right coronary artery which was treated with a 2.75 x 20 Synergy drug-eluting stent.  Other history with DM-2, HL, Htn and he stopped tobacco 6 months ago.  Plan for DAPT for 1 year at least.    On follow up he is doing very well.  He has no chest pain and no shortness of breath. His greatest concern is his erectile dysfunction. He states he cannot afford Viagra. He has made significant lifestyle modification. Eating healthy and has lost 14 lbs. Exercising at Cardiac Rehab. Reports blood sugars are doing well.        Past Medical History  Diagnosis Date  . Diabetes mellitus   . Hypertension   . Chronic hip pain   . Chronic back pain   . Hyperlipidemia   . GERD (gastroesophageal reflux disease)   . CAD, multiple vessel 10/23/2014  . DM2 (diabetes mellitus, type 2) 10/23/2014  . Tobacco use, stopped 6 months ago  10/23/2014  . Hyperlipidemia LDL goal <70 10/23/2014    Past Surgical History  Procedure Laterality Date  . Total hip arthroplasty Right   . Left heart catheterization with coronary angiogram N/A 10/22/2014    Procedure: LEFT HEART CATHETERIZATION WITH CORONARY ANGIOGRAM;  Surgeon: Corky CraftsJayadeep S Varanasi, MD;  [facility];  Service: Cardiovascular;  Laterality: N/A;  . Percutaneous coronary stent  intervention (pci-s)  10/22/2014    Procedure: PERCUTANEOUS CORONARY STENT INTERVENTION (PCI-S);  Surgeon: Corky CraftsJayadeep S Varanasi, MD;  [facility];  Service: Cardiovascular;;  x3 to prox RCA , Prox CFX and dist CFX     Current Outpatient Prescriptions  Medication Sig Dispense Refill  . acetaminophen (TYLENOL) 325 MG tablet Take 2 tablets (650 mg total) by mouth every 4 (four) hours as needed for headache or mild pain.    Marland Kitchen. aspirin EC 81 MG tablet Take 81 mg by mouth daily.    Marland Kitchen. atorvastatin (LIPITOR) 80 MG tablet Take 1 tablet (80 mg total) by mouth daily. 30 tablet 6  . lisinopril-hydrochlorothiazide (PRINZIDE) 10-12.5 MG per tablet Take 1 tablet by mouth daily. 30 tablet 12  . metFORMIN (GLUCOPHAGE) 500 MG tablet Take 1 tablet (500 mg total) by mouth 2 (two) times daily with a meal. 60 tablet 2  . nitroGLYCERIN (NITROSTAT) 0.4 MG SL tablet Place 1 tablet (0.4 mg total) under the tongue every 5 (five) minutes as needed for chest pain. 25 tablet 4  . oxyCODONE-acetaminophen (PERCOCET/ROXICET) 5-325 MG per tablet 1 or 2 tabs PO q6h prn pain 20 tablet 0  . ticagrelor (BRILINTA) 90 MG TABS tablet Take 1 tablet (90 mg total) by mouth 2 (two) times daily. 60 tablet 11   No current facility-administered medications for this visit.    Allergies:   Review  of patient's allergies indicates no known allergies.    Social History:  The patient  reports that he has quit smoking. His smoking use included Cigarettes. He started smoking about 8 months ago. He does not have any smokeless tobacco history on file. He reports that he drinks alcohol. He reports that he does not use illicit drugs.   Family History:  The patient's family history includes Alzheimer's disease in his mother; Heart disease in his father and sister.    ROS:  General:no colds or fevers, no weight changes Skin:no rashes or ulcers HEENT:no blurred vision, no congestion CV:see HPI PUL:see HPI GI:no diarrhea constipation or  melena, no indigestion GU:no hematuria, no dysuria, + ED MS:no joint pain, no claudication Neuro:no syncope, no lightheadedness Endo:+ diabetes, no thyroid disease    Wt Readings from Last 3 Encounters:  01/06/15 280 lb 12.8 oz (127.37 kg)  11/12/14 294 lb 3.2 oz (133.448 kg)  10/23/14 295 lb 6.7 oz (134 kg)     PHYSICAL EXAM: VS:  BP 122/70 mmHg  Pulse 80  Ht  (1.778 m)  Wt 280 lb 12.8 oz (127.37 kg)  BMI 40.29 kg/m2 , BMI Body mass index is 40.29 kg/(m^2). General:Pleasant affect, NAD Skin:Warm and dry, brisk capillary refill. + bruising rt forearm  HEENT:normocephalic, sclera clear, mucus membranes moist Neck:supple, no JVD, no bruits  Heart:S1S2 RRR without murmur, gallup, rub or click Lungs:clear without rales, rhonchi, or wheezes ZOX:WRUE, non tender, + BS, do not palpate liver spleen or masses Ext:no lower ext edema, 2+ pedal pulses, 2+ radial pulses Neuro:alert and oriented X 3, MAE, follows commands, + facial symmetry  EKG: none today  Recent Labs: 10/22/2014: ALT 80* 10/23/2014: BUN 7; Creatinine 1.12; Hemoglobin 14.0; Platelets 192; Potassium 3.8; Sodium 136    Lipid Panel No results found for: CHOL, TRIG, HDL, CHOLHDL, VLDL, LDLCALC, LDLDIRECT     Other studies Reviewed: Additional studies/ records that were reviewed today include: discharge summary, cath report..   ASSESSMENT AND PLAN:  1.  CAD s/p NSTEMI in January. S/p stents of LCx and RCA.Marland Kitchen  Continue Ace,  and statin as well as Brilinta and aspirin. We will stop metoprolol since he is on a very low dose and it is causing erectile dysfunction. BP is well controlled.   2. Hyperlipidemia labs followed by primary care. Recommend followup lipid panel and chemistries. Asked that copy of labs be sent to [place].   3.  ED   4. DM type 2.   5. History of tobacco abuse- now stopped.    Continue lifestyle modifcations. I will follow up in 6 months.

## 2015-01-08 ENCOUNTER — Encounter (HOSPITAL_COMMUNITY)
Admission: RE | Admit: 2015-01-08 | Discharge: 2015-01-08 | Disposition: A | Discharge status: Home / Self Care | Payer: Medicare Other | Source: Ambulatory Visit | Attending: Cardiology | Admitting: Cardiology

## 2015-01-08 DIAGNOSIS — E1165 Type 2 diabetes mellitus with hyperglycemia: Secondary | ICD-10-CM | POA: Diagnosis not present

## 2015-01-08 DIAGNOSIS — Z6841 Body Mass Index (BMI) 40.0 and over, adult: Secondary | ICD-10-CM | POA: Diagnosis not present

## 2015-01-08 DIAGNOSIS — E782 Mixed hyperlipidemia: Secondary | ICD-10-CM | POA: Diagnosis not present

## 2015-01-08 DIAGNOSIS — E1129 Type 2 diabetes mellitus with other diabetic kidney complication: Secondary | ICD-10-CM | POA: Diagnosis not present

## 2015-01-08 DIAGNOSIS — I214 Non-ST elevation (NSTEMI) myocardial infarction: Secondary | ICD-10-CM | POA: Diagnosis not present

## 2015-01-08 DIAGNOSIS — Z955 Presence of coronary angioplasty implant and graft: Secondary | ICD-10-CM | POA: Diagnosis not present

## 2015-01-10 ENCOUNTER — Encounter (HOSPITAL_COMMUNITY)
Admission: RE | Admit: 2015-01-10 | Discharge: 2015-01-10 | Disposition: A | Discharge status: Home / Self Care | Payer: Medicare Other | Source: Ambulatory Visit | Attending: Cardiology | Admitting: Cardiology

## 2015-01-10 DIAGNOSIS — I214 Non-ST elevation (NSTEMI) myocardial infarction: Secondary | ICD-10-CM | POA: Diagnosis not present

## 2015-01-10 DIAGNOSIS — Z955 Presence of coronary angioplasty implant and graft: Secondary | ICD-10-CM | POA: Diagnosis not present

## 2015-01-13 ENCOUNTER — Encounter (HOSPITAL_COMMUNITY)
Admission: RE | Admit: 2015-01-13 | Discharge: 2015-01-13 | Disposition: A | Discharge status: Home / Self Care | Payer: Medicare Other | Source: Ambulatory Visit | Attending: Cardiology | Admitting: Cardiology

## 2015-01-13 DIAGNOSIS — I214 Non-ST elevation (NSTEMI) myocardial infarction: Secondary | ICD-10-CM | POA: Diagnosis not present

## 2015-01-13 DIAGNOSIS — Z955 Presence of coronary angioplasty implant and graft: Secondary | ICD-10-CM | POA: Diagnosis not present

## 2015-01-14 ENCOUNTER — Encounter: Payer: Self-pay | Admitting: Cardiology

## 2015-01-15 ENCOUNTER — Encounter (HOSPITAL_COMMUNITY)
Admission: RE | Admit: 2015-01-15 | Discharge: 2015-01-15 | Disposition: A | Discharge status: Home / Self Care | Payer: Medicare Other | Source: Ambulatory Visit | Attending: Cardiology | Admitting: Cardiology

## 2015-01-15 DIAGNOSIS — Z955 Presence of coronary angioplasty implant and graft: Secondary | ICD-10-CM | POA: Diagnosis not present

## 2015-01-15 DIAGNOSIS — I214 Non-ST elevation (NSTEMI) myocardial infarction: Secondary | ICD-10-CM | POA: Diagnosis not present

## 2015-01-17 ENCOUNTER — Encounter (HOSPITAL_COMMUNITY)
Admission: RE | Admit: 2015-01-17 | Discharge: 2015-01-17 | Disposition: A | Discharge status: Home / Self Care | Payer: Medicare Other | Source: Ambulatory Visit | Attending: Cardiology | Admitting: Cardiology

## 2015-01-17 DIAGNOSIS — Z955 Presence of coronary angioplasty implant and graft: Secondary | ICD-10-CM | POA: Diagnosis not present

## 2015-01-17 DIAGNOSIS — I214 Non-ST elevation (NSTEMI) myocardial infarction: Secondary | ICD-10-CM | POA: Diagnosis not present

## 2015-01-20 ENCOUNTER — Encounter (HOSPITAL_COMMUNITY)
Admission: RE | Admit: 2015-01-20 | Discharge: 2015-01-20 | Disposition: A | Discharge status: Home / Self Care | Payer: Medicare Other | Source: Ambulatory Visit | Attending: Cardiology | Admitting: Cardiology

## 2015-01-20 DIAGNOSIS — I214 Non-ST elevation (NSTEMI) myocardial infarction: Secondary | ICD-10-CM | POA: Diagnosis not present

## 2015-01-20 DIAGNOSIS — Z955 Presence of coronary angioplasty implant and graft: Secondary | ICD-10-CM | POA: Diagnosis not present

## 2015-01-22 ENCOUNTER — Encounter (HOSPITAL_COMMUNITY)
Admission: RE | Admit: 2015-01-22 | Discharge: 2015-01-22 | Disposition: A | Discharge status: Home / Self Care | Payer: Medicare Other | Source: Ambulatory Visit | Attending: Cardiology | Admitting: Cardiology

## 2015-01-22 DIAGNOSIS — Z955 Presence of coronary angioplasty implant and graft: Secondary | ICD-10-CM | POA: Diagnosis not present

## 2015-01-22 DIAGNOSIS — I214 Non-ST elevation (NSTEMI) myocardial infarction: Secondary | ICD-10-CM | POA: Diagnosis not present

## 2015-01-24 ENCOUNTER — Encounter (HOSPITAL_COMMUNITY)
Admission: RE | Admit: 2015-01-24 | Discharge: 2015-01-24 | Disposition: A | Discharge status: Home / Self Care | Payer: Medicare Other | Source: Ambulatory Visit | Attending: Cardiology | Admitting: Cardiology

## 2015-01-24 DIAGNOSIS — Z955 Presence of coronary angioplasty implant and graft: Secondary | ICD-10-CM | POA: Diagnosis not present

## 2015-01-24 DIAGNOSIS — I214 Non-ST elevation (NSTEMI) myocardial infarction: Secondary | ICD-10-CM | POA: Diagnosis not present

## 2015-01-27 ENCOUNTER — Encounter (HOSPITAL_COMMUNITY)
Admission: RE | Admit: 2015-01-27 | Discharge: 2015-01-27 | Disposition: A | Discharge status: Home / Self Care | Payer: Medicare Other | Source: Ambulatory Visit | Attending: Cardiology | Admitting: Cardiology

## 2015-01-27 ENCOUNTER — Other Ambulatory Visit: Payer: Self-pay | Admitting: Cardiology

## 2015-01-27 DIAGNOSIS — I214 Non-ST elevation (NSTEMI) myocardial infarction: Secondary | ICD-10-CM | POA: Insufficient documentation

## 2015-01-27 DIAGNOSIS — Z955 Presence of coronary angioplasty implant and graft: Secondary | ICD-10-CM | POA: Insufficient documentation

## 2015-01-28 ENCOUNTER — Other Ambulatory Visit: Payer: Self-pay

## 2015-01-28 ENCOUNTER — Telehealth: Payer: Self-pay

## 2015-01-28 ENCOUNTER — Telehealth: Payer: Self-pay | Admitting: Cardiology

## 2015-01-28 MED ORDER — TICAGRELOR 90 MG PO TABS: 90.0000 mg | ORAL_TABLET | Freq: Two times a day (BID) | ORAL | Status: DC

## 2015-01-28 NOTE — Telephone Encounter (Signed)
We do not refill metformin please send to md

## 2015-01-28 NOTE — Telephone Encounter (Signed)
Rx(s) sent to pharmacy electronically.  

## 2015-01-28 NOTE — Telephone Encounter (Signed)
Pt says he still have not received his Brilinta and Metformin. Please call this in today to [phone].

## 2015-01-28 NOTE — Telephone Encounter (Signed)
This should be filled by primary care  Peter Jordan MD, FACC   

## 2015-01-29 ENCOUNTER — Encounter (HOSPITAL_COMMUNITY)
Admission: RE | Admit: 2015-01-29 | Discharge: 2015-01-29 | Disposition: A | Discharge status: Home / Self Care | Payer: Medicare Other | Source: Ambulatory Visit | Attending: Cardiology | Admitting: Cardiology

## 2015-01-29 DIAGNOSIS — I214 Non-ST elevation (NSTEMI) myocardial infarction: Secondary | ICD-10-CM | POA: Diagnosis not present

## 2015-01-29 DIAGNOSIS — Z955 Presence of coronary angioplasty implant and graft: Secondary | ICD-10-CM | POA: Diagnosis not present

## 2015-01-31 ENCOUNTER — Encounter (HOSPITAL_COMMUNITY)
Admission: RE | Admit: 2015-01-31 | Discharge: 2015-01-31 | Disposition: A | Discharge status: Home / Self Care | Payer: Medicare Other | Source: Ambulatory Visit | Attending: Cardiology | Admitting: Cardiology

## 2015-01-31 DIAGNOSIS — I214 Non-ST elevation (NSTEMI) myocardial infarction: Secondary | ICD-10-CM | POA: Diagnosis not present

## 2015-01-31 DIAGNOSIS — Z955 Presence of coronary angioplasty implant and graft: Secondary | ICD-10-CM | POA: Diagnosis not present

## 2015-01-31 NOTE — Progress Notes (Signed)
Cardiac Rehabilitation Program Outcomes Report   Orientation:  10/29/14 Graduate Date:  01/31/15 Discharge Date:  01/31/15 # of sessions completed: 36  Cardiologist: P SwazilandJordan Family MD:  Kevan RosebushMcGough Class Time:  1430  A.  Exercise Program:  Tolerates exercise @ 4.30 METS for 15 minutes, Walk Test Results:  Post: 3.11 mets, Improved functional capacity  16.5 % and Progressed to Phase 4 maintenance program  B.  Mental Health:  Good mental attitude  C.  Education/Instruction/Skills  Accurately checks own pulse.  Rest:  77  Exercise:  137, Knows THR for exercise, Uses Perceived Exertion Scale and/or Dyspnea Scale and Attended all education classes  Home exercise given: patient joins maintenance program next Monday.   D.  Nutrition/Weight Control/Body Composition:  Adherence to prescribed nutrition program: fair  and Evidence of fat weight loss   E.  Blood Lipids   No results found for: CHOL, HDL, LDLCALC, LDLDIRECT, TRIG, CHOLHDL  F.  Lifestyle Changes:  Making positive lifestyle changes and Not smoking:  Quit 2015  G.  Symptoms noted with exercise:  Asymptomatic  Report Completed By:   Doretha Sou Samantha Ragen RN   Comments:  Patient has graduated [facility] today. Patient has done very well in program.  Patient stated he feels a lot better since starting the program. Patient is joining maintenance next Monday.

## 2015-01-31 NOTE — Progress Notes (Signed)
Patient is discharged from [facility] program today, Jan 31, 2015 with 36 sessions.  He achieved LTG of 30 minutes of aerobic exercise at max met level of 4.30.  All patient vitals are WNL.   Discharge instructions have been reviewed in detail and patient expressed an understanding of material given.  Patient plans to join the maintenance program starting Monday, Feb 03, 2015 in the 1pm class. Cardiac Rehab will make 1 month, 6 month and 1 year call backs.  Patient had no complaints of any abnormal S/S or pain on their exit visit.  Patient able to finish post six minute walk test.

## 2015-02-03 ENCOUNTER — Encounter (HOSPITAL_COMMUNITY)
Admission: RE | Admit: 2015-02-03 | Discharge: 2015-02-03 | Disposition: A | Discharge status: Home / Self Care | Payer: Self-pay | Source: Ambulatory Visit | Attending: Cardiology | Admitting: Cardiology

## 2015-02-03 DIAGNOSIS — I252 Old myocardial infarction: Secondary | ICD-10-CM | POA: Insufficient documentation

## 2015-02-03 DIAGNOSIS — Z955 Presence of coronary angioplasty implant and graft: Secondary | ICD-10-CM | POA: Insufficient documentation

## 2015-02-05 ENCOUNTER — Encounter (HOSPITAL_COMMUNITY)
Admission: RE | Admit: 2015-02-05 | Discharge: 2015-02-05 | Disposition: A | Discharge status: Home / Self Care | Payer: Self-pay | Source: Ambulatory Visit | Attending: Cardiology | Admitting: Cardiology

## 2015-02-07 ENCOUNTER — Encounter (HOSPITAL_COMMUNITY)
Admission: RE | Admit: 2015-02-07 | Discharge: 2015-02-07 | Disposition: A | Discharge status: Home / Self Care | Payer: Self-pay | Source: Ambulatory Visit | Attending: Cardiology | Admitting: Cardiology

## 2015-02-10 ENCOUNTER — Encounter (HOSPITAL_COMMUNITY)
Admission: RE | Admit: 2015-02-10 | Discharge: 2015-02-10 | Disposition: A | Discharge status: Home / Self Care | Payer: Self-pay | Source: Ambulatory Visit | Attending: Cardiology | Admitting: Cardiology

## 2015-02-12 ENCOUNTER — Encounter (HOSPITAL_COMMUNITY)
Admission: RE | Admit: 2015-02-12 | Discharge: 2015-02-12 | Disposition: A | Discharge status: Home / Self Care | Payer: Self-pay | Source: Ambulatory Visit | Attending: Cardiology | Admitting: Cardiology

## 2015-02-14 ENCOUNTER — Encounter (HOSPITAL_COMMUNITY)
Admission: RE | Admit: 2015-02-14 | Discharge: 2015-02-14 | Disposition: A | Discharge status: Home / Self Care | Payer: Self-pay | Source: Ambulatory Visit | Attending: Cardiology | Admitting: Cardiology

## 2015-02-17 ENCOUNTER — Encounter (HOSPITAL_COMMUNITY): Admission: RE | Admit: 2015-02-17 | Payer: Self-pay | Source: Ambulatory Visit

## 2015-02-19 ENCOUNTER — Encounter (HOSPITAL_COMMUNITY)
Admission: RE | Admit: 2015-02-19 | Discharge: 2015-02-19 | Disposition: A | Discharge status: Home / Self Care | Payer: Self-pay | Source: Ambulatory Visit | Attending: Cardiology | Admitting: Cardiology

## 2015-02-21 ENCOUNTER — Encounter (HOSPITAL_COMMUNITY): Payer: Self-pay

## 2015-02-24 ENCOUNTER — Encounter (HOSPITAL_COMMUNITY): Payer: Self-pay

## 2015-02-26 ENCOUNTER — Encounter (HOSPITAL_COMMUNITY)
Admission: RE | Admit: 2015-02-26 | Discharge: 2015-02-26 | Disposition: A | Discharge status: Home / Self Care | Payer: Self-pay | Source: Ambulatory Visit | Attending: Cardiology | Admitting: Cardiology

## 2015-02-26 DIAGNOSIS — Z955 Presence of coronary angioplasty implant and graft: Secondary | ICD-10-CM | POA: Insufficient documentation

## 2015-02-26 DIAGNOSIS — I10 Essential (primary) hypertension: Secondary | ICD-10-CM | POA: Diagnosis not present

## 2015-02-26 DIAGNOSIS — G894 Chronic pain syndrome: Secondary | ICD-10-CM | POA: Diagnosis not present

## 2015-02-26 DIAGNOSIS — Z6839 Body mass index (BMI) 39.0-39.9, adult: Secondary | ICD-10-CM | POA: Diagnosis not present

## 2015-02-26 DIAGNOSIS — E119 Type 2 diabetes mellitus without complications: Secondary | ICD-10-CM | POA: Diagnosis not present

## 2015-02-26 DIAGNOSIS — I252 Old myocardial infarction: Secondary | ICD-10-CM | POA: Insufficient documentation

## 2015-02-28 ENCOUNTER — Encounter (HOSPITAL_COMMUNITY)
Admission: RE | Admit: 2015-02-28 | Discharge: 2015-02-28 | Disposition: A | Discharge status: Home / Self Care | Payer: Self-pay | Source: Ambulatory Visit | Attending: Cardiology | Admitting: Cardiology

## 2015-03-03 ENCOUNTER — Encounter (HOSPITAL_COMMUNITY)
Admission: RE | Admit: 2015-03-03 | Discharge: 2015-03-03 | Disposition: A | Discharge status: Home / Self Care | Payer: Self-pay | Source: Ambulatory Visit | Attending: Cardiology | Admitting: Cardiology

## 2015-03-05 ENCOUNTER — Encounter (HOSPITAL_COMMUNITY)
Admission: RE | Admit: 2015-03-05 | Discharge: 2015-03-05 | Disposition: A | Discharge status: Home / Self Care | Payer: Self-pay | Source: Ambulatory Visit | Attending: Cardiology | Admitting: Cardiology

## 2015-03-07 ENCOUNTER — Encounter (HOSPITAL_COMMUNITY)
Admission: RE | Admit: 2015-03-07 | Discharge: 2015-03-07 | Disposition: A | Discharge status: Home / Self Care | Payer: Self-pay | Source: Ambulatory Visit | Attending: Cardiology | Admitting: Cardiology

## 2015-03-10 ENCOUNTER — Encounter (HOSPITAL_COMMUNITY): Payer: Self-pay

## 2015-03-12 ENCOUNTER — Encounter (HOSPITAL_COMMUNITY): Payer: Self-pay

## 2015-03-14 ENCOUNTER — Encounter (HOSPITAL_COMMUNITY)
Admission: RE | Admit: 2015-03-14 | Discharge: 2015-03-14 | Disposition: A | Discharge status: Home / Self Care | Payer: Self-pay | Source: Ambulatory Visit | Attending: Cardiology | Admitting: Cardiology

## 2015-03-17 ENCOUNTER — Encounter (HOSPITAL_COMMUNITY)
Admission: RE | Admit: 2015-03-17 | Discharge: 2015-03-17 | Disposition: A | Discharge status: Home / Self Care | Payer: Self-pay | Source: Ambulatory Visit | Attending: Cardiology | Admitting: Cardiology

## 2015-03-19 ENCOUNTER — Encounter (HOSPITAL_COMMUNITY)
Admission: RE | Admit: 2015-03-19 | Discharge: 2015-03-19 | Disposition: A | Discharge status: Home / Self Care | Payer: Self-pay | Source: Ambulatory Visit | Attending: Cardiology | Admitting: Cardiology

## 2015-03-21 ENCOUNTER — Encounter (HOSPITAL_COMMUNITY): Payer: Self-pay

## 2015-03-24 ENCOUNTER — Encounter (HOSPITAL_COMMUNITY)
Admission: RE | Admit: 2015-03-24 | Discharge: 2015-03-24 | Disposition: A | Discharge status: Home / Self Care | Payer: Self-pay | Source: Ambulatory Visit | Attending: Cardiology | Admitting: Cardiology

## 2015-03-26 ENCOUNTER — Encounter (HOSPITAL_COMMUNITY): Payer: Self-pay

## 2015-03-28 ENCOUNTER — Encounter (HOSPITAL_COMMUNITY): Payer: Medicaid Other

## 2015-03-31 ENCOUNTER — Encounter (HOSPITAL_COMMUNITY): Payer: Medicaid Other

## 2015-06-17 DIAGNOSIS — Z1389 Encounter for screening for other disorder: Secondary | ICD-10-CM | POA: Diagnosis not present

## 2015-06-17 DIAGNOSIS — Z6838 Body mass index (BMI) 38.0-38.9, adult: Secondary | ICD-10-CM | POA: Diagnosis not present

## 2015-06-17 DIAGNOSIS — M1712 Unilateral primary osteoarthritis, left knee: Secondary | ICD-10-CM | POA: Diagnosis not present

## 2015-06-18 ENCOUNTER — Telehealth: Payer: Self-pay | Admitting: Cardiology

## 2015-06-18 DIAGNOSIS — M1712 Unilateral primary osteoarthritis, left knee: Secondary | ICD-10-CM | POA: Diagnosis not present

## 2015-06-18 DIAGNOSIS — Z1389 Encounter for screening for other disorder: Secondary | ICD-10-CM | POA: Diagnosis not present

## 2015-06-18 DIAGNOSIS — Z6838 Body mass index (BMI) 38.0-38.9, adult: Secondary | ICD-10-CM | POA: Diagnosis not present

## 2015-06-18 NOTE — Telephone Encounter (Signed)
Pt seen by PCP yesterday afternoon. States they took reading of BP, it was 79/56.  They did not recheck to verify BP.  Pt denied having any symptoms at the time. Pt denied any issues w/ BP - he does check this w/ home cuff, albeit sometimes infrequently.  Advised to keep daily log at home over next week - if BP low (systolic <100) call us, if symptoms call, o/w not a concern. Explained the low reading may have been a machine issue. Pt voiced understanding.

## 2015-06-18 NOTE — Telephone Encounter (Signed)
[name] is calling because his bp has dropped . Please call @ [phone] . Thanks

## 2015-08-14 ENCOUNTER — Ambulatory Visit: Payer: Medicare Other | Admitting: Cardiology

## 2015-09-04 ENCOUNTER — Encounter: Payer: Self-pay | Admitting: Cardiology

## 2015-09-04 ENCOUNTER — Ambulatory Visit (INDEPENDENT_AMBULATORY_CARE_PROVIDER_SITE_OTHER): Payer: Medicare Other | Admitting: Cardiology

## 2015-09-04 VITALS — BP 136/84 | HR 62 | Ht 70.5 in | Wt 280.2 lb

## 2015-09-04 DIAGNOSIS — E785 Hyperlipidemia, unspecified: Secondary | ICD-10-CM | POA: Diagnosis not present

## 2015-09-04 DIAGNOSIS — I2510 Atherosclerotic heart disease of native coronary artery without angina pectoris: Secondary | ICD-10-CM | POA: Diagnosis not present

## 2015-09-04 DIAGNOSIS — I214 Non-ST elevation (NSTEMI) myocardial infarction: Secondary | ICD-10-CM

## 2015-09-04 NOTE — Patient Instructions (Signed)
After October 23, 2015 you can stop taking Brilinta.  Continue your other therapy  I will see you in one year.

## 2015-09-04 NOTE — Progress Notes (Signed)
Cardiology Office Note   Date:  09/04/2015   ID:  [name], DOB 1956/09/24, MRN [id number]  PCP:  Lenise Herald, PA-C  Cardiologist:  Dr. P. [place]     Chief Complaint  Patient presents with  . Follow-up    6 month  . Coronary Artery Disease      History of Present Illness: 1. [name] is a 59 y.o. male who presents for follow up CAD.  He is s/p NSTEMI in January 2016. He underwent cath and found severe disease in the proximal and distal left circumflex artery and its branches. The distal circumflex was stented with a 2.25 x 12 drug-eluting stent. The proximal circumflex was stented with a 3.0 x 16 Synergy drug-eluting stent.  Severe disease in the proximal right coronary artery which was treated with a 2.75 x 20 Synergy drug-eluting stent.  Other history with DM-2, HL, Htn. He is no longer smoking.   On follow up he is doing very well.  He has no chest pain and no shortness of breath. He had lost down to 269 lbs  With exercise and diet. He developed pain and swelling in his left knee and this limited his activity so he gained some weight back.        Past Medical History  Diagnosis Date  . Diabetes mellitus   . Hypertension   . Chronic hip pain   . Chronic back pain   . Hyperlipidemia   . GERD (gastroesophageal reflux disease)   . CAD, multiple vessel 10/23/2014  . DM2 (diabetes mellitus, type 2) (HCC) 10/23/2014  . Tobacco use, stopped 6 months ago  10/23/2014  . Hyperlipidemia LDL goal <70 10/23/2014    Past Surgical History  Procedure Laterality Date  . Total hip arthroplasty Right   . Left heart catheterization with coronary angiogram N/A 10/22/2014    Procedure: LEFT HEART CATHETERIZATION WITH CORONARY ANGIOGRAM;  Surgeon: Corky Crafts, MD;  Location: [facility] CATH LAB;  Service: Cardiovascular;  Laterality: N/A;  . Percutaneous coronary stent intervention (pci-s)  10/22/2014    Procedure: PERCUTANEOUS CORONARY STENT INTERVENTION (PCI-S);   Surgeon: Corky Crafts, MD;  Location: [facility] CATH LAB;  Service: Cardiovascular;;  x3 to prox RCA , Prox CFX and dist CFX     Current Outpatient Prescriptions  Medication Sig Dispense Refill  . acetaminophen (TYLENOL) 325 MG tablet Take 2 tablets (650 mg total) by mouth every 4 (four) hours as needed for headache or mild pain.    Marland Kitchen aspirin EC 81 MG tablet Take 81 mg by mouth daily.    Marland Kitchen atorvastatin (LIPITOR) 80 MG tablet Take 1 tablet (80 mg total) by mouth daily. 30 tablet 6  . lisinopril-hydrochlorothiazide (PRINZIDE) 10-12.5 MG per tablet Take 1 tablet by mouth daily. 30 tablet 12  . metFORMIN (GLUCOPHAGE) 500 MG tablet Take 1 tablet (500 mg total) by mouth 2 (two) times daily with a meal. 60 tablet 2  . nitroGLYCERIN (NITROSTAT) 0.4 MG SL tablet Place 1 tablet (0.4 mg total) under the tongue every 5 (five) minutes as needed for chest pain. 25 tablet 4  . oxyCODONE-acetaminophen (PERCOCET/ROXICET) 5-325 MG per tablet 1 or 2 tabs PO q6h prn pain 20 tablet 0  . tamsulosin (FLOMAX) 0.4 MG CAPS capsule Take 0.4 mg by mouth daily.  8  . ticagrelor (BRILINTA) 90 MG TABS tablet Take 1 tablet (90 mg total) by mouth 2 (two) times daily. 60 tablet 11   No current facility-administered medications for  this visit.    Allergies:   Review of patient's allergies indicates no known allergies.    Social History:  The patient  reports that he has quit smoking. His smoking use included Cigarettes. He started smoking about 16 months ago. He does not have any smokeless tobacco history on file. He reports that he drinks alcohol. He reports that he does not use illicit drugs.   Family History:  The patient's family history includes Alzheimer's disease in his mother; Heart disease in his father and sister.    ROS:  As noted in HPI. All other systems are reviewed and are negative.    Wt Readings from Last 3 Encounters:  09/04/15 127.098 kg (280 lb 3.2 oz)  01/06/15 127.37 kg (280 lb 12.8 oz)  11/12/14  133.448 kg (294 lb 3.2 oz)     PHYSICAL EXAM: VS:  BP 136/84 mmHg  Pulse 62  Ht 5' 10.5" (1.791 m)  Wt 127.098 kg (280 lb 3.2 oz)  BMI 39.62 kg/m2 , BMI Body mass index is 39.62 kg/(m^2). General:Pleasant affect, NAD Skin:Warm and dry, brisk capillary refill. + bruising rt forearm  HEENT:normocephalic, sclera clear, mucus membranes moist Neck:supple, no JVD, no bruits  Heart:S1S2 RRR without murmur, gallup, rub or click Lungs:clear without rales, rhonchi, or wheezes GMW:NUUVAbd:soft, non tender, + BS, do not palpate liver spleen or masses Ext:no lower ext edema, 2+ pedal pulses, 2+ radial pulses Neuro:alert and oriented X 3, MAE, follows commands, + facial symmetry  EKG: none today  Recent Labs: 10/22/2014: ALT 80* 10/23/2014: BUN 7; Creatinine, Ser 1.12; Hemoglobin 14.0; Platelets 192; Potassium 3.8; Sodium 136    Lipid Panel No results found for: CHOL, TRIG, HDL, CHOLHDL, VLDL, LDLCALC, LDLDIRECT     Other studies Reviewed: Additional studies/ records that were reviewed today include: Labs from primary care in April 2016. CBC normal. Glucose 133. Other chemistries normal Cholesterol 128, triglycerides 60, HDL 37, LDL 79   ASSESSMENT AND PLAN:  1.  CAD s/p NSTEMI in January. S/p stents of LCx and RCA.Marland Kitchen.  Continue Ace,  and statin as well as Brilinta and aspirin. He may stop taking Brilinta after Jan 26,2017.   2. Hyperlipidemia well controlled   3.  ED   4. DM type 2.   5. History of tobacco abuse- now stopped.    Continue lifestyle modifications with efforts at resuming exercise and weight loss. I will follow up in one year.

## 2015-09-07 DIAGNOSIS — Z23 Encounter for immunization: Secondary | ICD-10-CM | POA: Diagnosis not present

## 2015-12-02 DIAGNOSIS — I10 Essential (primary) hypertension: Secondary | ICD-10-CM | POA: Diagnosis not present

## 2015-12-02 DIAGNOSIS — Z6841 Body Mass Index (BMI) 40.0 and over, adult: Secondary | ICD-10-CM | POA: Diagnosis not present

## 2015-12-02 DIAGNOSIS — Z0001 Encounter for general adult medical examination with abnormal findings: Secondary | ICD-10-CM | POA: Diagnosis not present

## 2015-12-02 DIAGNOSIS — Z1389 Encounter for screening for other disorder: Secondary | ICD-10-CM | POA: Diagnosis not present

## 2015-12-08 DIAGNOSIS — E1129 Type 2 diabetes mellitus with other diabetic kidney complication: Secondary | ICD-10-CM | POA: Diagnosis not present

## 2015-12-08 DIAGNOSIS — Z6841 Body Mass Index (BMI) 40.0 and over, adult: Secondary | ICD-10-CM | POA: Diagnosis not present

## 2015-12-08 DIAGNOSIS — Z1389 Encounter for screening for other disorder: Secondary | ICD-10-CM | POA: Diagnosis not present

## 2015-12-08 DIAGNOSIS — I10 Essential (primary) hypertension: Secondary | ICD-10-CM | POA: Diagnosis not present

## 2015-12-08 DIAGNOSIS — G894 Chronic pain syndrome: Secondary | ICD-10-CM | POA: Diagnosis not present

## 2015-12-08 DIAGNOSIS — E119 Type 2 diabetes mellitus without complications: Secondary | ICD-10-CM | POA: Diagnosis not present

## 2015-12-16 DIAGNOSIS — Z1211 Encounter for screening for malignant neoplasm of colon: Secondary | ICD-10-CM | POA: Diagnosis not present

## 2016-04-12 DIAGNOSIS — I2510 Atherosclerotic heart disease of native coronary artery without angina pectoris: Secondary | ICD-10-CM | POA: Diagnosis not present

## 2016-04-12 DIAGNOSIS — I10 Essential (primary) hypertension: Secondary | ICD-10-CM | POA: Diagnosis not present

## 2016-04-12 DIAGNOSIS — E782 Mixed hyperlipidemia: Secondary | ICD-10-CM | POA: Diagnosis not present

## 2016-04-12 DIAGNOSIS — Z6841 Body Mass Index (BMI) 40.0 and over, adult: Secondary | ICD-10-CM | POA: Diagnosis not present

## 2016-04-12 DIAGNOSIS — E1165 Type 2 diabetes mellitus with hyperglycemia: Secondary | ICD-10-CM | POA: Diagnosis not present

## 2016-04-12 DIAGNOSIS — Z125 Encounter for screening for malignant neoplasm of prostate: Secondary | ICD-10-CM | POA: Diagnosis not present

## 2016-07-27 DIAGNOSIS — K219 Gastro-esophageal reflux disease without esophagitis: Secondary | ICD-10-CM | POA: Diagnosis not present

## 2016-07-27 DIAGNOSIS — G894 Chronic pain syndrome: Secondary | ICD-10-CM | POA: Diagnosis not present

## 2016-07-27 DIAGNOSIS — Z23 Encounter for immunization: Secondary | ICD-10-CM | POA: Diagnosis not present

## 2016-07-27 DIAGNOSIS — Z6841 Body Mass Index (BMI) 40.0 and over, adult: Secondary | ICD-10-CM | POA: Diagnosis not present

## 2016-07-27 DIAGNOSIS — N400 Enlarged prostate without lower urinary tract symptoms: Secondary | ICD-10-CM | POA: Diagnosis not present

## 2016-09-04 IMAGING — CR DG CHEST 1V
1 series · 1 of 1 positions shown · non-contrast
Comparison: 09/23/2014

CLINICAL DATA: Sharp medial chest pain since 8788 hr tonight.
Shortness of breath.

EXAM:
CHEST - 1 VIEW

[ap]
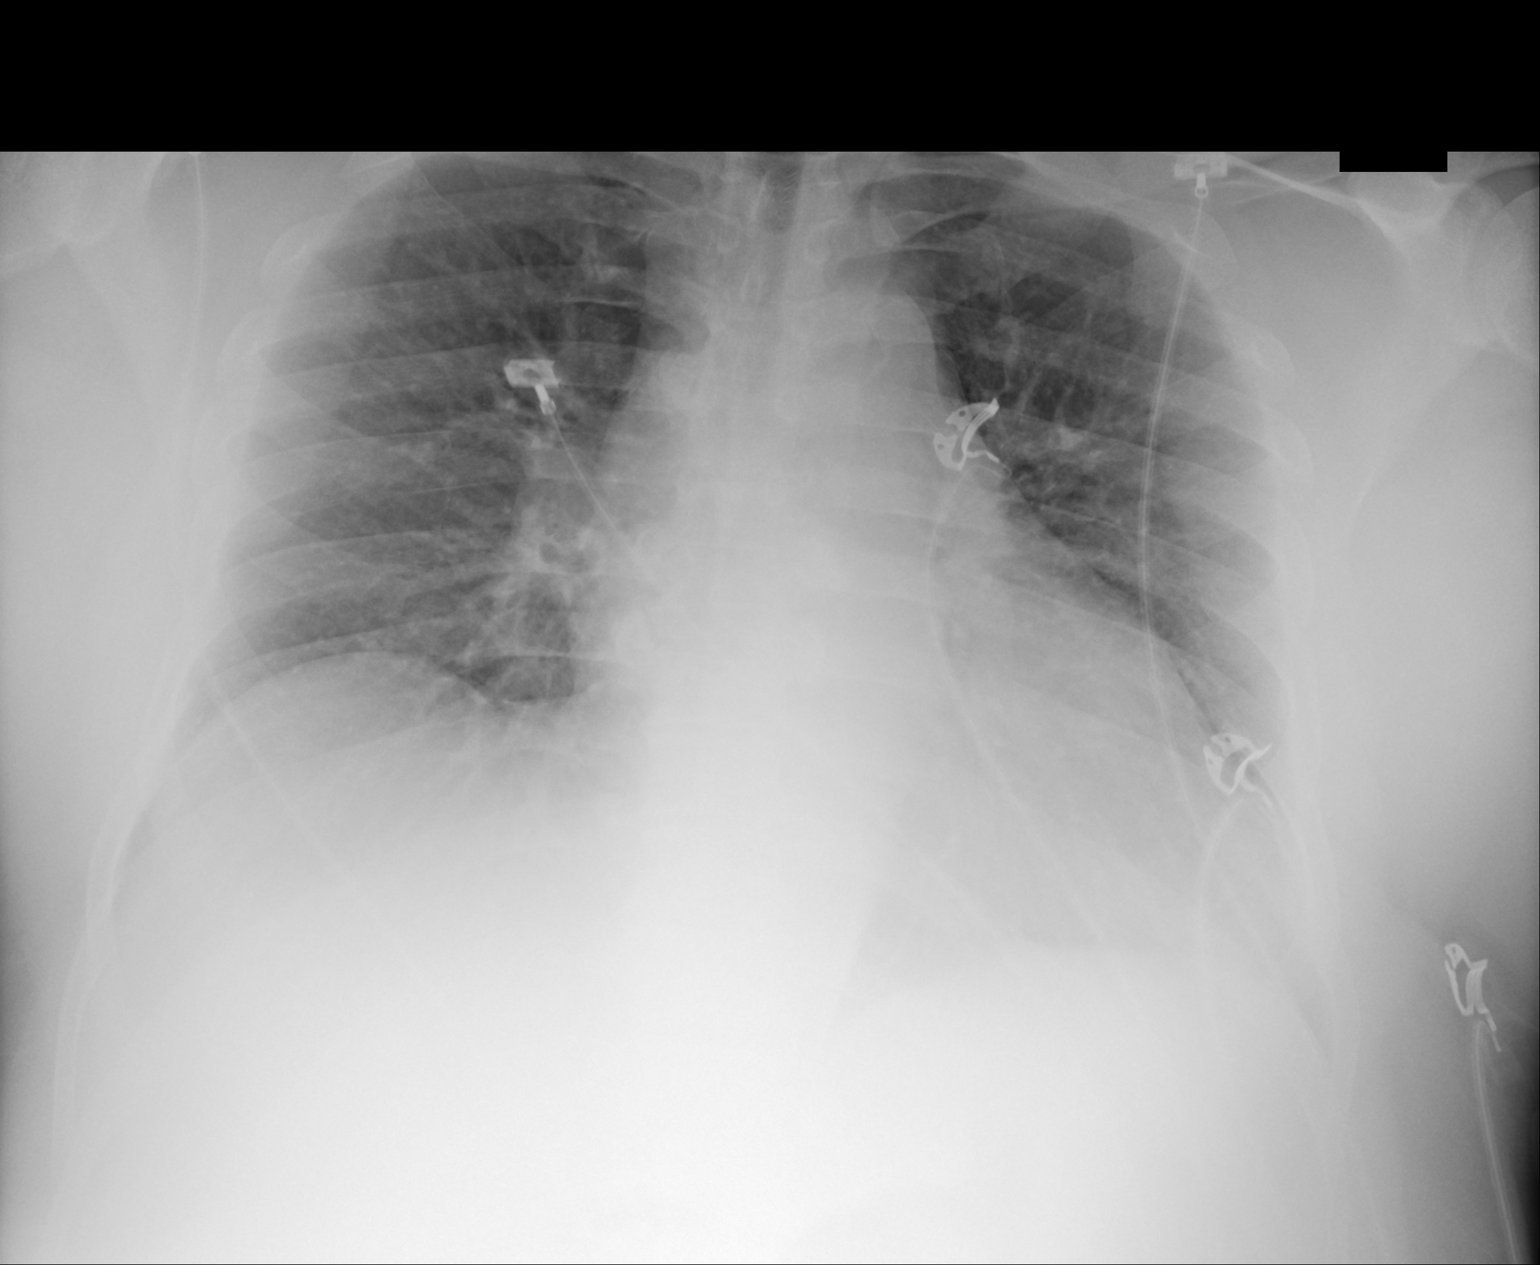

[1 of 1 positions shown; findings below may reference images not displayed]

FINDINGS: Shallow inspiration with elevation of the right hemidiaphragm. The
heart size and mediastinal contours are within normal limits. Both
lungs are clear. The visualized skeletal structures are
unremarkable.
IMPRESSION: No active disease.

## 2017-02-14 DIAGNOSIS — F419 Anxiety disorder, unspecified: Secondary | ICD-10-CM | POA: Diagnosis not present

## 2017-02-14 DIAGNOSIS — E782 Mixed hyperlipidemia: Secondary | ICD-10-CM | POA: Diagnosis not present

## 2017-02-14 DIAGNOSIS — Z1389 Encounter for screening for other disorder: Secondary | ICD-10-CM | POA: Diagnosis not present

## 2017-02-14 DIAGNOSIS — Z6841 Body Mass Index (BMI) 40.0 and over, adult: Secondary | ICD-10-CM | POA: Diagnosis not present

## 2017-02-14 DIAGNOSIS — I10 Essential (primary) hypertension: Secondary | ICD-10-CM | POA: Diagnosis not present

## 2017-02-14 DIAGNOSIS — E118 Type 2 diabetes mellitus with unspecified complications: Secondary | ICD-10-CM | POA: Diagnosis not present

## 2017-02-14 DIAGNOSIS — K219 Gastro-esophageal reflux disease without esophagitis: Secondary | ICD-10-CM | POA: Diagnosis not present

## 2017-02-14 DIAGNOSIS — G894 Chronic pain syndrome: Secondary | ICD-10-CM | POA: Diagnosis not present

## 2017-03-23 DIAGNOSIS — E782 Mixed hyperlipidemia: Secondary | ICD-10-CM | POA: Diagnosis not present

## 2017-03-23 DIAGNOSIS — G894 Chronic pain syndrome: Secondary | ICD-10-CM | POA: Diagnosis not present

## 2017-03-23 DIAGNOSIS — K219 Gastro-esophageal reflux disease without esophagitis: Secondary | ICD-10-CM | POA: Diagnosis not present

## 2017-03-23 DIAGNOSIS — Z1211 Encounter for screening for malignant neoplasm of colon: Secondary | ICD-10-CM | POA: Diagnosis not present

## 2017-03-23 DIAGNOSIS — E119 Type 2 diabetes mellitus without complications: Secondary | ICD-10-CM | POA: Diagnosis not present

## 2017-03-23 DIAGNOSIS — Z0000 Encounter for general adult medical examination without abnormal findings: Secondary | ICD-10-CM | POA: Diagnosis not present

## 2017-03-23 DIAGNOSIS — Z6841 Body Mass Index (BMI) 40.0 and over, adult: Secondary | ICD-10-CM | POA: Diagnosis not present

## 2017-07-04 DIAGNOSIS — Z7689 Persons encountering health services in other specified circumstances: Secondary | ICD-10-CM | POA: Diagnosis not present

## 2017-07-04 DIAGNOSIS — Z6837 Body mass index (BMI) 37.0-37.9, adult: Secondary | ICD-10-CM | POA: Diagnosis not present

## 2017-07-04 DIAGNOSIS — Z1389 Encounter for screening for other disorder: Secondary | ICD-10-CM | POA: Diagnosis not present

## 2017-07-04 DIAGNOSIS — Z23 Encounter for immunization: Secondary | ICD-10-CM | POA: Diagnosis not present

## 2017-07-04 DIAGNOSIS — G894 Chronic pain syndrome: Secondary | ICD-10-CM | POA: Diagnosis not present

## 2017-08-29 DIAGNOSIS — E1165 Type 2 diabetes mellitus with hyperglycemia: Secondary | ICD-10-CM | POA: Diagnosis not present

## 2017-08-29 DIAGNOSIS — I10 Essential (primary) hypertension: Secondary | ICD-10-CM | POA: Diagnosis not present

## 2017-08-29 DIAGNOSIS — G894 Chronic pain syndrome: Secondary | ICD-10-CM | POA: Diagnosis not present

## 2017-08-29 DIAGNOSIS — E291 Testicular hypofunction: Secondary | ICD-10-CM | POA: Diagnosis not present

## 2017-08-29 DIAGNOSIS — I2510 Atherosclerotic heart disease of native coronary artery without angina pectoris: Secondary | ICD-10-CM | POA: Diagnosis not present

## 2017-08-29 DIAGNOSIS — E119 Type 2 diabetes mellitus without complications: Secondary | ICD-10-CM | POA: Diagnosis not present

## 2017-08-29 DIAGNOSIS — Z1389 Encounter for screening for other disorder: Secondary | ICD-10-CM | POA: Diagnosis not present

## 2017-08-29 DIAGNOSIS — Z6837 Body mass index (BMI) 37.0-37.9, adult: Secondary | ICD-10-CM | POA: Diagnosis not present

## 2017-08-29 DIAGNOSIS — N401 Enlarged prostate with lower urinary tract symptoms: Secondary | ICD-10-CM | POA: Diagnosis not present

## 2017-12-27 ENCOUNTER — Ambulatory Visit (HOSPITAL_COMMUNITY)
Admission: RE | Admit: 2017-12-27 | Discharge: 2017-12-27 | Disposition: A | Discharge status: Home / Self Care | Payer: Medicare Other | Source: Ambulatory Visit | Attending: Physician Assistant | Admitting: Physician Assistant

## 2017-12-27 ENCOUNTER — Other Ambulatory Visit (HOSPITAL_COMMUNITY): Payer: Self-pay | Admitting: Physician Assistant

## 2017-12-27 DIAGNOSIS — E1129 Type 2 diabetes mellitus with other diabetic kidney complication: Secondary | ICD-10-CM | POA: Diagnosis not present

## 2017-12-27 DIAGNOSIS — Z6838 Body mass index (BMI) 38.0-38.9, adult: Secondary | ICD-10-CM | POA: Diagnosis not present

## 2017-12-27 DIAGNOSIS — M25552 Pain in left hip: Secondary | ICD-10-CM

## 2017-12-27 DIAGNOSIS — Z1389 Encounter for screening for other disorder: Secondary | ICD-10-CM | POA: Diagnosis not present

## 2017-12-27 DIAGNOSIS — M25562 Pain in left knee: Secondary | ICD-10-CM | POA: Diagnosis not present

## 2017-12-27 DIAGNOSIS — M1712 Unilateral primary osteoarthritis, left knee: Secondary | ICD-10-CM | POA: Diagnosis not present

## 2017-12-27 DIAGNOSIS — M1612 Unilateral primary osteoarthritis, left hip: Secondary | ICD-10-CM | POA: Diagnosis not present

## 2017-12-27 DIAGNOSIS — N529 Male erectile dysfunction, unspecified: Secondary | ICD-10-CM | POA: Diagnosis not present

## 2017-12-27 DIAGNOSIS — G894 Chronic pain syndrome: Secondary | ICD-10-CM | POA: Diagnosis not present

## 2017-12-28 DIAGNOSIS — Z6838 Body mass index (BMI) 38.0-38.9, adult: Secondary | ICD-10-CM | POA: Diagnosis not present

## 2017-12-28 DIAGNOSIS — M1712 Unilateral primary osteoarthritis, left knee: Secondary | ICD-10-CM | POA: Diagnosis not present

## 2018-01-31 DIAGNOSIS — E118 Type 2 diabetes mellitus with unspecified complications: Secondary | ICD-10-CM | POA: Diagnosis not present

## 2018-04-24 DIAGNOSIS — Z6837 Body mass index (BMI) 37.0-37.9, adult: Secondary | ICD-10-CM | POA: Diagnosis not present

## 2018-04-24 DIAGNOSIS — Z1389 Encounter for screening for other disorder: Secondary | ICD-10-CM | POA: Diagnosis not present

## 2018-04-24 DIAGNOSIS — M1612 Unilateral primary osteoarthritis, left hip: Secondary | ICD-10-CM | POA: Diagnosis not present

## 2018-04-24 DIAGNOSIS — G894 Chronic pain syndrome: Secondary | ICD-10-CM | POA: Diagnosis not present

## 2018-07-31 DIAGNOSIS — Z0000 Encounter for general adult medical examination without abnormal findings: Secondary | ICD-10-CM | POA: Diagnosis not present

## 2018-07-31 DIAGNOSIS — Z6837 Body mass index (BMI) 37.0-37.9, adult: Secondary | ICD-10-CM | POA: Diagnosis not present

## 2018-07-31 DIAGNOSIS — E1165 Type 2 diabetes mellitus with hyperglycemia: Secondary | ICD-10-CM | POA: Diagnosis not present

## 2018-07-31 DIAGNOSIS — G894 Chronic pain syndrome: Secondary | ICD-10-CM | POA: Diagnosis not present

## 2018-07-31 DIAGNOSIS — Z23 Encounter for immunization: Secondary | ICD-10-CM | POA: Diagnosis not present

## 2018-07-31 DIAGNOSIS — Z1389 Encounter for screening for other disorder: Secondary | ICD-10-CM | POA: Diagnosis not present

## 2018-10-02 DIAGNOSIS — Z1389 Encounter for screening for other disorder: Secondary | ICD-10-CM | POA: Diagnosis not present

## 2018-10-02 DIAGNOSIS — G894 Chronic pain syndrome: Secondary | ICD-10-CM | POA: Diagnosis not present

## 2018-10-02 DIAGNOSIS — Z6838 Body mass index (BMI) 38.0-38.9, adult: Secondary | ICD-10-CM | POA: Diagnosis not present

## 2018-11-23 DIAGNOSIS — M1712 Unilateral primary osteoarthritis, left knee: Secondary | ICD-10-CM | POA: Diagnosis not present

## 2018-11-23 DIAGNOSIS — G894 Chronic pain syndrome: Secondary | ICD-10-CM | POA: Diagnosis not present

## 2018-11-23 DIAGNOSIS — Z6837 Body mass index (BMI) 37.0-37.9, adult: Secondary | ICD-10-CM | POA: Diagnosis not present

## 2018-11-23 DIAGNOSIS — M1612 Unilateral primary osteoarthritis, left hip: Secondary | ICD-10-CM | POA: Diagnosis not present

## 2018-12-13 ENCOUNTER — Encounter: Payer: Self-pay | Admitting: Orthopedic Surgery

## 2018-12-13 ENCOUNTER — Ambulatory Visit (INDEPENDENT_AMBULATORY_CARE_PROVIDER_SITE_OTHER): Payer: Medicare HMO

## 2018-12-13 ENCOUNTER — Telehealth: Payer: Self-pay | Admitting: Radiology

## 2018-12-13 ENCOUNTER — Ambulatory Visit: Payer: Medicare HMO | Admitting: Orthopedic Surgery

## 2018-12-13 ENCOUNTER — Other Ambulatory Visit: Payer: Self-pay

## 2018-12-13 VITALS — BP 137/65 | HR 67 | Ht 70.0 in | Wt 258.0 lb

## 2018-12-13 DIAGNOSIS — M1612 Unilateral primary osteoarthritis, left hip: Secondary | ICD-10-CM | POA: Diagnosis not present

## 2018-12-13 DIAGNOSIS — M5410 Radiculopathy, site unspecified: Secondary | ICD-10-CM

## 2018-12-13 NOTE — Telephone Encounter (Signed)
Left hip injection  Patient aware will be greater than one month

## 2018-12-13 NOTE — Progress Notes (Signed)
ESTABLISHED PATIENT NEW PROBLEM OFFICE VISIT  Chief Complaint  Patient presents with  . Hip Pain    left x 8 months no injury   . Knee Pain    left     63 year old [name] presents for evaluation of painful left hip.  He had a right bipolar arthroplasty revised to a total hip 10 years ago first surgery was done by Dr. Hilda Lias revision was done by Dr. Constance Goltz.  He now presents with a painful left hip with pain in the groin and left knee.  He says he has a lot of trouble walking pain radiates from the left groin to the left knee with stiffness difficulty walking although at times he can walk 20 minutes to an hour.  He says he cannot describe the quality of the pain it varies between a 5 and a 10 he  used a cane in the past but not currently   Review of Systems  Respiratory: Negative for shortness of breath.   Cardiovascular: Negative for chest pain.  Musculoskeletal: Negative for back pain.  Neurological: Negative for tingling.     Past Medical History:  Diagnosis Date  . CAD, multiple vessel 10/23/2014  . Chronic back pain   . Chronic hip pain   . Diabetes mellitus   . DM2 (diabetes mellitus, type 2) (HCC) 10/23/2014  . GERD (gastroesophageal reflux disease)   . Hyperlipidemia   . Hyperlipidemia LDL goal <70 10/23/2014  . Hypertension   . Tobacco use, stopped 6 months ago  10/23/2014    Past Surgical History:  Procedure Laterality Date  . LEFT HEART CATHETERIZATION WITH CORONARY ANGIOGRAM N/A 10/22/2014   Procedure: LEFT HEART CATHETERIZATION WITH CORONARY ANGIOGRAM;  Surgeon: Corky Crafts, MD;  Location: [facility] CATH LAB;  Service: Cardiovascular;  Laterality: N/A;  . PERCUTANEOUS CORONARY STENT INTERVENTION (PCI-S)  10/22/2014   Procedure: PERCUTANEOUS CORONARY STENT INTERVENTION (PCI-S);  Surgeon: Corky Crafts, MD;  Location: [facility] CATH LAB;  Service: Cardiovascular;;  x3 to prox RCA , Prox CFX and dist CFX  . TOTAL HIP ARTHROPLASTY Right     Family History  Problem  Relation Age of Onset  . Alzheimer's disease Mother   . Heart disease Father   . Heart disease Sister    Social History   Tobacco Use  . Smoking status: Former Smoker    Types: Cigarettes    Start date: 04/21/2014  . Smokeless tobacco: Never Used  Substance Use Topics  . Alcohol use: Yes    Alcohol/week: 0.0 standard drinks    Comment: drinks beer on weekends  . Drug use: No    No Known Allergies  Current Meds  Medication Sig  . acetaminophen (TYLENOL) 325 MG tablet Take 2 tablets (650 mg total) by mouth every 4 (four) hours as needed for headache or mild pain.  Marland Kitchen aspirin EC 81 MG tablet Take 81 mg by mouth daily.  Marland Kitchen atorvastatin (LIPITOR) 40 MG tablet TK 1 T PO  QD  . HYDROcodone-acetaminophen (NORCO/VICODIN) 5-325 MG tablet TK 1 T PO Q 4 H PRF PAIN  . lisinopril-hydrochlorothiazide (PRINZIDE) 10-12.5 MG per tablet Take 1 tablet by mouth daily.  . metFORMIN (GLUCOPHAGE) 1000 MG tablet TK 1 T PO  BID  . tamsulosin (FLOMAX) 0.4 MG CAPS capsule Take 0.4 mg by mouth daily.    BP 137/65   Pulse 67   Ht 5\' 10"  (1.778 m)   Wt 258 lb (117 kg)   BMI 37.02 kg/m   Physical Exam  Vitals signs and nursing note reviewed.  Constitutional:      Appearance: Normal appearance.  Neurological:     Mental Status: He is alert and oriented to person, place, and time.  Psychiatric:        Mood and Affect: Mood normal.     Right Hip Exam  Right hip exam is normal.   Tenderness  The patient is experiencing no tenderness.   Range of Motion  The patient has normal right hip ROM.  Muscle Strength  The patient has normal right hip strength.  Tests  FABER: negative  Other  Erythema: absent Sensation: normal Pulse: present  Comments:  HIP STABILITY NORMAL    Left Hip Exam   Tenderness  The patient is experiencing no tenderness (Painful range of motion with flexion and internal rotation of the left hip).   Range of Motion  Flexion: normal  Internal rotation: abnormal    Muscle Strength  The patient has normal left hip strength.   Tests  FABER: negative  Other  Erythema: absent Sensation: normal Pulse: present  Comments:  HIP STABILITY NORMAL      There were no leg length discrepancies  He was nontender in his lower back   MEDICAL DECISION SECTION  X-rays were done in our office lumbar spine osteoarthritis in the L4-S1 segment  He has a right total hip x-ray which shows the hip is stable he has a pelvic and left hip x-ray which shows osteoarthritis of the left hip Encounter Diagnoses  Name Primary?  . Radicular pain of right lower extremity   . Radicular pain of left lower extremity   . Primary osteoarthritis of left hip Yes    PLAN:  He opted for injection in the joint versus surgery so we have set him up for an intra-articular injection of the left hip  He will follow-up as needed  No orders of the defined types were placed in this encounter.   Fuller Canada, MD  12/13/2018 3:48 PM

## 2018-12-19 NOTE — Telephone Encounter (Signed)
April 22nd at 11 am [facility] hip injection. I called patient to advise, no answer, line went dead.

## 2019-01-04 NOTE — Telephone Encounter (Signed)
This will be RS for him, Jeani Hawking will contact him after COVID

## 2019-01-17 ENCOUNTER — Ambulatory Visit (HOSPITAL_COMMUNITY): Payer: Self-pay

## 2019-02-03 ENCOUNTER — Emergency Department (HOSPITAL_COMMUNITY): Payer: Medicare HMO

## 2019-02-03 ENCOUNTER — Encounter (HOSPITAL_COMMUNITY): Payer: Self-pay | Admitting: Emergency Medicine

## 2019-02-03 ENCOUNTER — Other Ambulatory Visit: Payer: Self-pay

## 2019-02-03 ENCOUNTER — Emergency Department (HOSPITAL_COMMUNITY)
Admission: EM | Admit: 2019-02-03 | Discharge: 2019-02-03 | Disposition: A | Discharge status: Home / Self Care | Payer: Medicare HMO | Attending: Emergency Medicine | Admitting: Emergency Medicine

## 2019-02-03 DIAGNOSIS — Z23 Encounter for immunization: Secondary | ICD-10-CM | POA: Insufficient documentation

## 2019-02-03 DIAGNOSIS — Z79899 Other long term (current) drug therapy: Secondary | ICD-10-CM | POA: Diagnosis not present

## 2019-02-03 DIAGNOSIS — Y999 Unspecified external cause status: Secondary | ICD-10-CM | POA: Diagnosis not present

## 2019-02-03 DIAGNOSIS — S6991XA Unspecified injury of right wrist, hand and finger(s), initial encounter: Secondary | ICD-10-CM | POA: Diagnosis not present

## 2019-02-03 DIAGNOSIS — Z7984 Long term (current) use of oral hypoglycemic drugs: Secondary | ICD-10-CM | POA: Diagnosis not present

## 2019-02-03 DIAGNOSIS — Y9329 Activity, other involving ice and snow: Secondary | ICD-10-CM | POA: Diagnosis not present

## 2019-02-03 DIAGNOSIS — E119 Type 2 diabetes mellitus without complications: Secondary | ICD-10-CM | POA: Diagnosis not present

## 2019-02-03 DIAGNOSIS — Z955 Presence of coronary angioplasty implant and graft: Secondary | ICD-10-CM | POA: Diagnosis not present

## 2019-02-03 DIAGNOSIS — I2510 Atherosclerotic heart disease of native coronary artery without angina pectoris: Secondary | ICD-10-CM | POA: Insufficient documentation

## 2019-02-03 DIAGNOSIS — Z87891 Personal history of nicotine dependence: Secondary | ICD-10-CM | POA: Diagnosis not present

## 2019-02-03 DIAGNOSIS — S61210A Laceration without foreign body of right index finger without damage to nail, initial encounter: Secondary | ICD-10-CM | POA: Insufficient documentation

## 2019-02-03 DIAGNOSIS — Z7982 Long term (current) use of aspirin: Secondary | ICD-10-CM | POA: Diagnosis not present

## 2019-02-03 DIAGNOSIS — M25562 Pain in left knee: Secondary | ICD-10-CM | POA: Insufficient documentation

## 2019-02-03 DIAGNOSIS — Z96641 Presence of right artificial hip joint: Secondary | ICD-10-CM | POA: Insufficient documentation

## 2019-02-03 DIAGNOSIS — Y92018 Other place in single-family (private) house as the place of occurrence of the external cause: Secondary | ICD-10-CM | POA: Insufficient documentation

## 2019-02-03 DIAGNOSIS — W000XXA Fall on same level due to ice and snow, initial encounter: Secondary | ICD-10-CM | POA: Insufficient documentation

## 2019-02-03 DIAGNOSIS — I10 Essential (primary) hypertension: Secondary | ICD-10-CM | POA: Diagnosis not present

## 2019-02-03 DIAGNOSIS — S61219A Laceration without foreign body of unspecified finger without damage to nail, initial encounter: Secondary | ICD-10-CM | POA: Diagnosis not present

## 2019-02-03 MED ORDER — TETANUS-DIPHTH-ACELL PERTUSSIS 5-2.5-18.5 LF-MCG/0.5 IM SUSP
0.5000 mL | Freq: Once | INTRAMUSCULAR | Status: AC
Start: 2019-02-03 — End: 2019-02-03
  Administered 2019-02-03: 08:00:00 0.5 mL via INTRAMUSCULAR
  Filled 2019-02-03: qty 0.5

## 2019-02-03 MED ORDER — POVIDONE-IODINE 10 % EX SOLN
CUTANEOUS | Status: AC
  Administered 2019-02-03: 1
  Filled 2019-02-03: qty 15

## 2019-02-03 MED ORDER — LIDOCAINE HCL (PF) 1 % IJ SOLN
5.0000 mL | Freq: Once | INTRAMUSCULAR | Status: AC
  Administered 2019-02-03: 5 mL
  Filled 2019-02-03: qty 6

## 2019-02-03 NOTE — Discharge Instructions (Addendum)
Keep the splint in place for 7 days.  Can remove it and do the dressing change in 48 hours.  Then replace the splint.  Suture removal in 10 days.  Return for any signs of infection.

## 2019-02-03 NOTE — ED Provider Notes (Signed)
..  Laceration Repair Date/Time: 02/03/2019 10:59 AM Performed by: Sherene Sires, PA-C Authorized by: Vanetta Mulders, MD   Consent:    Consent obtained:  Verbal   Consent given by:  Patient   Risks discussed:  Infection, pain, retained foreign body, tendon damage, need for additional repair, poor wound healing and nerve damage   Alternatives discussed:  No treatment Universal protocol:    Immediately prior to procedure, a time out was called: yes     Patient identity confirmed:  Verbally with patient Anesthesia (see MAR for exact dosages):    Anesthesia method:  Local infiltration   Local anesthetic:  Lidocaine 1% w/o epi Laceration details:    Location:  Finger   Finger location:  R index finger   Length (cm):  6   Depth (mm):  1 Repair type:    Repair type:  Simple Pre-procedure details:    Preparation:  Patient was prepped and draped in usual sterile fashion and imaging obtained to evaluate for foreign bodies Exploration:    Hemostasis achieved with:  Direct pressure   Wound exploration: wound explored through full range of motion and entire depth of wound probed and visualized     Wound extent: no foreign bodies/material noted, no muscle damage noted, no tendon damage noted, no underlying fracture noted and no vascular damage noted     Contaminated: no   Treatment:    Area cleansed with:  Betadine and saline   Amount of cleaning:  Standard   Irrigation solution:  Sterile saline   Irrigation volume:  500 ml   Irrigation method:  Pressure wash   Visualized foreign bodies/material removed: no   Skin repair:    Repair method:  Sutures   Suture size:  4-0   Suture material:  Prolene   Suture technique:  Simple interrupted   Number of sutures:  15 Approximation:    Approximation:  Close Post-procedure details:    Dressing:  Bulky dressing and splint for protection   Patient tolerance of procedure:  Tolerated well, no immediate complications      Sherene Sires, PA-C 02/03/19 1101    Vanetta Mulders, MD 02/04/19 [phone]

## 2019-02-03 NOTE — ED Notes (Signed)
ED Provider at bedside. 

## 2019-02-03 NOTE — ED Triage Notes (Signed)
Pt from home.  Pt was walking out onto the porch, slipped on ice and his hand slid down the glass door.  Deep laceration to right hand.  Bleeding controlled.

## 2019-02-03 NOTE — ED Provider Notes (Addendum)
[facility] EMERGENCY DEPARTMENT Provider Note   CSN: [id number] Arrival date & time: 02/03/19  4098    History   Chief Complaint Chief Complaint  Patient presents with  . Laceration    HPI [name] is a 63 y.o. male.     Patient slipped on ice on the deck this morning while still holding onto door handle.  Patient was significant laceration to his right index finger.  On the palmar side.  Patient is right-hand dominant.  Patient is not sure if tetanus is up-to-date.  Patient also twisted his left knee but he is not concerned about any significant injury there.  Patient is under treatment for 4 chronic left knee pain.  No loss of consciousness.  Patient did not hit his head.  No complaint of neck pain or back pain.  Patient without any recent fevers no upper respiratory symptoms.  Patient's past medical history is significant for coronary artery disease chronic back pain chronic hip pain diabetes type 2 hyperlipidemia and hypertension.     Past Medical History:  Diagnosis Date  . CAD, multiple vessel 10/23/2014  . Chronic back pain   . Chronic hip pain   . Diabetes mellitus   . DM2 (diabetes mellitus, type 2) (HCC) 10/23/2014  . GERD (gastroesophageal reflux disease)   . Hyperlipidemia   . Hyperlipidemia LDL goal <70 10/23/2014  . Hypertension   . Tobacco use, stopped 6 months ago  10/23/2014    Patient Active Problem List   Diagnosis Date Noted  . CAD, multiple vessel, s/p DES to pLCX and dLCX, and pRCA 10/22/14 10/23/2014  . DM2 (diabetes mellitus, type 2) (HCC) 10/23/2014  . Tobacco use, stopped 6 months ago  10/23/2014  . GERD (gastroesophageal reflux disease) 10/23/2014  . Hyperlipidemia LDL goal <70 10/23/2014  . NSTEMI (non-ST elevated myocardial infarction) (HCC) 10/22/2014    Past Surgical History:  Procedure Laterality Date  . LEFT HEART CATHETERIZATION WITH CORONARY ANGIOGRAM N/A 10/22/2014   Procedure: LEFT HEART CATHETERIZATION WITH CORONARY  ANGIOGRAM;  Surgeon: Corky Crafts, MD;  Location: [facility] CATH LAB;  Service: Cardiovascular;  Laterality: N/A;  . PERCUTANEOUS CORONARY STENT INTERVENTION (PCI-S)  10/22/2014   Procedure: PERCUTANEOUS CORONARY STENT INTERVENTION (PCI-S);  Surgeon: Corky Crafts, MD;  Location: [facility] CATH LAB;  Service: Cardiovascular;;  x3 to prox RCA , Prox CFX and dist CFX  . TOTAL HIP ARTHROPLASTY Right         Home Medications    Prior to Admission medications   Medication Sig Start Date End Date Taking? Authorizing Provider  acetaminophen (TYLENOL) 325 MG tablet Take 2 tablets (650 mg total) by mouth every 4 (four) hours as needed for headache or mild pain. 10/23/14   Leone Brand, NP  aspirin EC 81 MG tablet Take 81 mg by mouth daily.    [provider]  atorvastatin (LIPITOR) 40 MG tablet TK 1 T PO  QD 09/30/18   [provider]  HYDROcodone-acetaminophen (NORCO/VICODIN) 5-325 MG tablet TK 1 T PO Q 4 H PRF PAIN 11/23/18   [provider]  lisinopril-hydrochlorothiazide (PRINZIDE) 10-12.5 MG per tablet Take 1 tablet by mouth daily. 11/12/14   Leone Brand, NP  metFORMIN (GLUCOPHAGE) 1000 MG tablet TK 1 T PO  BID 12/02/18   [provider]  nitroGLYCERIN (NITROSTAT) 0.4 MG SL tablet Place 1 tablet (0.4 mg total) under the tongue every 5 (five) minutes as needed for chest pain. Patient not taking: Reported on 12/13/2018  10/23/14   Leone BrandIngold, Laura R, NP  oxyCODONE-acetaminophen (PERCOCET/ROXICET) 5-325 MG per tablet 1 or 2 tabs PO q6h prn pain Patient not taking: Reported on 12/13/2018 10/13/13   Samuel JesterMcManus, Kathleen, DO  tamsulosin (FLOMAX) 0.4 MG CAPS capsule Take 0.4 mg by mouth daily. 08/22/15   [provider]  ticagrelor (BRILINTA) 90 MG TABS tablet Take 1 tablet (90 mg total) by mouth 2 (two) times daily. Patient not taking: Reported on 12/13/2018 01/28/15   [place], Peter M, MD    Family History Family History  Problem Relation Age of Onset  . Alzheimer's  disease Mother   . Heart disease Father   . Heart disease Sister     Social History Social History   Tobacco Use  . Smoking status: Former Smoker    Types: Cigarettes    Start date: 04/21/2014  . Smokeless tobacco: Never Used  Substance Use Topics  . Alcohol use: Yes    Alcohol/week: 0.0 standard drinks    Comment: drinks beer on weekends  . Drug use: No     Allergies   Patient has no known allergies.   Review of Systems Review of Systems  Constitutional: Negative for chills and fever.  HENT: Negative for congestion, rhinorrhea and sore throat.   Eyes: Negative for visual disturbance.  Respiratory: Negative for cough and shortness of breath.   Cardiovascular: Negative for chest pain and leg swelling.  Gastrointestinal: Negative for abdominal pain, diarrhea, nausea and vomiting.  Genitourinary: Negative for dysuria.  Musculoskeletal: Positive for arthralgias. Negative for back pain and neck pain.  Skin: Positive for wound. Negative for rash.  Neurological: Negative for dizziness, weakness, light-headedness, numbness and headaches.  Hematological: Does not bruise/bleed easily.  Psychiatric/Behavioral: Negative for confusion.     Physical Exam Updated Vital Signs BP (!) 150/84 (BP Location: Left Arm)   Pulse 72   Temp 98.1 F (36.7 C)   Resp 18   Ht 1.778 m (5\' 10" )   Wt 104.3 kg   SpO2 100%   BMI 33.00 kg/m   Physical Exam Vitals signs and nursing note reviewed.  Constitutional:      General: He is not in acute distress.    Appearance: He is well-developed.  HENT:     Head: Normocephalic and atraumatic.  Eyes:     Extraocular Movements: Extraocular movements intact.     Conjunctiva/sclera: Conjunctivae normal.     Pupils: Pupils are equal, round, and reactive to light.  Neck:     Musculoskeletal: Normal range of motion and neck supple.  Cardiovascular:     Rate and Rhythm: Normal rate and regular rhythm.     Heart sounds: No murmur.  Pulmonary:      Effort: Pulmonary effort is normal. No respiratory distress.     Breath sounds: Normal breath sounds. No wheezing.  Abdominal:     Palpations: Abdomen is soft.     Tenderness: There is no abdominal tenderness.  Musculoskeletal:     Comments: Patient with about a 6 cm laceration through subcutaneous tissues of the palmar side of the right index finger starting at the PIP joint crossing over the MP joint and going into the palm of the hand.  Cap refills intact sensations intact good flexion at PIP and DIP and MP joint.  Good extension.  Bleeding controlled.  Otherwise no significant extremity injury.  Patient has some chronic swelling to left knee.  But patient feels that the range of motion on that is at baseline and no  concern for any acute injury.  Skin:    General: Skin is warm and dry.     Capillary Refill: Capillary refill takes less than 2 seconds.  Neurological:     General: No focal deficit present.     Mental Status: He is alert and oriented to person, place, and time.      ED Treatments / Results  Labs (all labs ordered are listed, but only abnormal results are displayed) Labs Reviewed - No data to display  EKG None  Radiology Dg Hand Complete Right  Result Date: 02/03/2019 CLINICAL DATA:  Finger laceration after fall. EXAM: RIGHT HAND - COMPLETE 3+ VIEW COMPARISON:  None. FINDINGS: There is no evidence of fracture or dislocation. There is no evidence of arthropathy or other focal bone abnormality. Soft tissues are unremarkable. IMPRESSION: Negative. Electronically Signed   By: Lupita Raider M.D.   On: 02/03/2019 08:17    Procedures Procedures (including critical care time)  Medications Ordered in ED Medications  povidone-iodine (BETADINE) 10 % external solution (has no administration in time range)  lidocaine (PF) (XYLOCAINE) 1 % injection 5 mL (has no administration in time range)  Tdap (BOOSTRIX) injection 0.5 mL (0.5 mLs Intramuscular Given 02/03/19 3009)      Initial Impression / Assessment and Plan / ED Course  I have reviewed the triage vital signs and the nursing notes.  Pertinent labs & imaging results that were available during my care of the patient were reviewed by me and considered in my medical decision making (see chart for details).        X-rays showed no evidence of any foreign body.  No bony injury.  Patient's right index finger will undergo finger block and then extensive exploration to make sure that there is no evidence of any partial tendon injury.  The laceration is deep.  Tetanus updated.  Laceration repair as per physician assistant.  This would be a shared visit.  Extensive wound exploration without evidence of any tendon involvement.  Patient will be closed up.  Will be splinted.  Sutures probably removed in 10 days.  The splint only needs to be in place maybe for about a week.   Medical screening examination/treatment/procedure(s) were conducted as a shared visit with non-physician practitioner(s) and myself.  I personally evaluated the patient during the encounter.  None   Final Clinical Impressions(s) / ED Diagnoses   Final diagnoses:  Laceration of right index finger without foreign body without damage to nail, initial encounter    ED Discharge Orders    None       Vanetta Mulders, MD 02/03/19 0840    Vanetta Mulders, MD 02/03/19 [phone]

## 2019-02-07 ENCOUNTER — Other Ambulatory Visit: Payer: Self-pay

## 2019-02-07 ENCOUNTER — Ambulatory Visit (HOSPITAL_COMMUNITY)
Admission: RE | Admit: 2019-02-07 | Discharge: 2019-02-07 | Disposition: A | Discharge status: Home / Self Care | Payer: Medicare HMO | Source: Ambulatory Visit | Attending: Orthopedic Surgery | Admitting: Orthopedic Surgery

## 2019-02-07 ENCOUNTER — Encounter (HOSPITAL_COMMUNITY): Payer: Self-pay

## 2019-02-07 DIAGNOSIS — M25551 Pain in right hip: Secondary | ICD-10-CM | POA: Diagnosis not present

## 2019-02-07 DIAGNOSIS — M1612 Unilateral primary osteoarthritis, left hip: Secondary | ICD-10-CM | POA: Diagnosis not present

## 2019-02-07 MED ORDER — POVIDONE-IODINE 10 % EX SOLN
CUTANEOUS | Status: AC
  Filled 2019-02-07: qty 15

## 2019-02-07 MED ORDER — LIDOCAINE HCL (PF) 1 % IJ SOLN
INTRAMUSCULAR | Status: AC
  Administered 2019-02-07: 15:00:00 8 mL via INTRA_ARTICULAR
  Filled 2019-02-07: qty 15

## 2019-02-07 MED ORDER — METHYLPREDNISOLONE ACETATE 40 MG/ML IJ SUSP
INTRAMUSCULAR | Status: AC
  Administered 2019-02-07: 15:00:00 40 mg via INTRA_ARTICULAR
  Filled 2019-02-07: qty 1

## 2019-02-07 MED ORDER — IOPAMIDOL (ISOVUE-300) INJECTION 61%: 50.0000 mL | Freq: Once | INTRAVENOUS | Status: DC | PRN

## 2019-02-07 MED ORDER — IOHEXOL 300 MG/ML  SOLN
50.0000 mL | Freq: Once | INTRAMUSCULAR | Status: AC | PRN
  Administered 2019-02-07: 5 mL via INTRATHECAL

## 2019-02-13 DIAGNOSIS — E1129 Type 2 diabetes mellitus with other diabetic kidney complication: Secondary | ICD-10-CM | POA: Diagnosis not present

## 2019-02-13 DIAGNOSIS — Z0001 Encounter for general adult medical examination with abnormal findings: Secondary | ICD-10-CM | POA: Diagnosis not present

## 2019-02-13 DIAGNOSIS — M1612 Unilateral primary osteoarthritis, left hip: Secondary | ICD-10-CM | POA: Diagnosis not present

## 2019-02-13 DIAGNOSIS — Z1389 Encounter for screening for other disorder: Secondary | ICD-10-CM | POA: Diagnosis not present

## 2019-02-13 DIAGNOSIS — Z6838 Body mass index (BMI) 38.0-38.9, adult: Secondary | ICD-10-CM | POA: Diagnosis not present

## 2019-04-26 DIAGNOSIS — Z6839 Body mass index (BMI) 39.0-39.9, adult: Secondary | ICD-10-CM | POA: Diagnosis not present

## 2019-04-26 DIAGNOSIS — E119 Type 2 diabetes mellitus without complications: Secondary | ICD-10-CM | POA: Diagnosis not present

## 2019-04-26 DIAGNOSIS — G894 Chronic pain syndrome: Secondary | ICD-10-CM | POA: Diagnosis not present

## 2019-05-28 DIAGNOSIS — E782 Mixed hyperlipidemia: Secondary | ICD-10-CM | POA: Diagnosis not present

## 2019-05-28 DIAGNOSIS — E1129 Type 2 diabetes mellitus with other diabetic kidney complication: Secondary | ICD-10-CM | POA: Diagnosis not present

## 2019-05-28 DIAGNOSIS — I10 Essential (primary) hypertension: Secondary | ICD-10-CM | POA: Diagnosis not present

## 2019-06-19 DIAGNOSIS — G894 Chronic pain syndrome: Secondary | ICD-10-CM | POA: Diagnosis not present

## 2019-06-19 DIAGNOSIS — Z6839 Body mass index (BMI) 39.0-39.9, adult: Secondary | ICD-10-CM | POA: Diagnosis not present

## 2019-06-19 DIAGNOSIS — M791 Myalgia, unspecified site: Secondary | ICD-10-CM | POA: Diagnosis not present

## 2019-06-27 DIAGNOSIS — I10 Essential (primary) hypertension: Secondary | ICD-10-CM | POA: Diagnosis not present

## 2019-06-27 DIAGNOSIS — E7849 Other hyperlipidemia: Secondary | ICD-10-CM | POA: Diagnosis not present

## 2019-06-27 DIAGNOSIS — I2510 Atherosclerotic heart disease of native coronary artery without angina pectoris: Secondary | ICD-10-CM | POA: Diagnosis not present

## 2019-06-27 DIAGNOSIS — G894 Chronic pain syndrome: Secondary | ICD-10-CM | POA: Diagnosis not present

## 2019-07-28 DIAGNOSIS — E1165 Type 2 diabetes mellitus with hyperglycemia: Secondary | ICD-10-CM | POA: Diagnosis not present

## 2019-07-28 DIAGNOSIS — E1129 Type 2 diabetes mellitus with other diabetic kidney complication: Secondary | ICD-10-CM | POA: Diagnosis not present

## 2019-07-28 DIAGNOSIS — I10 Essential (primary) hypertension: Secondary | ICD-10-CM | POA: Diagnosis not present

## 2019-07-28 DIAGNOSIS — E7849 Other hyperlipidemia: Secondary | ICD-10-CM | POA: Diagnosis not present

## 2019-07-30 DIAGNOSIS — Z6839 Body mass index (BMI) 39.0-39.9, adult: Secondary | ICD-10-CM | POA: Diagnosis not present

## 2019-07-30 DIAGNOSIS — G894 Chronic pain syndrome: Secondary | ICD-10-CM | POA: Diagnosis not present

## 2019-08-17 ENCOUNTER — Other Ambulatory Visit: Payer: Self-pay

## 2019-08-17 NOTE — Patient Outreach (Addendum)
[facility]) Care Management  08/17/2019  [name] 08/17/1956 [id number]   Referral Date: 08/17/2019 Referral Source: Eye screening program Referral Reason: Engage for disease management   Outreach Attempt: No answer. Unable to leave a message.     Plan: RN CM will attempt patient again in the next 30 days and send letter.     [name], RN, MSN [facility] Care Management Care Management Coordinator Direct Line [phone] Toll Free: [phone]  Fax: [phone]

## 2019-08-27 DIAGNOSIS — E1165 Type 2 diabetes mellitus with hyperglycemia: Secondary | ICD-10-CM | POA: Diagnosis not present

## 2019-08-27 DIAGNOSIS — I2510 Atherosclerotic heart disease of native coronary artery without angina pectoris: Secondary | ICD-10-CM | POA: Diagnosis not present

## 2019-08-27 DIAGNOSIS — M1712 Unilateral primary osteoarthritis, left knee: Secondary | ICD-10-CM | POA: Diagnosis not present

## 2019-08-27 DIAGNOSIS — M1612 Unilateral primary osteoarthritis, left hip: Secondary | ICD-10-CM | POA: Diagnosis not present

## 2019-08-31 ENCOUNTER — Ambulatory Visit: Payer: Medicare HMO

## 2019-09-06 ENCOUNTER — Other Ambulatory Visit: Payer: Self-pay

## 2019-09-06 NOTE — Patient Outreach (Signed)
[facility]) Care Management  09/06/2019  [name] Jul 12, 1956 [id number]   Referral Date: 08/17/2019 Referral Source: Eye screening program Referral Reason: Engage for disease management   Outreach Attempt: No answer. Unable to leave a message.     Plan: RN CM will attempt patient again in the month of January.  [name], RN, MSN [place] [facility] Care Management Coordinator Direct Line [phone] Cell [phone] Toll Free: [phone]  Fax: [phone]

## 2019-09-10 DIAGNOSIS — E119 Type 2 diabetes mellitus without complications: Secondary | ICD-10-CM | POA: Diagnosis not present

## 2019-09-10 DIAGNOSIS — Z6839 Body mass index (BMI) 39.0-39.9, adult: Secondary | ICD-10-CM | POA: Diagnosis not present

## 2019-09-10 DIAGNOSIS — I10 Essential (primary) hypertension: Secondary | ICD-10-CM | POA: Diagnosis not present

## 2019-09-10 DIAGNOSIS — G894 Chronic pain syndrome: Secondary | ICD-10-CM | POA: Diagnosis not present

## 2019-09-10 DIAGNOSIS — N400 Enlarged prostate without lower urinary tract symptoms: Secondary | ICD-10-CM | POA: Diagnosis not present

## 2019-09-18 ENCOUNTER — Ambulatory Visit: Payer: Medicare HMO

## 2019-09-27 DIAGNOSIS — I2510 Atherosclerotic heart disease of native coronary artery without angina pectoris: Secondary | ICD-10-CM | POA: Diagnosis not present

## 2019-09-27 DIAGNOSIS — E1165 Type 2 diabetes mellitus with hyperglycemia: Secondary | ICD-10-CM | POA: Diagnosis not present

## 2019-09-27 DIAGNOSIS — E1129 Type 2 diabetes mellitus with other diabetic kidney complication: Secondary | ICD-10-CM | POA: Diagnosis not present

## 2019-09-27 DIAGNOSIS — G894 Chronic pain syndrome: Secondary | ICD-10-CM | POA: Diagnosis not present

## 2019-10-16 ENCOUNTER — Other Ambulatory Visit: Payer: Self-pay

## 2019-10-16 NOTE — Patient Outreach (Signed)
[facility]) Care Management  10/16/2019  [name] 1955-10-08 [id number]   Telephone call to patient for follow up eye screen.  Male answered stating patient not in. CM contact information given for return call.     Plan: RN CM will contact patient again in the month of March.    Bary Leriche, RN, MSN [facility] Care Management Care Management Coordinator Direct Line [phone] Cell [phone] Toll Free: [phone]  Fax: [phone] '

## 2019-10-18 DIAGNOSIS — G894 Chronic pain syndrome: Secondary | ICD-10-CM | POA: Diagnosis not present

## 2019-10-18 DIAGNOSIS — E663 Overweight: Secondary | ICD-10-CM | POA: Diagnosis not present

## 2019-10-18 DIAGNOSIS — Z6829 Body mass index (BMI) 29.0-29.9, adult: Secondary | ICD-10-CM | POA: Diagnosis not present

## 2019-10-18 DIAGNOSIS — N419 Inflammatory disease of prostate, unspecified: Secondary | ICD-10-CM | POA: Diagnosis not present

## 2019-10-28 DIAGNOSIS — I2510 Atherosclerotic heart disease of native coronary artery without angina pectoris: Secondary | ICD-10-CM | POA: Diagnosis not present

## 2019-10-28 DIAGNOSIS — I10 Essential (primary) hypertension: Secondary | ICD-10-CM | POA: Diagnosis not present

## 2019-10-28 DIAGNOSIS — E1165 Type 2 diabetes mellitus with hyperglycemia: Secondary | ICD-10-CM | POA: Diagnosis not present

## 2019-10-28 DIAGNOSIS — M1712 Unilateral primary osteoarthritis, left knee: Secondary | ICD-10-CM | POA: Diagnosis not present

## 2019-11-22 DIAGNOSIS — M1612 Unilateral primary osteoarthritis, left hip: Secondary | ICD-10-CM | POA: Diagnosis not present

## 2019-11-22 DIAGNOSIS — M1712 Unilateral primary osteoarthritis, left knee: Secondary | ICD-10-CM | POA: Diagnosis not present

## 2019-11-22 DIAGNOSIS — G894 Chronic pain syndrome: Secondary | ICD-10-CM | POA: Diagnosis not present

## 2019-11-22 DIAGNOSIS — Z6841 Body Mass Index (BMI) 40.0 and over, adult: Secondary | ICD-10-CM | POA: Diagnosis not present

## 2019-11-25 DIAGNOSIS — E1165 Type 2 diabetes mellitus with hyperglycemia: Secondary | ICD-10-CM | POA: Diagnosis not present

## 2019-11-25 DIAGNOSIS — I2510 Atherosclerotic heart disease of native coronary artery without angina pectoris: Secondary | ICD-10-CM | POA: Diagnosis not present

## 2019-11-25 DIAGNOSIS — I10 Essential (primary) hypertension: Secondary | ICD-10-CM | POA: Diagnosis not present

## 2019-11-25 DIAGNOSIS — M1712 Unilateral primary osteoarthritis, left knee: Secondary | ICD-10-CM | POA: Diagnosis not present

## 2019-12-11 ENCOUNTER — Other Ambulatory Visit: Payer: Self-pay

## 2019-12-11 NOTE — Patient Outreach (Signed)
[facility]) Care Management  12/11/2019  [name] Sep 25, 1956 [id number]   Telephone call to patient for follow up eye screen.  No answer.  Unable to leave a voice message.  Plan: RN CM will wait return call. If no return call will close case.  Bary Leriche, RN, MSN [facility] Care Management Care Management Coordinator Direct Line [phone] Cell [phone] Toll Free: [phone]  Fax: [phone]

## 2019-12-20 DIAGNOSIS — Z6838 Body mass index (BMI) 38.0-38.9, adult: Secondary | ICD-10-CM | POA: Diagnosis not present

## 2019-12-20 DIAGNOSIS — I10 Essential (primary) hypertension: Secondary | ICD-10-CM | POA: Diagnosis not present

## 2019-12-20 DIAGNOSIS — G894 Chronic pain syndrome: Secondary | ICD-10-CM | POA: Diagnosis not present

## 2019-12-20 DIAGNOSIS — M1612 Unilateral primary osteoarthritis, left hip: Secondary | ICD-10-CM | POA: Diagnosis not present

## 2019-12-26 ENCOUNTER — Other Ambulatory Visit: Payer: Self-pay

## 2019-12-26 DIAGNOSIS — I2510 Atherosclerotic heart disease of native coronary artery without angina pectoris: Secondary | ICD-10-CM | POA: Diagnosis not present

## 2019-12-26 DIAGNOSIS — I10 Essential (primary) hypertension: Secondary | ICD-10-CM | POA: Diagnosis not present

## 2019-12-26 DIAGNOSIS — E1165 Type 2 diabetes mellitus with hyperglycemia: Secondary | ICD-10-CM | POA: Diagnosis not present

## 2019-12-26 DIAGNOSIS — M1712 Unilateral primary osteoarthritis, left knee: Secondary | ICD-10-CM | POA: Diagnosis not present

## 2019-12-26 NOTE — Patient Outreach (Signed)
[facility]) Care Management  12/26/2019  AYHAM WORD [birth year] [id number]   Multiple attempts to establish contact with patient without success. No response from letter mailed to patient.   Plan: RN CM will close case at this time.   Bary Leriche, RN, MSN [facility] Care Management Care Management Coordinator Direct Line [phone] Cell [phone] Toll Free: [phone]  Fax: [phone]

## 2020-01-21 ENCOUNTER — Ambulatory Visit: Payer: Medicare HMO | Admitting: Orthopedic Surgery

## 2020-01-24 DIAGNOSIS — G894 Chronic pain syndrome: Secondary | ICD-10-CM | POA: Diagnosis not present

## 2020-01-24 DIAGNOSIS — Z6837 Body mass index (BMI) 37.0-37.9, adult: Secondary | ICD-10-CM | POA: Diagnosis not present

## 2020-01-25 DIAGNOSIS — E1165 Type 2 diabetes mellitus with hyperglycemia: Secondary | ICD-10-CM | POA: Diagnosis not present

## 2020-01-25 DIAGNOSIS — I2510 Atherosclerotic heart disease of native coronary artery without angina pectoris: Secondary | ICD-10-CM | POA: Diagnosis not present

## 2020-01-25 DIAGNOSIS — I10 Essential (primary) hypertension: Secondary | ICD-10-CM | POA: Diagnosis not present

## 2020-01-25 DIAGNOSIS — M1710 Unilateral primary osteoarthritis, unspecified knee: Secondary | ICD-10-CM | POA: Diagnosis not present

## 2020-02-25 DIAGNOSIS — I10 Essential (primary) hypertension: Secondary | ICD-10-CM | POA: Diagnosis not present

## 2020-02-25 DIAGNOSIS — I2510 Atherosclerotic heart disease of native coronary artery without angina pectoris: Secondary | ICD-10-CM | POA: Diagnosis not present

## 2020-02-25 DIAGNOSIS — E1165 Type 2 diabetes mellitus with hyperglycemia: Secondary | ICD-10-CM | POA: Diagnosis not present

## 2020-02-25 DIAGNOSIS — M1712 Unilateral primary osteoarthritis, left knee: Secondary | ICD-10-CM | POA: Diagnosis not present

## 2020-03-11 DIAGNOSIS — G894 Chronic pain syndrome: Secondary | ICD-10-CM | POA: Diagnosis not present

## 2020-03-11 DIAGNOSIS — Z6837 Body mass index (BMI) 37.0-37.9, adult: Secondary | ICD-10-CM | POA: Diagnosis not present

## 2020-03-11 DIAGNOSIS — Z1389 Encounter for screening for other disorder: Secondary | ICD-10-CM | POA: Diagnosis not present

## 2020-03-11 DIAGNOSIS — E119 Type 2 diabetes mellitus without complications: Secondary | ICD-10-CM | POA: Diagnosis not present

## 2020-03-11 DIAGNOSIS — E1165 Type 2 diabetes mellitus with hyperglycemia: Secondary | ICD-10-CM | POA: Diagnosis not present

## 2020-03-11 DIAGNOSIS — Z0001 Encounter for general adult medical examination with abnormal findings: Secondary | ICD-10-CM | POA: Diagnosis not present

## 2020-04-10 DIAGNOSIS — M1612 Unilateral primary osteoarthritis, left hip: Secondary | ICD-10-CM | POA: Diagnosis not present

## 2020-04-10 DIAGNOSIS — Z6837 Body mass index (BMI) 37.0-37.9, adult: Secondary | ICD-10-CM | POA: Diagnosis not present

## 2020-04-10 DIAGNOSIS — G894 Chronic pain syndrome: Secondary | ICD-10-CM | POA: Diagnosis not present

## 2020-04-25 DIAGNOSIS — I2510 Atherosclerotic heart disease of native coronary artery without angina pectoris: Secondary | ICD-10-CM | POA: Diagnosis not present

## 2020-04-25 DIAGNOSIS — E1165 Type 2 diabetes mellitus with hyperglycemia: Secondary | ICD-10-CM | POA: Diagnosis not present

## 2020-04-25 DIAGNOSIS — I10 Essential (primary) hypertension: Secondary | ICD-10-CM | POA: Diagnosis not present

## 2020-04-25 DIAGNOSIS — M1712 Unilateral primary osteoarthritis, left knee: Secondary | ICD-10-CM | POA: Diagnosis not present

## 2020-05-16 DIAGNOSIS — Z125 Encounter for screening for malignant neoplasm of prostate: Secondary | ICD-10-CM | POA: Diagnosis not present

## 2020-05-16 DIAGNOSIS — G894 Chronic pain syndrome: Secondary | ICD-10-CM | POA: Diagnosis not present

## 2020-05-16 DIAGNOSIS — E7849 Other hyperlipidemia: Secondary | ICD-10-CM | POA: Diagnosis not present

## 2020-05-16 DIAGNOSIS — Z6838 Body mass index (BMI) 38.0-38.9, adult: Secondary | ICD-10-CM | POA: Diagnosis not present

## 2020-05-16 DIAGNOSIS — I10 Essential (primary) hypertension: Secondary | ICD-10-CM | POA: Diagnosis not present

## 2020-05-27 DIAGNOSIS — I10 Essential (primary) hypertension: Secondary | ICD-10-CM | POA: Diagnosis not present

## 2020-05-27 DIAGNOSIS — I2510 Atherosclerotic heart disease of native coronary artery without angina pectoris: Secondary | ICD-10-CM | POA: Diagnosis not present

## 2020-05-27 DIAGNOSIS — E1165 Type 2 diabetes mellitus with hyperglycemia: Secondary | ICD-10-CM | POA: Diagnosis not present

## 2020-05-27 DIAGNOSIS — M1712 Unilateral primary osteoarthritis, left knee: Secondary | ICD-10-CM | POA: Diagnosis not present

## 2020-06-19 DIAGNOSIS — M1712 Unilateral primary osteoarthritis, left knee: Secondary | ICD-10-CM | POA: Diagnosis not present

## 2020-06-19 DIAGNOSIS — I2510 Atherosclerotic heart disease of native coronary artery without angina pectoris: Secondary | ICD-10-CM | POA: Diagnosis not present

## 2020-06-19 DIAGNOSIS — I10 Essential (primary) hypertension: Secondary | ICD-10-CM | POA: Diagnosis not present

## 2020-06-19 DIAGNOSIS — E1129 Type 2 diabetes mellitus with other diabetic kidney complication: Secondary | ICD-10-CM | POA: Diagnosis not present

## 2020-06-19 DIAGNOSIS — R0683 Snoring: Secondary | ICD-10-CM | POA: Diagnosis not present

## 2020-06-19 DIAGNOSIS — Z6838 Body mass index (BMI) 38.0-38.9, adult: Secondary | ICD-10-CM | POA: Diagnosis not present

## 2020-06-19 DIAGNOSIS — Z23 Encounter for immunization: Secondary | ICD-10-CM | POA: Diagnosis not present

## 2020-07-23 DIAGNOSIS — I10 Essential (primary) hypertension: Secondary | ICD-10-CM | POA: Diagnosis not present

## 2020-07-23 DIAGNOSIS — Z6839 Body mass index (BMI) 39.0-39.9, adult: Secondary | ICD-10-CM | POA: Diagnosis not present

## 2020-07-23 DIAGNOSIS — G894 Chronic pain syndrome: Secondary | ICD-10-CM | POA: Diagnosis not present

## 2020-07-23 DIAGNOSIS — E1129 Type 2 diabetes mellitus with other diabetic kidney complication: Secondary | ICD-10-CM | POA: Diagnosis not present

## 2020-08-26 DIAGNOSIS — Z6839 Body mass index (BMI) 39.0-39.9, adult: Secondary | ICD-10-CM | POA: Diagnosis not present

## 2020-08-26 DIAGNOSIS — G894 Chronic pain syndrome: Secondary | ICD-10-CM | POA: Diagnosis not present

## 2020-08-26 DIAGNOSIS — I2510 Atherosclerotic heart disease of native coronary artery without angina pectoris: Secondary | ICD-10-CM | POA: Diagnosis not present

## 2020-08-26 DIAGNOSIS — M1612 Unilateral primary osteoarthritis, left hip: Secondary | ICD-10-CM | POA: Diagnosis not present

## 2020-08-26 DIAGNOSIS — E1165 Type 2 diabetes mellitus with hyperglycemia: Secondary | ICD-10-CM | POA: Diagnosis not present

## 2020-08-26 DIAGNOSIS — I10 Essential (primary) hypertension: Secondary | ICD-10-CM | POA: Diagnosis not present

## 2020-08-26 DIAGNOSIS — M1712 Unilateral primary osteoarthritis, left knee: Secondary | ICD-10-CM | POA: Diagnosis not present

## 2020-09-26 DIAGNOSIS — E1165 Type 2 diabetes mellitus with hyperglycemia: Secondary | ICD-10-CM | POA: Diagnosis not present

## 2020-09-26 DIAGNOSIS — I10 Essential (primary) hypertension: Secondary | ICD-10-CM | POA: Diagnosis not present

## 2020-09-26 DIAGNOSIS — I2510 Atherosclerotic heart disease of native coronary artery without angina pectoris: Secondary | ICD-10-CM | POA: Diagnosis not present

## 2020-09-26 DIAGNOSIS — M1712 Unilateral primary osteoarthritis, left knee: Secondary | ICD-10-CM | POA: Diagnosis not present

## 2020-10-01 DIAGNOSIS — E1129 Type 2 diabetes mellitus with other diabetic kidney complication: Secondary | ICD-10-CM | POA: Diagnosis not present

## 2020-10-01 DIAGNOSIS — R632 Polyphagia: Secondary | ICD-10-CM | POA: Diagnosis not present

## 2020-10-01 DIAGNOSIS — Z1389 Encounter for screening for other disorder: Secondary | ICD-10-CM | POA: Diagnosis not present

## 2020-10-01 DIAGNOSIS — I10 Essential (primary) hypertension: Secondary | ICD-10-CM | POA: Diagnosis not present

## 2020-10-01 DIAGNOSIS — G894 Chronic pain syndrome: Secondary | ICD-10-CM | POA: Diagnosis not present

## 2020-10-01 DIAGNOSIS — Z1331 Encounter for screening for depression: Secondary | ICD-10-CM | POA: Diagnosis not present

## 2020-10-01 DIAGNOSIS — M1612 Unilateral primary osteoarthritis, left hip: Secondary | ICD-10-CM | POA: Diagnosis not present

## 2020-10-01 DIAGNOSIS — Z6841 Body Mass Index (BMI) 40.0 and over, adult: Secondary | ICD-10-CM | POA: Diagnosis not present

## 2020-10-31 DIAGNOSIS — G894 Chronic pain syndrome: Secondary | ICD-10-CM | POA: Diagnosis not present

## 2020-10-31 DIAGNOSIS — E1129 Type 2 diabetes mellitus with other diabetic kidney complication: Secondary | ICD-10-CM | POA: Diagnosis not present

## 2020-10-31 DIAGNOSIS — I10 Essential (primary) hypertension: Secondary | ICD-10-CM | POA: Diagnosis not present

## 2020-10-31 DIAGNOSIS — Z6841 Body Mass Index (BMI) 40.0 and over, adult: Secondary | ICD-10-CM | POA: Diagnosis not present

## 2020-11-24 DIAGNOSIS — M1712 Unilateral primary osteoarthritis, left knee: Secondary | ICD-10-CM | POA: Diagnosis not present

## 2020-11-24 DIAGNOSIS — E1165 Type 2 diabetes mellitus with hyperglycemia: Secondary | ICD-10-CM | POA: Diagnosis not present

## 2020-11-24 DIAGNOSIS — I2510 Atherosclerotic heart disease of native coronary artery without angina pectoris: Secondary | ICD-10-CM | POA: Diagnosis not present

## 2020-11-24 DIAGNOSIS — E7849 Other hyperlipidemia: Secondary | ICD-10-CM | POA: Diagnosis not present

## 2020-11-24 DIAGNOSIS — I10 Essential (primary) hypertension: Secondary | ICD-10-CM | POA: Diagnosis not present

## 2020-11-27 DIAGNOSIS — G894 Chronic pain syndrome: Secondary | ICD-10-CM | POA: Diagnosis not present

## 2020-11-27 DIAGNOSIS — M1712 Unilateral primary osteoarthritis, left knee: Secondary | ICD-10-CM | POA: Diagnosis not present

## 2020-11-27 DIAGNOSIS — Z6839 Body mass index (BMI) 39.0-39.9, adult: Secondary | ICD-10-CM | POA: Diagnosis not present

## 2020-11-27 DIAGNOSIS — M1612 Unilateral primary osteoarthritis, left hip: Secondary | ICD-10-CM | POA: Diagnosis not present

## 2020-12-25 DIAGNOSIS — M1712 Unilateral primary osteoarthritis, left knee: Secondary | ICD-10-CM | POA: Diagnosis not present

## 2020-12-25 DIAGNOSIS — G894 Chronic pain syndrome: Secondary | ICD-10-CM | POA: Diagnosis not present

## 2020-12-25 DIAGNOSIS — M1612 Unilateral primary osteoarthritis, left hip: Secondary | ICD-10-CM | POA: Diagnosis not present

## 2021-01-24 DIAGNOSIS — I2510 Atherosclerotic heart disease of native coronary artery without angina pectoris: Secondary | ICD-10-CM | POA: Diagnosis not present

## 2021-01-24 DIAGNOSIS — E1165 Type 2 diabetes mellitus with hyperglycemia: Secondary | ICD-10-CM | POA: Diagnosis not present

## 2021-01-24 DIAGNOSIS — M1712 Unilateral primary osteoarthritis, left knee: Secondary | ICD-10-CM | POA: Diagnosis not present

## 2021-01-24 DIAGNOSIS — I10 Essential (primary) hypertension: Secondary | ICD-10-CM | POA: Diagnosis not present

## 2021-01-28 DIAGNOSIS — M1612 Unilateral primary osteoarthritis, left hip: Secondary | ICD-10-CM | POA: Diagnosis not present

## 2021-01-28 DIAGNOSIS — M1712 Unilateral primary osteoarthritis, left knee: Secondary | ICD-10-CM | POA: Diagnosis not present

## 2021-01-28 DIAGNOSIS — G894 Chronic pain syndrome: Secondary | ICD-10-CM | POA: Diagnosis not present

## 2021-02-24 DIAGNOSIS — M1712 Unilateral primary osteoarthritis, left knee: Secondary | ICD-10-CM | POA: Diagnosis not present

## 2021-02-24 DIAGNOSIS — G894 Chronic pain syndrome: Secondary | ICD-10-CM | POA: Diagnosis not present

## 2021-02-24 DIAGNOSIS — N529 Male erectile dysfunction, unspecified: Secondary | ICD-10-CM | POA: Diagnosis not present

## 2021-02-24 DIAGNOSIS — Z6839 Body mass index (BMI) 39.0-39.9, adult: Secondary | ICD-10-CM | POA: Diagnosis not present

## 2021-02-24 DIAGNOSIS — M1612 Unilateral primary osteoarthritis, left hip: Secondary | ICD-10-CM | POA: Diagnosis not present

## 2021-02-27 DIAGNOSIS — E119 Type 2 diabetes mellitus without complications: Secondary | ICD-10-CM | POA: Diagnosis not present

## 2021-03-05 DIAGNOSIS — Z6839 Body mass index (BMI) 39.0-39.9, adult: Secondary | ICD-10-CM | POA: Diagnosis not present

## 2021-03-05 DIAGNOSIS — N400 Enlarged prostate without lower urinary tract symptoms: Secondary | ICD-10-CM | POA: Diagnosis not present

## 2021-03-05 DIAGNOSIS — R32 Unspecified urinary incontinence: Secondary | ICD-10-CM | POA: Diagnosis not present

## 2021-03-24 DIAGNOSIS — G894 Chronic pain syndrome: Secondary | ICD-10-CM | POA: Diagnosis not present

## 2021-03-26 DIAGNOSIS — I2510 Atherosclerotic heart disease of native coronary artery without angina pectoris: Secondary | ICD-10-CM | POA: Diagnosis not present

## 2021-03-26 DIAGNOSIS — I10 Essential (primary) hypertension: Secondary | ICD-10-CM | POA: Diagnosis not present

## 2021-03-26 DIAGNOSIS — E1165 Type 2 diabetes mellitus with hyperglycemia: Secondary | ICD-10-CM | POA: Diagnosis not present

## 2021-03-26 DIAGNOSIS — E7849 Other hyperlipidemia: Secondary | ICD-10-CM | POA: Diagnosis not present

## 2021-04-01 DIAGNOSIS — E119 Type 2 diabetes mellitus without complications: Secondary | ICD-10-CM | POA: Diagnosis not present

## 2021-04-01 DIAGNOSIS — Z6839 Body mass index (BMI) 39.0-39.9, adult: Secondary | ICD-10-CM | POA: Diagnosis not present

## 2021-04-01 DIAGNOSIS — Z1331 Encounter for screening for depression: Secondary | ICD-10-CM | POA: Diagnosis not present

## 2021-04-01 DIAGNOSIS — R32 Unspecified urinary incontinence: Secondary | ICD-10-CM | POA: Diagnosis not present

## 2021-04-01 DIAGNOSIS — Z0001 Encounter for general adult medical examination with abnormal findings: Secondary | ICD-10-CM | POA: Diagnosis not present

## 2021-04-24 DIAGNOSIS — Z6838 Body mass index (BMI) 38.0-38.9, adult: Secondary | ICD-10-CM | POA: Diagnosis not present

## 2021-04-24 DIAGNOSIS — I10 Essential (primary) hypertension: Secondary | ICD-10-CM | POA: Diagnosis not present

## 2021-04-24 DIAGNOSIS — N400 Enlarged prostate without lower urinary tract symptoms: Secondary | ICD-10-CM | POA: Diagnosis not present

## 2021-04-24 DIAGNOSIS — M7062 Trochanteric bursitis, left hip: Secondary | ICD-10-CM | POA: Diagnosis not present

## 2021-04-24 DIAGNOSIS — E1129 Type 2 diabetes mellitus with other diabetic kidney complication: Secondary | ICD-10-CM | POA: Diagnosis not present

## 2021-04-28 ENCOUNTER — Ambulatory Visit: Payer: Medicare HMO | Admitting: Urology

## 2021-05-25 DIAGNOSIS — G894 Chronic pain syndrome: Secondary | ICD-10-CM | POA: Diagnosis not present

## 2021-05-25 DIAGNOSIS — Z6839 Body mass index (BMI) 39.0-39.9, adult: Secondary | ICD-10-CM | POA: Diagnosis not present

## 2021-05-25 DIAGNOSIS — E669 Obesity, unspecified: Secondary | ICD-10-CM | POA: Diagnosis not present

## 2021-05-25 DIAGNOSIS — E1129 Type 2 diabetes mellitus with other diabetic kidney complication: Secondary | ICD-10-CM | POA: Diagnosis not present

## 2021-05-25 DIAGNOSIS — M5416 Radiculopathy, lumbar region: Secondary | ICD-10-CM | POA: Diagnosis not present

## 2021-05-28 ENCOUNTER — Encounter: Payer: Self-pay | Admitting: Urology

## 2021-05-28 ENCOUNTER — Ambulatory Visit: Payer: Medicare HMO | Admitting: Urology

## 2021-05-28 ENCOUNTER — Other Ambulatory Visit: Payer: Self-pay

## 2021-05-28 VITALS — BP 144/78 | HR 85

## 2021-05-28 DIAGNOSIS — R32 Unspecified urinary incontinence: Secondary | ICD-10-CM | POA: Diagnosis not present

## 2021-05-28 DIAGNOSIS — N401 Enlarged prostate with lower urinary tract symptoms: Secondary | ICD-10-CM

## 2021-05-28 DIAGNOSIS — N3943 Post-void dribbling: Secondary | ICD-10-CM

## 2021-05-28 LAB — URINALYSIS, ROUTINE W REFLEX MICROSCOPIC
Bilirubin, UA: NEGATIVE
Glucose, UA: NEGATIVE
Ketones, UA: NEGATIVE
Leukocytes,UA: NEGATIVE
Nitrite, UA: NEGATIVE
RBC, UA: NEGATIVE
Specific Gravity, UA: >1.03 — ABNORMAL HIGH (ref 1.005–1.030)
Urobilinogen, Ur: 0.2 mg/dL (ref 0.2–1.0)
pH, UA: 6 (ref 5.0–7.5)

## 2021-05-28 LAB — MICROSCOPIC EXAMINATION
Bacteria, UA: NONE SEEN
Epithelial Cells (non renal): NONE SEEN /hpf (ref 0–10)
Renal Epithel, UA: NONE SEEN /hpf
WBC, UA: NONE SEEN /hpf (ref 0–5)

## 2021-05-28 LAB — BLADDER SCAN AMB NON-IMAGING: Scan Result: 92

## 2021-05-28 NOTE — Progress Notes (Signed)
Urological Symptom Review  Patient is experiencing the following symptoms: Frequent urination Hard to postpone urination Get up at night to urinate Leakage of urine Stream starts and stops   Review of Systems  Gastrointestinal (upper)  : Negative for upper GI symptoms  Gastrointestinal (lower) : Negative for lower GI symptoms  Constitutional : Negative for symptoms  Skin: Negative for skin symptoms  Eyes: Negative for eye symptoms  Ear/Nose/Throat : Negative for Ear/Nose/Throat symptoms  Hematologic/Lymphatic: Negative for Hematologic/Lymphatic symptoms  Cardiovascular : Negative for cardiovascular symptoms  Respiratory : Negative for respiratory symptoms  Endocrine: Negative for endocrine symptoms  Musculoskeletal: Joint pain  Neurological: Negative for neurological symptoms  Psychologic: Negative for psychiatric symptoms

## 2021-05-28 NOTE — Progress Notes (Signed)
Subjective: 1. Benign prostatic hyperplasia (BPH) with post-void dribbling      Consult requested by Dr. Assunta Found.    [name] is a 65 yo male who is sent for incontinence.  He has primarily post void dribbling.  He has no stress incontinence.  He can have some minor UUI.  He has frequency q35-45 min which has started recently.  He has nocturia 2-3x.  He has no enuresis.   He has a good stream.  He feels like it empties. He was given tamsulosin but it seemed worse off of the med.  He has a history of ED and has used sildenafil.   He is circumcised.   He has no history of stones, UTI's or GU surgery.    His UA is clear and his PVR is 24ml.  ROS:  ROS  No Known Allergies  Past Medical History:  Diagnosis Date   CAD, multiple vessel 10/23/2014   Chronic back pain    Chronic hip pain    Diabetes mellitus    DM2 (diabetes mellitus, type 2) (HCC) 10/23/2014   GERD (gastroesophageal reflux disease)    Heart attack (HCC)    Hyperlipidemia    Hyperlipidemia LDL goal <70 10/23/2014   Hypertension    Tobacco use, stopped 6 months ago  10/23/2014    Past Surgical History:  Procedure Laterality Date   LEFT HEART CATHETERIZATION WITH CORONARY ANGIOGRAM N/A 10/22/2014   Procedure: LEFT HEART CATHETERIZATION WITH CORONARY ANGIOGRAM;  Surgeon: Corky Crafts, MD;  Location: [facility] CATH LAB;  Service: Cardiovascular;  Laterality: N/A;   PERCUTANEOUS CORONARY STENT INTERVENTION (PCI-S)  10/22/2014   Procedure: PERCUTANEOUS CORONARY STENT INTERVENTION (PCI-S);  Surgeon: Corky Crafts, MD;  Location: [facility] CATH LAB;  Service: Cardiovascular;;  x3 to prox RCA , Prox CFX and dist CFX   TOTAL HIP ARTHROPLASTY Right     Social History   Socioeconomic History   Marital status: Married    Spouse name: Not on file   Number of children: 6   Years of education: Not on file   Highest education level: Not on file  Occupational History   Occupation: disabled  Tobacco Use   Smoking status:  Former    Types: Cigarettes    Start date: 04/21/2014   Smokeless tobacco: Never  Substance and Sexual Activity   Alcohol use: Yes    Alcohol/week: 0.0 standard drinks    Comment: drinks beer on weekends   Drug use: No   Sexual activity: Not on file  Other Topics Concern   Not on file  Social History Narrative   Not on file   Social Determinants of Health   Financial Resource Strain: Not on file  Food Insecurity: Not on file  Transportation Needs: Not on file  Physical Activity: Not on file  Stress: Not on file  Social Connections: Not on file  Intimate Partner Violence: Not on file    Family History  Problem Relation Age of Onset   Alzheimer's disease Mother    Heart disease Father    Heart disease Sister     Anti-infectives: Anti-infectives (From admission, onward)    None       Current Outpatient Medications  Medication Sig Dispense Refill   acetaminophen (TYLENOL) 325 MG tablet Take 2 tablets (650 mg total) by mouth every 4 (four) hours as needed for headache or mild pain.     aspirin EC 81 MG tablet Take 81 mg by mouth daily.  atorvastatin (LIPITOR) 40 MG tablet TK 1 T PO  QD     HYDROcodone-acetaminophen (NORCO/VICODIN) 5-325 MG tablet TK 1 T PO Q 4 H PRF PAIN     lisinopril-hydrochlorothiazide (PRINZIDE) 10-12.5 MG per tablet Take 1 tablet by mouth daily. 30 tablet 12   metFORMIN (GLUCOPHAGE) 1000 MG tablet TK 1 T PO  BID     oxyCODONE-acetaminophen (PERCOCET/ROXICET) 5-325 MG per tablet 1 or 2 tabs PO q6h prn pain 20 tablet 0   No current facility-administered medications for this visit.     Objective: Vital signs in last 24 hours: BP (!) 144/78   Pulse 85   Intake/Output from previous day: No intake/output data recorded. Intake/Output this shift: @IOTHISSHIFT @   Physical Exam Vitals reviewed.  Constitutional:      Appearance: Normal appearance. He is obese.  Cardiovascular:     Rate and Rhythm: Normal rate and regular rhythm.      Heart sounds: Normal heart sounds.  Pulmonary:     Effort: Pulmonary effort is normal.     Breath sounds: Normal breath sounds.  Abdominal:     Palpations: Abdomen is soft. There is no mass.     Tenderness: There is no abdominal tenderness.  Genitourinary:    Comments: Circ phallus with adequate meatus. Scrotum, testes and epididymis normal. AP without lesions, NST without mass. Prostate 1.5+ benign. SV non-palpable.  Musculoskeletal:        General: No swelling. Normal range of motion.  Skin:    General: Skin is warm and dry.  Neurological:     General: No focal deficit present.     Mental Status: He is alert and oriented to person, place, and time.  Psychiatric:        Mood and Affect: Mood normal.        Behavior: Behavior normal.    Lab Results:  Results for orders placed or performed in visit on 05/28/21 (from the past 24 hour(s))  Urinalysis, Routine w reflex microscopic     Status: Abnormal   Collection Time: 05/28/21  1:32 PM  Result Value Ref Range   Specific Gravity, UA >1.030 (H) 1.005 - 1.030   pH, UA 6.0 5.0 - 7.5   Color, UA Yellow Yellow   Appearance Ur Clear Clear   Leukocytes,UA Negative Negative   Protein,UA 2+ (A) Negative/Trace   Glucose, UA Negative Negative   Ketones, UA Negative Negative   RBC, UA Negative Negative   Bilirubin, UA Negative Negative   Urobilinogen, Ur 0.2 0.2 - 1.0 mg/dL   Nitrite, UA Negative Negative   Microscopic Examination See below:    Narrative   Performed at:  [address] - [facility] [address], [place], [place]  [place] Lab Director: [id number], Phone:  [phone]  Microscopic Examination     Status: None   Collection Time: 05/28/21  1:32 PM   Urine  Result Value Ref Range   WBC, UA None seen 0 - 5 /hpf   RBC 0-2 0 - 2 /hpf   Epithelial Cells (non renal) None seen 0 - 10 /hpf   Renal Epithel, UA None seen None seen /hpf   Mucus, UA Present Not Estab.   Bacteria, UA None seen None seen/Few    Narrative   Performed at:  [address] - [facility] [address], [place], [place]  [place] Lab Director: [id number] [place], Phone:  [phone]    BMET No results for input(s): NA, K, CL, CO2, GLUCOSE, BUN, CREATININE,  CALCIUM in the last 72 hours. PT/INR No results for input(s): LABPROT, INR in the last 72 hours. ABG No results for input(s): PHART, HCO3 in the last 72 hours.  Invalid input(s): PCO2, PO2  Studies/Results: No results found. I have reviewed office notes from Dr. Phillips Odor.   UA was negative.   Assessment/Plan: He has significant post void dribbling.   I will have him return for a flowrate and cystoscopy since he has failed alpha blockers.   No orders of the defined types were placed in this encounter.    Orders Placed This Encounter  Procedures   Microscopic Examination   Urinalysis, Routine w reflex microscopic   Bladder Scan (Post Void Residual) in office     Return for Next available flowrate and cystoscopy.  .    CC: Dr. Assunta Found.      Bjorn Pippin 05/28/2021 [phone]

## 2021-06-08 ENCOUNTER — Other Ambulatory Visit: Payer: Self-pay

## 2021-06-08 ENCOUNTER — Ambulatory Visit: Payer: Medicare HMO | Admitting: Orthopedic Surgery

## 2021-06-08 ENCOUNTER — Encounter: Payer: Self-pay | Admitting: Orthopedic Surgery

## 2021-06-08 ENCOUNTER — Ambulatory Visit: Payer: Medicare HMO

## 2021-06-08 VITALS — BP 166/77 | HR 75 | Ht 70.5 in | Wt 266.0 lb

## 2021-06-08 DIAGNOSIS — M1612 Unilateral primary osteoarthritis, left hip: Secondary | ICD-10-CM | POA: Diagnosis not present

## 2021-06-08 MED ORDER — GABAPENTIN 300 MG PO CAPS: 300.0000 mg | ORAL_CAPSULE | Freq: Three times a day (TID) | ORAL | 5 refills | Status: DC

## 2021-06-08 NOTE — Progress Notes (Signed)
Chief Complaint  Patient presents with   Hip Pain    Left / getting worse      65 year old male status post right total hip done in [facility] by Dr. Constance Goltz seen in 2020 for left hip pain and lower back pain presents with worsening left hip pain.  Interestingly he says once he gets on the treadmill his pain goes away and he did get some relief from gabapentin 100 mg 3 times a day from primary care  He says he would like something stronger than the hydrocodone that he is taking  He is diabetic has hypertension  He has coronary artery disease with a recent cath in 2016 stent placement same time   Past Medical History:  Diagnosis Date   CAD, multiple vessel 10/23/2014   Chronic back pain    Chronic hip pain    Diabetes mellitus    DM2 (diabetes mellitus, type 2) (HCC) 10/23/2014   GERD (gastroesophageal reflux disease)    Heart attack (HCC)    Hyperlipidemia    Hyperlipidemia LDL goal <70 10/23/2014   Hypertension    Tobacco use, stopped 6 months ago  10/23/2014    BP (!) 166/77   Pulse 75   Ht 5' 10.5" (1.791 m)   Wt 266 lb (120.7 kg)   BMI 37.63 kg/m   Cardiovascular exam shows normal distal pulses in each lower extremity without edema  He has normal sensation in both legs with normal reflexes straight leg raises are negative  Left hip he has groin pain decreased rotation pain with flexion decreased abduction  X-ray was taken today it shows severe arthritis of his left hip   Encounter Diagnosis  Name Primary?   Primary osteoarthritis of left hip Yes   The patient says he does not want hip replacement he says he cannot afford it although he is on [employer].  He says he cannot afford the 20% but is seeking help from social services  I told him he really needs a hip replacement  We discussed stronger pain medication he says he does not want to take opioids  He did get some relief from the gabapentin so [place] increase that instead  Meds ordered this encounter   Medications   gabapentin (NEURONTIN) 300 MG capsule    Sig: Take 1 capsule (300 mg total) by mouth 3 (three) times daily.    Dispense:  90 capsule    Refill:  5    Chronic pain left hip from arthritis worsening plus prescription management

## 2021-06-25 DIAGNOSIS — G894 Chronic pain syndrome: Secondary | ICD-10-CM | POA: Diagnosis not present

## 2021-06-25 DIAGNOSIS — M1612 Unilateral primary osteoarthritis, left hip: Secondary | ICD-10-CM | POA: Diagnosis not present

## 2021-07-21 DIAGNOSIS — M1712 Unilateral primary osteoarthritis, left knee: Secondary | ICD-10-CM | POA: Diagnosis not present

## 2021-07-21 DIAGNOSIS — Z23 Encounter for immunization: Secondary | ICD-10-CM | POA: Diagnosis not present

## 2021-07-21 DIAGNOSIS — M1612 Unilateral primary osteoarthritis, left hip: Secondary | ICD-10-CM | POA: Diagnosis not present

## 2021-07-21 DIAGNOSIS — G894 Chronic pain syndrome: Secondary | ICD-10-CM | POA: Diagnosis not present

## 2021-07-21 DIAGNOSIS — Z6839 Body mass index (BMI) 39.0-39.9, adult: Secondary | ICD-10-CM | POA: Diagnosis not present

## 2021-08-06 ENCOUNTER — Encounter: Payer: Self-pay | Admitting: Urology

## 2021-08-06 ENCOUNTER — Other Ambulatory Visit: Payer: Self-pay

## 2021-08-06 ENCOUNTER — Ambulatory Visit: Payer: Medicare HMO | Admitting: Urology

## 2021-08-06 VITALS — BP 144/78 | HR 78 | Temp 98.7°F

## 2021-08-06 DIAGNOSIS — R351 Nocturia: Secondary | ICD-10-CM | POA: Diagnosis not present

## 2021-08-06 DIAGNOSIS — N3943 Post-void dribbling: Secondary | ICD-10-CM | POA: Diagnosis not present

## 2021-08-06 DIAGNOSIS — N3941 Urge incontinence: Secondary | ICD-10-CM

## 2021-08-06 DIAGNOSIS — N5201 Erectile dysfunction due to arterial insufficiency: Secondary | ICD-10-CM | POA: Diagnosis not present

## 2021-08-06 DIAGNOSIS — N401 Enlarged prostate with lower urinary tract symptoms: Secondary | ICD-10-CM | POA: Diagnosis not present

## 2021-08-06 LAB — MICROSCOPIC EXAMINATION
Bacteria, UA: NONE SEEN
Epithelial Cells (non renal): NONE SEEN /hpf (ref 0–10)
Renal Epithel, UA: NONE SEEN /hpf
WBC, UA: NONE SEEN /hpf (ref 0–5)

## 2021-08-06 LAB — URINALYSIS, ROUTINE W REFLEX MICROSCOPIC
Bilirubin, UA: NEGATIVE
Glucose, UA: NEGATIVE
Ketones, UA: NEGATIVE
Leukocytes,UA: NEGATIVE
Nitrite, UA: NEGATIVE
Protein,UA: NEGATIVE
Specific Gravity, UA: 1.02 (ref 1.005–1.030)
Urobilinogen, Ur: 0.2 mg/dL (ref 0.2–1.0)
pH, UA: 5.5 (ref 5.0–7.5)

## 2021-08-06 MED ORDER — TADALAFIL 5 MG PO TABS: 5.0000 mg | ORAL_TABLET | Freq: Every day | ORAL | 11 refills | Status: DC | PRN

## 2021-08-06 MED ORDER — CIPROFLOXACIN HCL 500 MG PO TABS: 500.0000 mg | ORAL_TABLET | Freq: Once | ORAL | Status: DC

## 2021-08-06 NOTE — Progress Notes (Signed)
Subjective: 1. Benign prostatic hyperplasia (BPH) with post-void dribbling   2. Nocturia   3. Urge incontinence   4. Erectile dysfunction due to arterial insufficiency       Consult requested by Dr. Assunta Found.    [name] is a 65 yo male who is sent for incontinence.  He has primarily post void dribbling.  He has no stress incontinence.  He can have some minor UUI.  He has frequency q35-45 min which has started recently.  He has nocturia 2-3x.  He has no enuresis.   He has a good stream.  He feels like it empties. He was given tamsulosin but it seemed worse off of the med.  He has a history of ED and has used sildenafil.   He is circumcised.   He has no history of stones, UTI's or GU surgery.    His UA is clear and his PVR is 70ml.   11/10/22[name] returns today in f/u for a flow rate and cystoscopy to better assess his voiding symptoms.  He was concerned about a urine smell reported by his partner that he has discovered was from failure to retract the foreskin.  He does that now and doesn't have the odor.  He still has some frequency q2hr but has some UUI or rare occasion.  He has nocturia x 2-3.  He has ED that is responding to sildenafil.   IPSS     Row Name 08/06/21 1400         International Prostate Symptom Score   How often have you had the sensation of not emptying your bladder? About half the time     How often have you had to urinate less than every two hours? About half the time     How often have you found you stopped and started again several times when you urinated? Less than half the time     How often have you found it difficult to postpone urination? Not at All     How often have you had a weak urinary stream? About half the time     How often have you had to strain to start urination? Not at All     How many times did you typically get up at night to urinate? 2 Times     Total IPSS Score 13       Quality of Life due to urinary symptoms   If you were to spend  the rest of your life with your urinary condition just the way it is now how would you feel about that? Pleased              ROS:  ROS  No Known Allergies  Past Medical History:  Diagnosis Date   CAD, multiple vessel 10/23/2014   Chronic back pain    Chronic hip pain    Diabetes mellitus    DM2 (diabetes mellitus, type 2) (HCC) 10/23/2014   GERD (gastroesophageal reflux disease)    Heart attack (HCC)    Hyperlipidemia    Hyperlipidemia LDL goal <70 10/23/2014   Hypertension    Tobacco use, stopped 6 months ago  10/23/2014    Past Surgical History:  Procedure Laterality Date   LEFT HEART CATHETERIZATION WITH CORONARY ANGIOGRAM N/A 10/22/2014   Procedure: LEFT HEART CATHETERIZATION WITH CORONARY ANGIOGRAM;  Surgeon: Corky Crafts, MD;  Location: [facility] CATH LAB;  Service: Cardiovascular;  Laterality: N/A;   PERCUTANEOUS CORONARY STENT INTERVENTION (PCI-S)  10/22/2014   Procedure:  PERCUTANEOUS CORONARY STENT INTERVENTION (PCI-S);  Surgeon: Corky Crafts, MD;  Location: [facility] CATH LAB;  Service: Cardiovascular;;  x3 to prox RCA , Prox CFX and dist CFX   TOTAL HIP ARTHROPLASTY Right     Social History   Socioeconomic History   Marital status: Married    Spouse name: Not on file   Number of children: 6   Years of education: Not on file   Highest education level: Not on file  Occupational History   Occupation: disabled  Tobacco Use   Smoking status: Former    Types: Cigarettes    Start date: 04/21/2014   Smokeless tobacco: Never  Substance and Sexual Activity   Alcohol use: Yes    Alcohol/week: 0.0 standard drinks    Comment: drinks beer on weekends   Drug use: No   Sexual activity: Not on file  Other Topics Concern   Not on file  Social History Narrative   Not on file   Social Determinants of Health   Financial Resource Strain: Not on file  Food Insecurity: Not on file  Transportation Needs: Not on file  Physical Activity: Not on file  Stress: Not on  file  Social Connections: Not on file  Intimate Partner Violence: Not on file    Family History  Problem Relation Age of Onset   Alzheimer's disease Mother    Heart disease Father    Heart disease Sister     Anti-infectives: Anti-infectives (From admission, onward)    Start     Dose/Rate Route Frequency Ordered Stop   08/06/21 1400  CIPROFLOXACIN HCL 500 MG PO TABS  Status:  Discontinued        500 mg Oral  Once 08/06/21 1353 08/06/21 1416       Current Outpatient Medications  Medication Sig Dispense Refill   acetaminophen (TYLENOL) 325 MG tablet Take 2 tablets (650 mg total) by mouth every 4 (four) hours as needed for headache or mild pain.     aspirin EC 81 MG tablet Take 81 mg by mouth daily.     gabapentin (NEURONTIN) 300 MG capsule Take 1 capsule (300 mg total) by mouth 3 (three) times daily. 90 capsule 5   HYDROcodone-acetaminophen (NORCO/VICODIN) 5-325 MG tablet TK 1 T PO Q 4 H PRF PAIN     lisinopril-hydrochlorothiazide (PRINZIDE) 10-12.5 MG per tablet Take 1 tablet by mouth daily. 30 tablet 12   metFORMIN (GLUCOPHAGE) 1000 MG tablet TK 1 T PO  BID     pravastatin (PRAVACHOL) 10 MG tablet Take 10 mg by mouth. Once a week on Monday     tadalafil (CIALIS) 5 MG tablet Take 1 tablet (5 mg total) by mouth daily as needed for erectile dysfunction. 30 tablet 11   No current facility-administered medications for this visit.     Objective: Vital signs in last 24 hours: BP (!) 144/78   Pulse 78   Temp 98.7 F (37.1 C)   Intake/Output from previous day: No intake/output data recorded. Intake/Output this shift: @IOTHISSHIFT @   Physical Exam  Lab Results:  Results for orders placed or performed in visit on 08/06/21 (from the past 24 hour(s))  Urinalysis, Routine w reflex microscopic     Status: Abnormal   Collection Time: 08/06/21  2:18 PM  Result Value Ref Range   Specific Gravity, UA 1.020 1.005 - 1.030   pH, UA 5.5 5.0 - 7.5   Color, UA Yellow Yellow    Appearance Ur Clear Clear   Leukocytes,UA  Negative Negative   Protein,UA Negative Negative/Trace   Glucose, UA Negative Negative   Ketones, UA Negative Negative   RBC, UA Trace (A) Negative   Bilirubin, UA Negative Negative   Urobilinogen, Ur 0.2 0.2 - 1.0 mg/dL   Nitrite, UA Negative Negative   Microscopic Examination See below:    Narrative   Performed at:  [address] - [facility] [address] [place], [place]  [id number] Lab Director: Chinita Pester [place], Phone:  [phone]  Microscopic Examination     Status: None   Collection Time: 08/06/21  2:18 PM   Urine  Result Value Ref Range   WBC, UA None seen 0 - 5 /hpf   RBC 0-2 0 - 2 /hpf   Epithelial Cells (non renal) None seen 0 - 10 /hpf   Renal Epithel, UA None seen None seen /hpf   Mucus, UA Present Not Estab.   Bacteria, UA None seen None seen/Few   Narrative   Performed at:  [address] - [facility] [address] [place], [place]  [id number] Lab Director: Chinita Pester [place], Phone:  [phone]     BMET No results for input(s): NA, K, CL, CO2, GLUCOSE, BUN, CREATININE, CALCIUM in the last 72 hours. PT/INR No results for input(s): LABPROT, INR in the last 72 hours. ABG No results for input(s): PHART, HCO3 in the last 72 hours.  Invalid input(s): PCO2, PO2  Studies/Results: No results found.   Assessment/Plan: Post-void dribbling.   This has improved with foreskin retraction at the time of voiding.  BPH with BOO and OAB wet.   I am going to try him on daily tadalafil since he needs ED treatment.  Side effects and instructions reviewed.  ED.  Daily tadalafil.   Meds ordered this encounter  Medications   DISCONTD: ciprofloxacin (CIPRO) tablet 500 mg   tadalafil (CIALIS) 5 MG tablet    Sig: Take 1 tablet (5 mg total) by mouth daily as needed for erectile dysfunction.    Dispense:  30 tablet    Refill:  11      Orders Placed This Encounter  Procedures   Microscopic Examination   Urinalysis, Routine  w reflex microscopic      Return in about 3 months (around 11/06/2021).    CC: Dr. Assunta Found.      [name] 08/06/2021 [phone] Patient ID: [name], male   DOB: Jul 12, 1956, 65 y.o.   MRN: [id number]

## 2021-08-06 NOTE — Progress Notes (Signed)
Urological Symptom Review  Patient is experiencing the following symptoms: Frequent urination Get up at night to urinate Leakage of urine   Review of Systems  Gastrointestinal (upper)  : Negative for upper GI symptoms  Gastrointestinal (lower) : Negative for lower GI symptoms  Constitutional : Negative for symptoms  Skin: Negative for skin symptoms  Eyes: Negative for eye symptoms  Ear/Nose/Throat : Negative for Ear/Nose/Throat symptoms  Hematologic/Lymphatic: Negative for Hematologic/Lymphatic symptoms  Cardiovascular : Negative for cardiovascular symptoms  Respiratory : Negative for respiratory symptoms  Endocrine: Negative for endocrine symptoms  Musculoskeletal: Negative for musculoskeletal symptoms  Neurological: Negative for neurological symptoms  Psychologic: Negative for psychiatric symptoms  

## 2021-08-24 DIAGNOSIS — Z6839 Body mass index (BMI) 39.0-39.9, adult: Secondary | ICD-10-CM | POA: Diagnosis not present

## 2021-08-24 DIAGNOSIS — M1712 Unilateral primary osteoarthritis, left knee: Secondary | ICD-10-CM | POA: Diagnosis not present

## 2021-08-24 DIAGNOSIS — G894 Chronic pain syndrome: Secondary | ICD-10-CM | POA: Diagnosis not present

## 2021-08-24 DIAGNOSIS — E1129 Type 2 diabetes mellitus with other diabetic kidney complication: Secondary | ICD-10-CM | POA: Diagnosis not present

## 2021-08-24 DIAGNOSIS — I10 Essential (primary) hypertension: Secondary | ICD-10-CM | POA: Diagnosis not present

## 2021-09-23 DIAGNOSIS — M1712 Unilateral primary osteoarthritis, left knee: Secondary | ICD-10-CM | POA: Diagnosis not present

## 2021-09-23 DIAGNOSIS — G894 Chronic pain syndrome: Secondary | ICD-10-CM | POA: Diagnosis not present

## 2021-09-23 DIAGNOSIS — M1612 Unilateral primary osteoarthritis, left hip: Secondary | ICD-10-CM | POA: Diagnosis not present

## 2021-10-26 DIAGNOSIS — M1612 Unilateral primary osteoarthritis, left hip: Secondary | ICD-10-CM | POA: Diagnosis not present

## 2021-10-26 DIAGNOSIS — N401 Enlarged prostate with lower urinary tract symptoms: Secondary | ICD-10-CM | POA: Diagnosis not present

## 2021-10-26 DIAGNOSIS — M1712 Unilateral primary osteoarthritis, left knee: Secondary | ICD-10-CM | POA: Diagnosis not present

## 2021-10-26 DIAGNOSIS — N138 Other obstructive and reflux uropathy: Secondary | ICD-10-CM | POA: Diagnosis not present

## 2021-10-26 DIAGNOSIS — Z6839 Body mass index (BMI) 39.0-39.9, adult: Secondary | ICD-10-CM | POA: Diagnosis not present

## 2021-10-26 DIAGNOSIS — G894 Chronic pain syndrome: Secondary | ICD-10-CM | POA: Diagnosis not present

## 2021-11-05 ENCOUNTER — Other Ambulatory Visit: Payer: Self-pay

## 2021-11-05 ENCOUNTER — Ambulatory Visit (INDEPENDENT_AMBULATORY_CARE_PROVIDER_SITE_OTHER): Payer: Medicare HMO | Admitting: Urology

## 2021-11-05 VITALS — BP 159/76 | HR 71

## 2021-11-05 DIAGNOSIS — N401 Enlarged prostate with lower urinary tract symptoms: Secondary | ICD-10-CM

## 2021-11-05 DIAGNOSIS — N5201 Erectile dysfunction due to arterial insufficiency: Secondary | ICD-10-CM

## 2021-11-05 DIAGNOSIS — N3943 Post-void dribbling: Secondary | ICD-10-CM | POA: Diagnosis not present

## 2021-11-05 DIAGNOSIS — N3941 Urge incontinence: Secondary | ICD-10-CM

## 2021-11-05 DIAGNOSIS — R351 Nocturia: Secondary | ICD-10-CM | POA: Diagnosis not present

## 2021-11-05 LAB — URINALYSIS, ROUTINE W REFLEX MICROSCOPIC
Bilirubin, UA: NEGATIVE
Glucose, UA: NEGATIVE
Ketones, UA: NEGATIVE
Leukocytes,UA: NEGATIVE
Nitrite, UA: NEGATIVE
Protein,UA: NEGATIVE
Specific Gravity, UA: 1.02 (ref 1.005–1.030)
Urobilinogen, Ur: 0.2 mg/dL (ref 0.2–1.0)
pH, UA: 6.5 (ref 5.0–7.5)

## 2021-11-05 LAB — MICROSCOPIC EXAMINATION
Bacteria, UA: NONE SEEN
Epithelial Cells (non renal): NONE SEEN /hpf (ref 0–10)
RBC, Urine: NONE SEEN /hpf (ref 0–2)
Renal Epithel, UA: NONE SEEN /hpf
WBC, UA: NONE SEEN /hpf (ref 0–5)

## 2021-11-05 NOTE — Progress Notes (Signed)
Subjective: 1. Benign prostatic hyperplasia (BPH) with post-void dribbling   2. Nocturia   3. Urge incontinence   4. Erectile dysfunction due to arterial insufficiency       Consult requested by Dr. Sharilyn Sites.    [name] is a 66 yo male who is sent for incontinence.  He has primarily post void dribbling.  He has no stress incontinence.  He can have some minor UUI.  He has frequency q35-45 min which has started recently.  He has nocturia 2-3x.  He has no enuresis.   He has a good stream.  He feels like it empties. He was given tamsulosin but it seemed worse off of the med.  He has a history of ED and has used sildenafil.   He is circumcised.   He has no history of stones, UTI's or GU surgery.    His UA is clear and his PVR is 65ml.   11/10/22Percival [name] returns today in f/u for a flow rate and cystoscopy to better assess his voiding symptoms.  He was concerned about a urine smell reported by his partner that he has discovered was from failure to retract the foreskin.  He does that now and doesn't have the odor.  He still has some frequency q2hr but has some UUI or rare occasion.  He has nocturia x 2-3.  He has ED that is responding to sildenafil.  2/9/23Percival [name] returns today in f/u.  He was changed to daily tadalafil at his last visit for his BPH and ED.  He is not taking it daily but it works the erections.   His iPSS today is 11 with nocturia x 3,  a reduced stream and a sensation of incomplete empyting.  He is reasonably content with his urinary symptoms.     IPSS     Row Name 11/05/21 1500         International Prostate Symptom Score   How often have you had the sensation of not emptying your bladder? About half the time     How often have you had to urinate less than every two hours? Less than half the time     How often have you found you stopped and started again several times when you urinated? Not at All     How often have you found it difficult to postpone urination? Not at  All     How often have you had a weak urinary stream? About half the time     How often have you had to strain to start urination? Not at All     How many times did you typically get up at night to urinate? 3 Times     Total IPSS Score 11       Quality of Life due to urinary symptoms   If you were to spend the rest of your life with your urinary condition just the way it is now how would you feel about that? Mixed              ROS:  ROS  No Known Allergies  Past Medical History:  Diagnosis Date   CAD, multiple vessel 10/23/2014   Chronic back pain    Chronic hip pain    Diabetes mellitus    DM2 (diabetes mellitus, type 2) ([place]) 10/23/2014   GERD (gastroesophageal reflux disease)    Heart attack ([place])    Hyperlipidemia    Hyperlipidemia LDL goal <70 10/23/2014   Hypertension  Tobacco use, stopped 6 months ago  10/23/2014    Past Surgical History:  Procedure Laterality Date   LEFT HEART CATHETERIZATION WITH CORONARY ANGIOGRAM N/A 10/22/2014   Procedure: LEFT HEART CATHETERIZATION WITH CORONARY ANGIOGRAM;  Surgeon: Phillip Booze, MD;  Location: [facility] CATH LAB;  Service: Cardiovascular;  Laterality: N/A;   PERCUTANEOUS CORONARY STENT INTERVENTION (PCI-S)  10/22/2014   Procedure: PERCUTANEOUS CORONARY STENT INTERVENTION (PCI-S);  Surgeon: Phillip Booze, MD;  Location: [facility] CATH LAB;  Service: Cardiovascular;;  x3 to prox RCA , Prox CFX and dist CFX   TOTAL HIP ARTHROPLASTY Right     Social History   Socioeconomic History   Marital status: Married    Spouse name: Not on file   Number of children: 6   Years of education: Not on file   Highest education level: Not on file  Occupational History   Occupation: disabled  Tobacco Use   Smoking status: Former    Types: Cigarettes    Start date: 04/21/2014   Smokeless tobacco: Never  Substance and Sexual Activity   Alcohol use: Yes    Alcohol/week: 0.0 standard drinks    Comment: drinks beer on weekends   Drug  use: No   Sexual activity: Not on file  Other Topics Concern   Not on file  Social History Narrative   Not on file   Social Determinants of Health   Financial Resource Strain: Not on file  Food Insecurity: Not on file  Transportation Needs: Not on file  Physical Activity: Not on file  Stress: Not on file  Social Connections: Not on file  Intimate Partner Violence: Not on file    Family History  Problem Relation Age of Onset   Alzheimer's disease Mother    Heart disease Father    Heart disease Sister     Anti-infectives: Anti-infectives (From admission, onward)    None       Current Outpatient Medications  Medication Sig Dispense Refill   aspirin EC 81 MG tablet Take 81 mg by mouth daily.     finasteride (PROSCAR) 5 MG tablet Take by mouth.     gabapentin (NEURONTIN) 300 MG capsule Take 1 capsule (300 mg total) by mouth 3 (three) times daily. 90 capsule 5   HYDROcodone-acetaminophen (NORCO/VICODIN) 5-325 MG tablet TK 1 T PO Q 4 H PRF PAIN     lisinopril-hydrochlorothiazide (PRINZIDE) 10-12.5 MG per tablet Take 1 tablet by mouth daily. 30 tablet 12   metFORMIN (GLUCOPHAGE) 1000 MG tablet TK 1 T PO  BID     pravastatin (PRAVACHOL) 10 MG tablet Take 10 mg by mouth. Once a week on Monday     tadalafil (CIALIS) 5 MG tablet Take 1 tablet (5 mg total) by mouth daily as needed for erectile dysfunction. 30 tablet 11   No current facility-administered medications for this visit.     Objective: Vital signs in last 24 hours: BP (!) 159/76    Pulse 71   Intake/Output from previous day: No intake/output data recorded. Intake/Output this shift: @IOTHISSHIFT @   Physical Exam  Lab Results:  Results for orders placed or performed in visit on 11/05/21 (from the past 24 hour(s))  Urinalysis, Routine w reflex microscopic     Status: Abnormal   Collection Time: 11/05/21  4:05 PM  Result Value Ref Range   Specific Gravity, UA 1.020 1.005 - 1.030   pH, UA 6.5 5.0 - 7.5    Color, UA Amber (A) Yellow   Appearance Ur  Clear Clear   Leukocytes,UA Negative Negative   Protein,UA Negative Negative/Trace   Glucose, UA Negative Negative   Ketones, UA Negative Negative   RBC, UA Trace (A) Negative   Bilirubin, UA Negative Negative   Urobilinogen, Ur 0.2 0.2 - 1.0 mg/dL   Nitrite, UA Negative Negative   Microscopic Examination See below:    Narrative   Performed at:  [facility] [address] [place], [place]  [id number] Lab Director: Mina Marble [place], Phone:  [phone]  Microscopic Examination     Status: None   Collection Time: 11/05/21  4:05 PM   Urine  Result Value Ref Range   WBC, UA None seen 0 - 5 /hpf   RBC None seen 0 - 2 /hpf   Epithelial Cells (non renal) None seen 0 - 10 /hpf   Renal Epithel, UA None seen None seen /hpf   Bacteria, UA None seen None seen/Few   Narrative   Performed at:  [facility] [address] [place], [place]  [id number] Lab Director: [place], Phone:  [phone]      BMET No results for input(s): NA, K, CL, CO2, GLUCOSE, BUN, CREATININE, CALCIUM in the last 72 hours. PT/INR No results for input(s): LABPROT, INR in the last 72 hours. ABG No results for input(s): PHART, HCO3 in the last 72 hours.  Invalid input(s): PCO2, PO2  Studies/Results: No results found.   Assessment/Plan: Post-void dribbling.   This remains improved wiith foreskin retraction at the time of voiding.  BPH with BOO and OAB wet.   I encouraged him to take the tadalafil instead of prn to see if it will help the voiding symptoms.   I will see if his PCP has PSA's on him.   ED.  Prn tadalafil has been effective so hopefully the daily will work for both problems.    No orders of the defined types were placed in this encounter.     Orders Placed This Encounter  Procedures   Microscopic Examination   Urinalysis, Routine w reflex microscopic      Return in about 6 months (around 05/05/2022).     CC: Dr. Sharilyn Sites.      Irine Seal 11/06/2021 [id number] Patient ID: Samara Snide, male   DOB: 1956/06/11, 66 y.o.   MRN: [id number]

## 2021-11-06 ENCOUNTER — Encounter: Payer: Self-pay | Admitting: Urology

## 2021-11-11 ENCOUNTER — Other Ambulatory Visit: Payer: Self-pay

## 2021-11-25 DIAGNOSIS — N401 Enlarged prostate with lower urinary tract symptoms: Secondary | ICD-10-CM | POA: Diagnosis not present

## 2021-11-25 DIAGNOSIS — G894 Chronic pain syndrome: Secondary | ICD-10-CM | POA: Diagnosis not present

## 2021-12-24 DIAGNOSIS — G894 Chronic pain syndrome: Secondary | ICD-10-CM | POA: Diagnosis not present

## 2021-12-24 DIAGNOSIS — M1612 Unilateral primary osteoarthritis, left hip: Secondary | ICD-10-CM | POA: Diagnosis not present

## 2021-12-24 DIAGNOSIS — M1712 Unilateral primary osteoarthritis, left knee: Secondary | ICD-10-CM | POA: Diagnosis not present

## 2022-01-22 DIAGNOSIS — I10 Essential (primary) hypertension: Secondary | ICD-10-CM | POA: Diagnosis not present

## 2022-01-22 DIAGNOSIS — E6609 Other obesity due to excess calories: Secondary | ICD-10-CM | POA: Diagnosis not present

## 2022-01-22 DIAGNOSIS — Z6837 Body mass index (BMI) 37.0-37.9, adult: Secondary | ICD-10-CM | POA: Diagnosis not present

## 2022-01-22 DIAGNOSIS — G894 Chronic pain syndrome: Secondary | ICD-10-CM | POA: Diagnosis not present

## 2022-01-22 DIAGNOSIS — M1712 Unilateral primary osteoarthritis, left knee: Secondary | ICD-10-CM | POA: Diagnosis not present

## 2022-01-22 DIAGNOSIS — E1129 Type 2 diabetes mellitus with other diabetic kidney complication: Secondary | ICD-10-CM | POA: Diagnosis not present

## 2022-02-26 DIAGNOSIS — G894 Chronic pain syndrome: Secondary | ICD-10-CM | POA: Diagnosis not present

## 2022-02-26 DIAGNOSIS — N401 Enlarged prostate with lower urinary tract symptoms: Secondary | ICD-10-CM | POA: Diagnosis not present

## 2022-02-26 DIAGNOSIS — M1712 Unilateral primary osteoarthritis, left knee: Secondary | ICD-10-CM | POA: Diagnosis not present

## 2022-03-01 DIAGNOSIS — Z0100 Encounter for examination of eyes and vision without abnormal findings: Secondary | ICD-10-CM | POA: Diagnosis not present

## 2022-03-01 DIAGNOSIS — H52229 Regular astigmatism, unspecified eye: Secondary | ICD-10-CM | POA: Diagnosis not present

## 2022-03-24 DIAGNOSIS — E6609 Other obesity due to excess calories: Secondary | ICD-10-CM | POA: Diagnosis not present

## 2022-03-24 DIAGNOSIS — Z6836 Body mass index (BMI) 36.0-36.9, adult: Secondary | ICD-10-CM | POA: Diagnosis not present

## 2022-03-24 DIAGNOSIS — G894 Chronic pain syndrome: Secondary | ICD-10-CM | POA: Diagnosis not present

## 2022-03-24 DIAGNOSIS — E1165 Type 2 diabetes mellitus with hyperglycemia: Secondary | ICD-10-CM | POA: Diagnosis not present

## 2022-03-24 DIAGNOSIS — N400 Enlarged prostate without lower urinary tract symptoms: Secondary | ICD-10-CM | POA: Diagnosis not present

## 2022-03-24 DIAGNOSIS — I10 Essential (primary) hypertension: Secondary | ICD-10-CM | POA: Diagnosis not present

## 2022-03-31 ENCOUNTER — Encounter: Payer: Self-pay | Admitting: Orthopedic Surgery

## 2022-03-31 ENCOUNTER — Ambulatory Visit (INDEPENDENT_AMBULATORY_CARE_PROVIDER_SITE_OTHER): Payer: Medicare HMO

## 2022-03-31 ENCOUNTER — Ambulatory Visit: Payer: Medicare HMO | Admitting: Orthopedic Surgery

## 2022-03-31 VITALS — BP 162/88 | HR 72 | Ht 70.5 in | Wt 259.0 lb

## 2022-03-31 DIAGNOSIS — M1612 Unilateral primary osteoarthritis, left hip: Secondary | ICD-10-CM

## 2022-03-31 DIAGNOSIS — E119 Type 2 diabetes mellitus without complications: Secondary | ICD-10-CM | POA: Diagnosis not present

## 2022-03-31 NOTE — Progress Notes (Signed)
FOLLOW UP   Encounter Diagnosis  Name Primary?   Primary osteoarthritis of left hip Yes     Chief Complaint  Patient presents with   Hip Pain    Left    BP (!) 162/88   Pulse 72   Ht 5' 10.5" (1.791 m)   Wt 259 lb (117.5 kg)   BMI 36.105 kg/m    66 year old male with longstanding osteoarthritis left hip presented in 2020 and 2022.  He was advised to have surgery but he could not afford the co-pay with his insurance so he put it off.  [facility] increasing pain in his left hip groin and left leg with decreased range of motion and poor function with activities of daily living  He came in today to request left total hip arthroplasty  Review of Systems  Respiratory:  Negative for shortness of breath.   Cardiovascular:  Negative for chest pain.  All other systems reviewed and are negative.  Past Medical History:  Diagnosis Date   CAD, multiple vessel 10/23/2014   Chronic back pain    Chronic hip pain    Diabetes mellitus    DM2 (diabetes mellitus, type 2) ([facility]) 10/23/2014   GERD (gastroesophageal reflux disease)    Heart attack ([facility])    Hyperlipidemia    Hyperlipidemia LDL goal <70 10/23/2014   Hypertension    Tobacco use, stopped 6 months ago  10/23/2014   Past Surgical History:  Procedure Laterality Date   LEFT HEART CATHETERIZATION WITH CORONARY ANGIOGRAM N/A 10/22/2014   Procedure: LEFT HEART CATHETERIZATION WITH CORONARY ANGIOGRAM;  Surgeon: Corky Crafts, MD;  Location[facility] CATH LAB;  Service: Cardiovascular;  Laterality: N/A;   PERCUTANEOUS CORONARY STENT INTERVENTION (PCI-S)  10/22/2014   Procedure: PERCUTANEOUS CORONARY STENT INTERVENTION (PCI-S);  Surgeon: Corky Crafts, MD;  Location: [facility] CATH LAB;  Service: Cardiovascular;;  x3 to prox RCA , Prox CFX and dist CFX   TOTAL HIP ARTHROPLASTY Right    Family History  Problem Relation Age of Onset   Alzheimer's disease Mother    Heart disease Father    Heart disease Sister      BP (!) 162/88   Pulse 72   Ht 5' 10.5" (1.791 m)   Wt 259 lb (117.5 kg)   BMI 36.64 kg/m   Mental status is normal.  He is alert and oriented x3.  Body habitus is mesomorphic.  His leg and thigh and hip area have minimal if any adipose tissue  He has limited motion in his left hip including limited flexion and limited internal rotation both are painful.  He has a slight leg length discrepancy of about 5 mm with the left side being a little shorter  Neurovascular exam is intact.  No evidence of peripheral edema  New images show further deterioration of his left hip with fragmentation of the femoral head narrowing of the joint cyst in the acetabulum  I discussed this with him that he would probably require bone graft screw fixation for the cup and may be an extra day in the hospital.  The procedure has been fully reviewed with the patient; The risks and benefits of surgery have been discussed and explained and understood. Alternative treatment has also been reviewed, questions were encouraged and answered. The postoperative plan is also been reviewed.   Plan is for left total hip

## 2022-03-31 NOTE — Addendum Note (Signed)
Addended byCaffie Damme on: 03/31/2022 10:34 AM   Modules accepted: Orders

## 2022-03-31 NOTE — Patient Instructions (Signed)
Your surgery will be at [facility] by Dr Romeo Apple expect to be in the hospital overnight. The hospital will contact you with a preoperative appointment to discuss Anesthesia. Please bring your medications with you for the appointment. They will tell you the arrival time and medication instructions when you have your preoperative evaluation. Do not wear nail polish the day of your surgery and if you take Phentermine you need to stop this medication ONE WEEK prior to your surgery.    You will have home physical therapy for 2 weeks after surgery, the home health agency will call you before or just following the surgery to set up visits. [facility] is the agency we normally use, unless you request another agency.   You will get a call also from outpatient therapy for therapy starting when the home therapy is done.  If you have questions or need to Reschedule the surgery, call the office ask for Liba Hulsey.

## 2022-04-20 ENCOUNTER — Other Ambulatory Visit: Payer: Self-pay | Admitting: Orthopedic Surgery

## 2022-04-20 DIAGNOSIS — M1612 Unilateral primary osteoarthritis, left hip: Secondary | ICD-10-CM

## 2022-04-20 NOTE — Patient Instructions (Signed)
[name]  04/20/2022     @[email or web address] @   Your procedure is scheduled on  04/27/2022.   Report to [facility] at  0600  A.M.   Call this number if you have problems the morning of surgery:  [phone]   Remember:  Do not eat or drink after midnight.     DO NOT take any medications for diabetes the morning of your procedure.     Take these medicines the morning of surgery with A SIP OF WATER        voltaren (if needed), neurontin, norco (if needed).     Do not wear jewelry, make-up or nail polish.  Do not wear lotions, powders, or perfumes, or deodorant.  Do not shave 48 hours prior to surgery.  Men may shave face and neck.  Do not bring valuables to the hospital.  [facility] is not responsible for any belongings or valuables.  Contacts, dentures or bridgework may not be worn into surgery.  Leave your suitcase in the car.  After surgery it may be brought to your room.  For patients admitted to the hospital, discharge time will be determined by your treatment team.  Patients discharged the day of surgery will not be allowed to drive home and must have someone with them for 24 hours.    Special instructions:   DO NOT smoke tobacco or vape for 24 hours before your procedure.  Please read over the following fact sheets that you were given. Pain Booklet, Coughing and Deep Breathing, Blood Transfusion Information, Total Joint Packet, Surgical Site Infection Prevention, Anesthesia Post-op Instructions, and Care and Recovery After Surgery      Total Hip Replacement, Posterior, Care After This sheet gives you information about how to care for yourself after your procedure. Your doctor may also give you more specific instructions. If you have problems or questions, contact your doctor. What can I expect after the procedure? Pain. Redness and swelling. Stiffness. Discomfort. A small amount of blood coming from your cut from surgery  (incision). A small amount of clear fluid coming from your incision. Follow these instructions at home: Medicines Take over-the-counter and prescription medicines only as told by your doctor. Take a medicine to thin your blood (anticoagulant) as told, if your doctor prescribed one. If told, take steps to prevent problems with pooping (constipation). You may need to: Drink enough fluid to keep your pee (urine) pale yellow. Take medicines. You will be told what medicines to take. Eat foods that are high in fiber. These include beans, whole grains, and fresh fruits and vegetables. Limit foods that are high in fat and sugar. These include fried or sweet foods. Ask your doctor if you should avoid driving or using machines while you are taking your medicine. Incision care  Follow instructions from your doctor about how to take care of your incision. Make sure you: Wash your hands with soap and water for at least 20 seconds before and after you change your bandage (dressing). If you cannot use soap and water, use hand sanitizer. Change your bandage as told by your doctor. Leave stitches (sutures), staples, skin glue, or skin tape (adhesive) strips in place. They may need to stay in place for 2 weeks or longer. If tape strips get loose and curl up, you may trim the loose edges. Do not take off tape strips completely unless your doctor tells you to do that. Do not take  baths, swim, or use a hot tub until your doctor says it is okay. Check your incision every day for signs of infection. Check for: More redness, swelling, or pain. More fluid or blood. Warmth. Pus or a bad smell. Managing pain, stiffness, and swelling  If told, put ice on the hip area. To do this: Put ice in a plastic bag. Place a towel between your skin and the bag. Leave the ice on for 20 minutes, 2-3 times a day. Take off the ice if your skin turns bright red. This is very important. If you cannot feel pain, heat, or cold, you  have a greater risk of damage to the area. Move your toes often. Raise your leg above the level of your heart while you are lying down. Activity Ask your doctor what activities are safe for you. Get up to take short walks every 1 to 2 hours. Ask for help if you feel weak or unsteady. Do exercises as told by your doctor. Do not use your legs to support (bear) your body weight until your doctor says that you can. Follow instructions about how much weight you may safely support on your injured leg (weight-bearing restrictions). Use a walker, crutches, or a cane as told by your doctor. Return to your normal activities when your doctor says that it is safe. Movement restrictions  Follow instructions from your doctor about how much you should let your hip move (movement restrictions). For example: Do not cross your legs at the knees. Try keeping a pillow between your legs when you lie in bed. Do not bend farther than 90 degrees at your hip and waist. To keep from bending this far: Do not bring your knees higher than your hips. Do not pick up something from the floor while sitting in a chair. Avoid sitting in low chairs. Use a raised toilet seat. When you start to stand up after sitting, keep your injured leg out in front of you. Avoid twisting at your waist and reaching across your body to the side of your injured leg. Avoid turning your toes inward. When getting into a car: Raise the seat as high as you can. Move the seat as far back as it will go. Recline the upper part of the seat slightly. Sit down onto the seat with your injured leg sticking out of the car. Scoot back in the seat as you move the lower half of your body into the car. Try to avoid bumping your foot or leg as you bring it into the car. Safety Keep floors clear of things you may trip over. Place items that you may need within easy reach. Wear an apron or tool belt with pockets for carrying things. General instructions Ask  your doctor when it is safe to drive. Wear compression stockings as told by your doctor. Keep doing breathing exercises as told. Do not smoke or use any products that contain nicotine or tobacco. If you need help quitting, ask your doctor. If you plan to visit a dentist: Tell your doctor. Ask about things to do before your teeth are cleaned. Tell your dentist about your new joint. Keep all follow-up visits. Contact a doctor if: You have a fever or chills. You have a cough. Your medicine does not help your pain. You have any of these signs of infection in your incision: More redness, swelling, or pain. More fluid or blood. Warmth. Pus or a bad smell. Get help right away if: You have very bad  pain. You feel short of breath. You have trouble breathing. You have chest pain. You have redness, swelling, pain, or warmth in your leg or in the back of your lower leg (calf). Your incision opens up after stitches or staples are taken out. These symptoms may be an emergency. Get help right away. Call your local emergency services (911 in the [place] Do not wait to see if the symptoms will go away. Do not drive yourself to the hospital. Summary Do not use your legs to support your body weight until your doctor says that you can. Use a walker, crutches, or a cane as told. Follow instructions from your doctor about how much you can let your hip move. Keep all follow-up visits. This information is not intended to replace advice given to you by your health care provider. Make sure you discuss any questions you have with your health care provider. Document Revised: 02/07/2020 Document Reviewed: 02/07/2020 Elsevier Patient Education  2023 [employer] General Anesthesia, Adult, Care After This sheet gives you information about how to care for yourself after your procedure. Your health care provider may also give you more specific instructions. If you have problems or questions, contact your health  care provider. What can I expect after the procedure? After the procedure, the following side effects are common: Pain or discomfort at the IV site. Nausea. Vomiting. Sore throat. Trouble concentrating. Feeling cold or chills. Feeling weak or tired. Sleepiness and fatigue. Soreness and body aches. These side effects can affect parts of the body that were not involved in surgery. Follow these instructions at home: For the time period you were told by your health care provider:  Rest. Do not participate in activities where you could fall or become injured. Do not drive or use machinery. Do not drink alcohol. Do not take sleeping pills or medicines that cause drowsiness. Do not make important decisions or sign legal documents. Do not take care of children on your own. Eating and drinking Follow any instructions from your health care provider about eating or drinking restrictions. When you feel hungry, start by eating small amounts of foods that are soft and easy to digest (bland), such as toast. Gradually return to your regular diet. Drink enough fluid to keep your urine pale yellow. If you vomit, rehydrate by drinking water, juice, or clear broth. General instructions If you have sleep apnea, surgery and certain medicines can increase your risk for breathing problems. Follow instructions from your health care provider about wearing your sleep device: Anytime you are sleeping, including during daytime naps. While taking prescription pain medicines, sleeping medicines, or medicines that make you drowsy. Have a responsible adult stay with you for the time you are told. It is important to have someone help care for you until you are awake and alert. Return to your normal activities as told by your health care provider. Ask your health care provider what activities are safe for you. Take over-the-counter and prescription medicines only as told by your health care provider. If you smoke, do  not smoke without supervision. Keep all follow-up visits as told by your health care provider. This is important. Contact a health care provider if: You have nausea or vomiting that does not get better with medicine. You cannot eat or drink without vomiting. You have pain that does not get better with medicine. You are unable to pass urine. You develop a skin rash. You have a fever. You have redness around your IV site that gets  worse. Get help right away if: You have difficulty breathing. You have chest pain. You have blood in your urine or stool, or you vomit blood. Summary After the procedure, it is common to have a sore throat or nausea. It is also common to feel tired. Have a responsible adult stay with you for the time you are told. It is important to have someone help care for you until you are awake and alert. When you feel hungry, start by eating small amounts of foods that are soft and easy to digest (bland), such as toast. Gradually return to your regular diet. Drink enough fluid to keep your urine pale yellow. Return to your normal activities as told by your health care provider. Ask your health care provider what activities are safe for you. This information is not intended to replace advice given to you by your health care provider. Make sure you discuss any questions you have with your health care provider. Document Revised: 05/29/2020 Document Reviewed: 12/27/2019 Elsevier Patient Education  2023 [employer] Spinal Anesthesia and Epidural Anesthesia, Care After This sheet gives you information about how to care for yourself after your procedure. Your doctor may also give you more specific instructions. If you have problems or questions, contact your doctor. What can I expect after the procedure? While the medicines you were given are still having effects, it is common to: Feel like you may vomit (nauseous). Vomit. Have a numb feeling or tingling in your legs. Have  problems when you pee (urinate). Feel itchy. Follow these instructions at home: For the time period you were told by your doctor:  Rest. Do not do activities where you could fall or get hurt. Do not drive or use machines. Do not drink alcohol. Do not take sleeping pills or medicines that make you drowsy. Do not make big decisions or sign legal documents. Do not take care of children on your own. Eating and drinking If you vomit, wait a short time. When you can drink without vomiting, try to drink some water, juice, or clear soup. Drink enough fluid to keep your pee (urine) pale yellow. Make sure you do not feel like vomiting before you eat solid foods. Follow the diet that your doctor recommends. General instructions If you have sleep apnea, surgery and certain medicines can raise your risk for breathing problems. Follow instructions from your doctor about when to wear your sleep device. Have a responsible adult stay with you for the time you are told. It is important to have someone help care for you until you are awake and alert. Return to your normal activities as told by your doctor. Ask your doctor what activities are safe for you. Take over-the-counter and prescription medicines only as told by your doctor. Do not use any products that contain nicotine or tobacco, such as cigarettes, e-cigarettes, and chewing tobacco. If you need help quitting, ask your doctor. Keep all follow-up visits as told by your doctor. This is important. Contact a doctor if: It has been more than one day since your procedure and you feel like you may vomit or you are vomiting. You have a rash. Get help right away if: You have a fever. You have a headache that lasts a long time. You have a very bad headache. Your vision is blurry or you see two of a single object (double vision). You are dizzy or light-headed. You faint. Your arms or legs tingle, feel weak, or get numb. You have trouble breathing. You  cannot pee. These symptoms may be an emergency. Get help right away. Call your local emergency services (911 in the [place] Do not wait to see if the symptoms will go away. Do not drive yourself to the hospital. Summary After the procedure, have a responsible adult stay with you at home. Do not do activities that might cause you to get hurt. Do not drive, use machinery, drink alcohol, or make important decisions as told by your doctor. Do not use products that contain nicotine or tobacco. Get help right away if you have a very bad headache, trouble breathing, or you cannot pee. This information is not intended to replace advice given to you by your health care provider. Make sure you discuss any questions you have with your health care provider. Document Revised: 05/29/2020 Document Reviewed: 11/01/2019 Elsevier Patient Education  2023 [employer] How to Use Chlorhexidine for Bathing Chlorhexidine gluconate (CHG) is a germ-killing (antiseptic) solution that is used to clean the skin. It can get rid of the bacteria that normally live on the skin and can keep them away for about 24 hours. To clean your skin with CHG, you may be given: A CHG solution to use in the shower or as part of a sponge bath. A prepackaged cloth that contains CHG. Cleaning your skin with CHG may help lower the risk for infection: While you are staying in the intensive care unit of the hospital. If you have a vascular access, such as a central line, to provide short-term or long-term access to your veins. If you have a catheter to drain urine from your bladder. If you are on a ventilator. A ventilator is a machine that helps you breathe by moving air in and out of your lungs. After surgery. What are the risks? Risks of using CHG include: A skin reaction. Hearing loss, if CHG gets in your ears and you have a perforated eardrum. Eye injury, if CHG gets in your eyes and is not rinsed out. The CHG product catching  fire. Make sure that you avoid smoking and flames after applying CHG to your skin. Do not use CHG: If you have a chlorhexidine allergy or have previously reacted to chlorhexidine. On babies younger than 322 months of age. How to use CHG solution Use CHG only as told by your health care provider, and follow the instructions on the label. Use the full amount of CHG as directed. Usually, this is one bottle. During a shower Follow these steps when using CHG solution during a shower (unless your health care provider gives you different instructions): Start the shower. Use your normal soap and shampoo to wash your face and hair. Turn off the shower or move out of the shower stream. Pour the CHG onto a clean washcloth. Do not use any type of brush or rough-edged sponge. Starting at your neck, lather your body down to your toes. Make sure you follow these instructions: If you will be having surgery, pay special attention to the part of your body where you will be having surgery. Scrub this area for at least 1 minute. Do not use CHG on your head or face. If the solution gets into your ears or eyes, rinse them well with water. Avoid your genital area. Avoid any areas of skin that have broken skin, cuts, or scrapes. Scrub your back and under your arms. Make sure to wash skin folds. Let the lather sit on your skin for 1-2 minutes or as long as told by your  health care provider. Thoroughly rinse your entire body in the shower. Make sure that all body creases and crevices are rinsed well. Dry off with a clean towel. Do not put any substances on your body afterward--such as powder, lotion, or perfume--unless you are told to do so by your health care provider. Only use lotions that are recommended by the manufacturer. Put on clean clothes or pajamas. If it is the night before your surgery, sleep in clean sheets.  During a sponge bath Follow these steps when using CHG solution during a sponge bath (unless  your health care provider gives you different instructions): Use your normal soap and shampoo to wash your face and hair. Pour the CHG onto a clean washcloth. Starting at your neck, lather your body down to your toes. Make sure you follow these instructions: If you will be having surgery, pay special attention to the part of your body where you will be having surgery. Scrub this area for at least 1 minute. Do not use CHG on your head or face. If the solution gets into your ears or eyes, rinse them well with water. Avoid your genital area. Avoid any areas of skin that have broken skin, cuts, or scrapes. Scrub your back and under your arms. Make sure to wash skin folds. Let the lather sit on your skin for 1-2 minutes or as long as told by your health care provider. Using a different clean, wet washcloth, thoroughly rinse your entire body. Make sure that all body creases and crevices are rinsed well. Dry off with a clean towel. Do not put any substances on your body afterward--such as powder, lotion, or perfume--unless you are told to do so by your health care provider. Only use lotions that are recommended by the manufacturer. Put on clean clothes or pajamas. If it is the night before your surgery, sleep in clean sheets. How to use CHG prepackaged cloths Only use CHG cloths as told by your health care provider, and follow the instructions on the label. Use the CHG cloth on clean, dry skin. Do not use the CHG cloth on your head or face unless your health care provider tells you to. When washing with the CHG cloth: Avoid your genital area. Avoid any areas of skin that have broken skin, cuts, or scrapes. Before surgery Follow these steps when using a CHG cloth to clean before surgery (unless your health care provider gives you different instructions): Using the CHG cloth, vigorously scrub the part of your body where you will be having surgery. Scrub using a back-and-forth motion for 3 minutes. The  area on your body should be completely wet with CHG when you are done scrubbing. Do not rinse. Discard the cloth and let the area air-dry. Do not put any substances on the area afterward, such as powder, lotion, or perfume. Put on clean clothes or pajamas. If it is the night before your surgery, sleep in clean sheets.  For general bathing Follow these steps when using CHG cloths for general bathing (unless your health care provider gives you different instructions). Use a separate CHG cloth for each area of your body. Make sure you wash between any folds of skin and between your fingers and toes. Wash your body in the following order, switching to a new cloth after each step: The front of your neck, shoulders, and chest. Both of your arms, under your arms, and your hands. Your stomach and groin area, avoiding the genitals. Your right leg and foot.  Your left leg and foot. The back of your neck, your back, and your buttocks. Do not rinse. Discard the cloth and let the area air-dry. Do not put any substances on your body afterward--such as powder, lotion, or perfume--unless you are told to do so by your health care provider. Only use lotions that are recommended by the manufacturer. Put on clean clothes or pajamas. Contact a health care provider if: Your skin gets irritated after scrubbing. You have questions about using your solution or cloth. You swallow any chlorhexidine. Call your local poison control center (725-310-2135 in the [place] Get help right away if: Your eyes itch badly, or they become very red or swollen. Your skin itches badly and is red or swollen. Your hearing changes. You have trouble seeing. You have swelling or tingling in your mouth or throat. You have trouble breathing. These symptoms may represent a serious problem that is an emergency. Do not wait to see if the symptoms will go away. Get medical help right away. Call your local emergency services (911 in the [place]  Do not drive yourself to the hospital. Summary Chlorhexidine gluconate (CHG) is a germ-killing (antiseptic) solution that is used to clean the skin. Cleaning your skin with CHG may help to lower your risk for infection. You may be given CHG to use for bathing. It may be in a bottle or in a prepackaged cloth to use on your skin. Carefully follow your health care provider's instructions and the instructions on the product label. Do not use CHG if you have a chlorhexidine allergy. Contact your health care provider if your skin gets irritated after scrubbing. This information is not intended to replace advice given to you by your health care provider. Make sure you discuss any questions you have with your health care provider. Document Revised: 11/24/2020 Document Reviewed: 11/24/2020 Elsevier Patient Education  2023 [employer].

## 2022-04-22 ENCOUNTER — Encounter (HOSPITAL_COMMUNITY)
Admission: RE | Admit: 2022-04-22 | Discharge: 2022-04-22 | Disposition: A | Discharge status: Home / Self Care | Payer: Medicare HMO | Source: Ambulatory Visit | Attending: Orthopedic Surgery | Admitting: Orthopedic Surgery

## 2022-04-22 ENCOUNTER — Encounter (HOSPITAL_COMMUNITY): Payer: Self-pay

## 2022-04-22 VITALS — BP 128/68 | HR 62 | Temp 98.4°F | Resp 18 | Ht 70.5 in | Wt 256.0 lb

## 2022-04-22 DIAGNOSIS — M1612 Unilateral primary osteoarthritis, left hip: Secondary | ICD-10-CM | POA: Insufficient documentation

## 2022-04-22 DIAGNOSIS — E119 Type 2 diabetes mellitus without complications: Secondary | ICD-10-CM | POA: Insufficient documentation

## 2022-04-22 DIAGNOSIS — Z01818 Encounter for other preprocedural examination: Secondary | ICD-10-CM | POA: Diagnosis not present

## 2022-04-22 DIAGNOSIS — I10 Essential (primary) hypertension: Secondary | ICD-10-CM | POA: Insufficient documentation

## 2022-04-22 LAB — CBC WITH DIFFERENTIAL/PLATELET
Abs Immature Granulocytes: 0.02 10*3/uL (ref 0.00–0.07)
Basophils Absolute: 0.1 10*3/uL (ref 0.0–0.1)
Basophils Relative: 1 %
Eosinophils Absolute: 0.1 10*3/uL (ref 0.0–0.5)
Eosinophils Relative: 2 %
HCT: 42.8 % (ref 39.0–52.0)
Hemoglobin: 14.1 g/dL (ref 13.0–17.0)
Immature Granulocytes: 0 %
Lymphocytes Relative: 43 %
Lymphs Abs: 3.2 10*3/uL (ref 0.7–4.0)
MCH: 30.6 pg (ref 26.0–34.0)
MCHC: 32.9 g/dL (ref 30.0–36.0)
MCV: 92.8 fL (ref 80.0–100.0)
Monocytes Absolute: 0.6 10*3/uL (ref 0.1–1.0)
Monocytes Relative: 8 %
Neutro Abs: 3.4 10*3/uL (ref 1.7–7.7)
Neutrophils Relative %: 46 %
Platelets: 219 10*3/uL (ref 150–400)
RBC: 4.61 MIL/uL (ref 4.22–5.81)
RDW: 15 % (ref 11.5–15.5)
WBC: 7.3 10*3/uL (ref 4.0–10.5)
nRBC: 0 % (ref 0.0–0.2)

## 2022-04-22 LAB — TYPE AND SCREEN
ABO/RH(D): O POS
Antibody Screen: NEGATIVE

## 2022-04-22 LAB — BASIC METABOLIC PANEL
Anion gap: 8 (ref 5–15)
BUN: 18 mg/dL (ref 8–23)
CO2: 25 mmol/L (ref 22–32)
Calcium: 9.1 mg/dL (ref 8.9–10.3)
Chloride: 106 mmol/L (ref 98–111)
Creatinine, Ser: 1.17 mg/dL (ref 0.61–1.24)
GFR, Estimated: >60 mL/min (ref >60)
Glucose, Bld: 84 mg/dL (ref 70–99)
Potassium: 3.7 mmol/L (ref 3.5–5.1)
Sodium: 139 mmol/L (ref 135–145)

## 2022-04-22 LAB — HEMOGLOBIN A1C
Hgb A1c MFr Bld: 6 % — ABNORMAL HIGH (ref 4.8–5.6)
Mean Plasma Glucose: 125.5 mg/dL

## 2022-04-22 LAB — SURGICAL PCR SCREEN
MRSA, PCR: NEGATIVE
Staphylococcus aureus: NEGATIVE

## 2022-04-22 LAB — PREPARE RBC (CROSSMATCH)

## 2022-04-26 NOTE — H&P (Signed)
TOTAL HIP ADMISSION H&P  Patient is admitted for left total hip arthroplasty.  Subjective:  Chief Complaint: left hip pain  HPI:  Chief Complaint  Patient presents with   Hip Pain      Left / getting worse        66 year old male status post right total hip done in [facility] by Dr. Charlann Boxer seen in 2020 for left hip pain and lower back pain presents with worsening left hip pain.  Interestingly he says once he gets on the treadmill his pain goes away and he did get some relief from gabapentin 100 mg 3 times a day from primary care  However the patient did not want to have surgery right away so he tried to take the gabapentin and deal with it but the pain became worse and he presented back to the office requesting surgical intervention.  He has pain in the left hip and groin decreased range of motion trouble walking trouble with his ADLs and getting dressed  He says he would like something stronger than the hydrocodone that he is taking  He is diabetic has hypertension  He has coronary artery disease with a recent cath in 2016 stent placement same time Patient Active Problem List   Diagnosis Date Noted   CAD, multiple vessel, s/p DES to pLCX and dLCX, and pRCA 10/22/14 10/23/2014   DM2 (diabetes mellitus, type 2) (HCC) 10/23/2014   Tobacco use, stopped 6 months ago  10/23/2014   GERD (gastroesophageal reflux disease) 10/23/2014   Hyperlipidemia LDL goal <70 10/23/2014   NSTEMI (non-ST elevated myocardial infarction) (HCC) 10/22/2014   Past Medical History:  Diagnosis Date   CAD, multiple vessel 10/23/2014   Chronic back pain    Chronic hip pain    Diabetes mellitus    DM2 (diabetes mellitus, type 2) (HCC) 10/23/2014   GERD (gastroesophageal reflux disease)    Heart attack (HCC)    Hyperlipidemia    Hyperlipidemia LDL goal <70 10/23/2014   Hypertension    Tobacco use, stopped 6 months ago  10/23/2014    Past Surgical History:  Procedure Laterality Date   LEFT HEART  CATHETERIZATION WITH CORONARY ANGIOGRAM N/A 10/22/2014   Procedure: LEFT HEART CATHETERIZATION WITH CORONARY ANGIOGRAM;  Surgeon: Corky Crafts, MD;  Location: [facility] CATH LAB;  Service: Cardiovascular;  Laterality: N/A;   PERCUTANEOUS CORONARY STENT INTERVENTION (PCI-S)  10/22/2014   Procedure: PERCUTANEOUS CORONARY STENT INTERVENTION (PCI-S);  Surgeon: Corky Crafts, MD;  Location: [facility] CATH LAB;  Service: Cardiovascular;;  x3 to prox RCA , Prox CFX and dist CFX   TOTAL HIP ARTHROPLASTY Right     No current facility-administered medications for this encounter.   Current Outpatient Medications  Medication Sig Dispense Refill Last Dose   aspirin EC 81 MG tablet Take 81 mg by mouth daily.      diclofenac (VOLTAREN) 75 MG EC tablet Take 75 mg by mouth 2 (two) times daily.      gabapentin (NEURONTIN) 300 MG capsule Take 1 capsule (300 mg total) by mouth 3 (three) times daily. 90 capsule 5    HYDROcodone-acetaminophen (NORCO/VICODIN) 5-325 MG tablet Take 1 tablet by mouth every 4 (four) hours as needed for moderate pain or severe pain.      lisinopril-hydrochlorothiazide (PRINZIDE) 10-12.5 MG per tablet Take 1 tablet by mouth daily. (Patient taking differently: Take 2 tablets by mouth daily.) 30 tablet 12    metFORMIN (GLUCOPHAGE) 1000 MG tablet Take 1,000 mg by mouth 2 (two) times daily  with a meal.      Multiple Vitamins-Minerals (MULTIVITAMIN WITH MINERALS) tablet Take 1 tablet by mouth daily. Centrum 50+ Mega Men      pravastatin (PRAVACHOL) 20 MG tablet Take 20 mg by mouth every Monday.      tadalafil (CIALIS) 5 MG tablet Take 1 tablet (5 mg total) by mouth daily as needed for erectile dysfunction. (Patient taking differently: Take 5 mg by mouth daily.) 30 tablet 11    No Known Allergies  Social History   Tobacco Use   Smoking status: Former    Types: Cigarettes    Start date: 04/21/2014   Smokeless tobacco: Never  Substance Use Topics   Alcohol use: Yes    Alcohol/week: 0.0  standard drinks of alcohol    Comment: drinks beer on weekends    Family History  Problem Relation Age of Onset   Alzheimer's disease Mother    Heart disease Father    Heart disease Sister      Review of Systems  Objective:  Physical Exam  PHYSICAL EXAM SECTION: There were no vitals taken for this visit.  There is no height or weight on file to calculate BMI.   General appearance: Well-developed well-nourished no gross deformities  Eyes clear normal vision no evidence of conjunctivitis or jaundice, extraocular muscles intact  ENT: ears hearing normal, nasal passages clear, throat clear   Lymph nodes: No lymphadenopathy  Neck is supple without palpable mass, full range of motion  Cardiovascular normal pulse and perfusion in all 4 extremities normal color without edema  Neurologically deep tendon reflexes are equal and normal, no sensation loss or deficits no pathologic reflexes  Psychological: Awake alert and oriented x3 mood and affect normal  Skin no lacerations or ulcerations no nodularity no palpable masses, no erythema or nodularity  Left hip he has groin pain decreased rotation pain with flexion decreased abduction no discernible leg length discrepancy  Vital signs in last 24 hours:    Labs:   Estimated body mass index is 36.21 kg/m as calculated from the following:   Height as of 04/22/22: 5' 10.5" (1.791 m).   Weight as of 04/22/22: 116.1 kg.   Imaging Review Plain radiographs demonstrate severe degenerative joint disease of the left hip(s). The bone quality appears to be good for age and reported activity level.      Assessment/Plan:  End stage arthritis, left hip(s)  Left total hip  The patient history, physical examination, clinical judgement of the provider and imaging studies are consistent with end stage degenerative joint disease of the left hip(s) and total hip arthroplasty is deemed medically necessary. The treatment options including  medical management, injection therapy, arthroscopy and arthroplasty were discussed at length. The risks and benefits of total hip arthroplasty were presented and reviewed. The risks due to aseptic loosening, infection, stiffness, dislocation/subluxation,  thromboembolic complications and other imponderables were discussed.  The patient acknowledged the explanation, agreed to proceed with the plan and consent was signed. Patient is being admitted for inpatient treatment for surgery, pain control, PT, OT, prophylactic antibiotics, VTE prophylaxis, progressive ambulation and ADL's and discharge planning.The patient is planning to be discharged home with home health services

## 2022-04-27 ENCOUNTER — Other Ambulatory Visit: Payer: Self-pay

## 2022-04-27 ENCOUNTER — Observation Stay (HOSPITAL_COMMUNITY)
Admission: RE | Admit: 2022-04-27 | Discharge: 2022-04-28 | Disposition: A | Discharge status: Home Health | Payer: Medicare HMO | Source: Ambulatory Visit | Attending: Orthopedic Surgery | Admitting: Orthopedic Surgery

## 2022-04-27 ENCOUNTER — Ambulatory Visit (HOSPITAL_BASED_OUTPATIENT_CLINIC_OR_DEPARTMENT_OTHER): Payer: Medicare HMO | Admitting: Anesthesiology

## 2022-04-27 ENCOUNTER — Ambulatory Visit (HOSPITAL_COMMUNITY): Payer: Medicare HMO | Admitting: Anesthesiology

## 2022-04-27 ENCOUNTER — Encounter (HOSPITAL_COMMUNITY): Payer: Self-pay | Admitting: Orthopedic Surgery

## 2022-04-27 ENCOUNTER — Encounter (HOSPITAL_COMMUNITY)
Admission: RE | Disposition: A | Discharge status: Home Health | Payer: Self-pay | Source: Ambulatory Visit | Attending: Orthopedic Surgery

## 2022-04-27 ENCOUNTER — Ambulatory Visit (HOSPITAL_COMMUNITY): Payer: Medicare HMO

## 2022-04-27 DIAGNOSIS — Z79899 Other long term (current) drug therapy: Secondary | ICD-10-CM | POA: Insufficient documentation

## 2022-04-27 DIAGNOSIS — I10 Essential (primary) hypertension: Secondary | ICD-10-CM | POA: Diagnosis not present

## 2022-04-27 DIAGNOSIS — E119 Type 2 diabetes mellitus without complications: Secondary | ICD-10-CM | POA: Diagnosis not present

## 2022-04-27 DIAGNOSIS — Z7982 Long term (current) use of aspirin: Secondary | ICD-10-CM | POA: Insufficient documentation

## 2022-04-27 DIAGNOSIS — Z7984 Long term (current) use of oral hypoglycemic drugs: Secondary | ICD-10-CM | POA: Diagnosis not present

## 2022-04-27 DIAGNOSIS — I2510 Atherosclerotic heart disease of native coronary artery without angina pectoris: Secondary | ICD-10-CM

## 2022-04-27 DIAGNOSIS — Z955 Presence of coronary angioplasty implant and graft: Secondary | ICD-10-CM | POA: Insufficient documentation

## 2022-04-27 DIAGNOSIS — Z87891 Personal history of nicotine dependence: Secondary | ICD-10-CM

## 2022-04-27 DIAGNOSIS — M162 Bilateral osteoarthritis resulting from hip dysplasia: Secondary | ICD-10-CM | POA: Diagnosis not present

## 2022-04-27 DIAGNOSIS — Z96641 Presence of right artificial hip joint: Secondary | ICD-10-CM | POA: Insufficient documentation

## 2022-04-27 DIAGNOSIS — M1612 Unilateral primary osteoarthritis, left hip: Secondary | ICD-10-CM

## 2022-04-27 DIAGNOSIS — Z96642 Presence of left artificial hip joint: Secondary | ICD-10-CM | POA: Diagnosis not present

## 2022-04-27 DIAGNOSIS — Z471 Aftercare following joint replacement surgery: Secondary | ICD-10-CM | POA: Diagnosis not present

## 2022-04-27 HISTORY — PX: TOTAL HIP ARTHROPLASTY: SHX124

## 2022-04-27 LAB — GLUCOSE, CAPILLARY
Glucose-Capillary: 101 mg/dL — ABNORMAL HIGH (ref 70–99)
Glucose-Capillary: 98 mg/dL (ref 70–99)
Glucose-Capillary: 154 mg/dL — ABNORMAL HIGH (ref 70–99)
Glucose-Capillary: 141 mg/dL — ABNORMAL HIGH (ref 70–99)

## 2022-04-27 SURGERY — ARTHROPLASTY, HIP, TOTAL,POSTERIOR APPROACH
Anesthesia: Spinal | Site: Hip | Laterality: Left

## 2022-04-27 MED ORDER — DEXAMETHASONE SODIUM PHOSPHATE 10 MG/ML IJ SOLN
INTRAMUSCULAR | Status: DC | PRN
  Administered 2022-04-27: 4 mg via INTRAVENOUS

## 2022-04-27 MED ORDER — METOCLOPRAMIDE HCL 5 MG/ML IJ SOLN: 5.0000 mg | Freq: Three times a day (TID) | INTRAMUSCULAR | Status: DC | PRN

## 2022-04-27 MED ORDER — HEMOSTATIC AGENTS (NO CHARGE) OPTIME
TOPICAL | Status: DC | PRN
  Administered 2022-04-27: 1 via TOPICAL

## 2022-04-27 MED ORDER — PROPOFOL 10 MG/ML IV BOLUS
INTRAVENOUS | Status: AC
  Filled 2022-04-27: qty 20

## 2022-04-27 MED ORDER — PREGABALIN 50 MG PO CAPS
50.0000 mg | ORAL_CAPSULE | Freq: Once | ORAL | Status: AC
  Administered 2022-04-27: 50 mg via ORAL
  Filled 2022-04-27: qty 1

## 2022-04-27 MED ORDER — METHOCARBAMOL 1000 MG/10ML IJ SOLN: 500.0000 mg | Freq: Four times a day (QID) | INTRAVENOUS | Status: DC | PRN

## 2022-04-27 MED ORDER — PANTOPRAZOLE SODIUM 40 MG PO TBEC
40.0000 mg | DELAYED_RELEASE_TABLET | Freq: Every day | ORAL | Status: DC
  Administered 2022-04-27 – 2022-04-28 (×2): 40 mg via ORAL
  Filled 2022-04-27 (×2): qty 1

## 2022-04-27 MED ORDER — POVIDONE-IODINE 10 % EX SWAB: 2.0000 | Freq: Once | CUTANEOUS | Status: DC

## 2022-04-27 MED ORDER — PHENYLEPHRINE 80 MCG/ML (10ML) SYRINGE FOR IV PUSH (FOR BLOOD PRESSURE SUPPORT)
PREFILLED_SYRINGE | INTRAVENOUS | Status: DC | PRN
  Administered 2022-04-27 (×6): 80 ug via INTRAVENOUS
  Administered 2022-04-27: 160 ug via INTRAVENOUS

## 2022-04-27 MED ORDER — CHLORHEXIDINE GLUCONATE 0.12 % MT SOLN
OROMUCOSAL | Status: AC
  Filled 2022-04-27: qty 15

## 2022-04-27 MED ORDER — FENTANYL CITRATE PF 50 MCG/ML IJ SOSY
25.0000 ug | PREFILLED_SYRINGE | INTRAMUSCULAR | Status: DC | PRN
  Administered 2022-04-27: 50 ug via INTRAVENOUS
  Filled 2022-04-27 (×2): qty 1

## 2022-04-27 MED ORDER — ONDANSETRON HCL 4 MG/2ML IJ SOLN
4.0000 mg | Freq: Once | INTRAMUSCULAR | Status: AC
  Administered 2022-04-27: 4 mg via INTRAVENOUS
  Filled 2022-04-27: qty 2

## 2022-04-27 MED ORDER — POLYETHYLENE GLYCOL 3350 17 G PO PACK: 17.0000 g | PACK | Freq: Every day | ORAL | Status: DC | PRN

## 2022-04-27 MED ORDER — MENTHOL 3 MG MT LOZG: 1.0000 | LOZENGE | OROMUCOSAL | Status: DC | PRN

## 2022-04-27 MED ORDER — CEFAZOLIN SODIUM-DEXTROSE 2-4 GM/100ML-% IV SOLN
2.0000 g | INTRAVENOUS | Status: AC
  Administered 2022-04-27: 2 g via INTRAVENOUS

## 2022-04-27 MED ORDER — CHLORHEXIDINE GLUCONATE 0.12 % MT SOLN
15.0000 mL | Freq: Once | OROMUCOSAL | Status: AC
  Administered 2022-04-27: 15 mL via OROMUCOSAL

## 2022-04-27 MED ORDER — PHENYLEPHRINE HCL-NACL 20-0.9 MG/250ML-% IV SOLN
INTRAVENOUS | Status: DC | PRN
  Administered 2022-04-27: 50 ug/min via INTRAVENOUS

## 2022-04-27 MED ORDER — METHOCARBAMOL 1000 MG/10ML IJ SOLN
500.0000 mg | Freq: Once | INTRAVENOUS | Status: AC
  Administered 2022-04-27: 500 mg via INTRAVENOUS
  Filled 2022-04-27: qty 5

## 2022-04-27 MED ORDER — ONDANSETRON HCL 4 MG/2ML IJ SOLN: 4.0000 mg | Freq: Once | INTRAMUSCULAR | Status: DC | PRN

## 2022-04-27 MED ORDER — METFORMIN HCL 500 MG PO TABS
1000.0000 mg | ORAL_TABLET | Freq: Two times a day (BID) | ORAL | Status: DC
  Administered 2022-04-27 – 2022-04-28 (×2): 1000 mg via ORAL
  Filled 2022-04-27 (×2): qty 2

## 2022-04-27 MED ORDER — PHENYLEPHRINE HCL (PRESSORS) 10 MG/ML IV SOLN
INTRAVENOUS | Status: AC
  Filled 2022-04-27: qty 1

## 2022-04-27 MED ORDER — ONDANSETRON HCL 4 MG PO TABS: 4.0000 mg | ORAL_TABLET | Freq: Four times a day (QID) | ORAL | Status: DC | PRN

## 2022-04-27 MED ORDER — BISACODYL 5 MG PO TBEC: 5.0000 mg | DELAYED_RELEASE_TABLET | Freq: Every day | ORAL | Status: DC | PRN

## 2022-04-27 MED ORDER — HYDROMORPHONE HCL 1 MG/ML IJ SOLN: 0.5000 mg | INTRAMUSCULAR | Status: DC | PRN

## 2022-04-27 MED ORDER — PHENYLEPHRINE 80 MCG/ML (10ML) SYRINGE FOR IV PUSH (FOR BLOOD PRESSURE SUPPORT)
PREFILLED_SYRINGE | INTRAVENOUS | Status: AC
  Filled 2022-04-27: qty 10

## 2022-04-27 MED ORDER — METHOCARBAMOL 500 MG PO TABS
500.0000 mg | ORAL_TABLET | Freq: Four times a day (QID) | ORAL | Status: DC | PRN
  Administered 2022-04-27: 500 mg via ORAL
  Filled 2022-04-27: qty 1

## 2022-04-27 MED ORDER — BUPIVACAINE LIPOSOME 1.3 % IJ SUSP
INTRAMUSCULAR | Status: AC
  Filled 2022-04-27: qty 20

## 2022-04-27 MED ORDER — STERILE WATER FOR IRRIGATION IR SOLN
Status: DC | PRN
  Administered 2022-04-27: 1000 mL

## 2022-04-27 MED ORDER — DEXAMETHASONE SODIUM PHOSPHATE 10 MG/ML IJ SOLN
INTRAMUSCULAR | Status: AC
  Filled 2022-04-27: qty 1

## 2022-04-27 MED ORDER — METOCLOPRAMIDE HCL 10 MG PO TABS: 5.0000 mg | ORAL_TABLET | Freq: Three times a day (TID) | ORAL | Status: DC | PRN

## 2022-04-27 MED ORDER — TRANEXAMIC ACID-NACL 1000-0.7 MG/100ML-% IV SOLN
1000.0000 mg | INTRAVENOUS | Status: AC
  Administered 2022-04-27: 1000 mg via INTRAVENOUS
  Filled 2022-04-27: qty 100

## 2022-04-27 MED ORDER — MIDAZOLAM HCL 2 MG/2ML IJ SOLN
INTRAMUSCULAR | Status: DC | PRN
  Administered 2022-04-27: 2 mg via INTRAVENOUS

## 2022-04-27 MED ORDER — CELECOXIB 400 MG PO CAPS
400.0000 mg | ORAL_CAPSULE | Freq: Once | ORAL | Status: AC
  Administered 2022-04-27: 400 mg via ORAL
  Filled 2022-04-27: qty 1

## 2022-04-27 MED ORDER — SODIUM CHLORIDE 0.9 % IR SOLN
Status: DC | PRN
  Administered 2022-04-27: 3000 mL

## 2022-04-27 MED ORDER — TRANEXAMIC ACID-NACL 1000-0.7 MG/100ML-% IV SOLN
1000.0000 mg | Freq: Once | INTRAVENOUS | Status: AC
  Administered 2022-04-27: 1000 mg via INTRAVENOUS
  Filled 2022-04-27: qty 100

## 2022-04-27 MED ORDER — ALUM & MAG HYDROXIDE-SIMETH 200-200-20 MG/5ML PO SUSP
30.0000 mL | ORAL | Status: DC | PRN
Start: 2022-04-27 — End: 2022-04-28

## 2022-04-27 MED ORDER — GABAPENTIN 300 MG PO CAPS
300.0000 mg | ORAL_CAPSULE | Freq: Three times a day (TID) | ORAL | Status: DC
  Administered 2022-04-27 – 2022-04-28 (×3): 300 mg via ORAL
  Filled 2022-04-27 (×3): qty 1

## 2022-04-27 MED ORDER — OXYCODONE HCL 5 MG PO TABS
5.0000 mg | ORAL_TABLET | ORAL | Status: DC | PRN
  Administered 2022-04-27: 5 mg via ORAL
  Administered 2022-04-28: 10 mg via ORAL
  Filled 2022-04-27: qty 2
  Filled 2022-04-27: qty 1
  Filled 2022-04-27: qty 2

## 2022-04-27 MED ORDER — ONDANSETRON HCL 4 MG/2ML IJ SOLN
INTRAMUSCULAR | Status: AC
  Filled 2022-04-27: qty 2

## 2022-04-27 MED ORDER — FENTANYL CITRATE (PF) 250 MCG/5ML IJ SOLN
INTRAMUSCULAR | Status: AC
  Filled 2022-04-27: qty 5

## 2022-04-27 MED ORDER — INSULIN ASPART 100 UNIT/ML IJ SOLN
0.0000 [IU] | Freq: Three times a day (TID) | INTRAMUSCULAR | Status: DC
  Administered 2022-04-28: 2 [IU] via SUBCUTANEOUS

## 2022-04-27 MED ORDER — LISINOPRIL-HYDROCHLOROTHIAZIDE 10-12.5 MG PO TABS: 2.0000 | ORAL_TABLET | Freq: Every day | ORAL | Status: DC

## 2022-04-27 MED ORDER — OXYCODONE HCL 5 MG PO TABS
5.0000 mg | ORAL_TABLET | Freq: Once | ORAL | Status: AC
  Administered 2022-04-27: 5 mg via ORAL
  Filled 2022-04-27: qty 1

## 2022-04-27 MED ORDER — ADULT MULTIVITAMIN W/MINERALS CH
1.0000 | ORAL_TABLET | Freq: Every day | ORAL | Status: DC
Start: 2022-04-27 — End: 2022-04-28
  Administered 2022-04-27 – 2022-04-28 (×2): 1 via ORAL
  Filled 2022-04-27 (×2): qty 1

## 2022-04-27 MED ORDER — LACTATED RINGERS IV SOLN: INTRAVENOUS | Status: DC | PRN

## 2022-04-27 MED ORDER — EPHEDRINE 5 MG/ML INJ
INTRAVENOUS | Status: AC
  Filled 2022-04-27: qty 5

## 2022-04-27 MED ORDER — GLYCOPYRROLATE PF 0.2 MG/ML IJ SOSY
PREFILLED_SYRINGE | INTRAMUSCULAR | Status: AC
  Filled 2022-04-27: qty 2

## 2022-04-27 MED ORDER — BUPIVACAINE LIPOSOME 1.3 % IJ SUSP
INTRAMUSCULAR | Status: DC | PRN
  Administered 2022-04-27: 20 mL

## 2022-04-27 MED ORDER — PROPOFOL 10 MG/ML IV BOLUS
INTRAVENOUS | Status: DC | PRN
  Administered 2022-04-27 (×2): 40 mg via INTRAVENOUS

## 2022-04-27 MED ORDER — LIDOCAINE HCL (PF) 2 % IJ SOLN
INTRAMUSCULAR | Status: AC
  Filled 2022-04-27: qty 5

## 2022-04-27 MED ORDER — CEFAZOLIN SODIUM-DEXTROSE 2-4 GM/100ML-% IV SOLN
2.0000 g | Freq: Four times a day (QID) | INTRAVENOUS | Status: AC
  Administered 2022-04-27 (×2): 2 g via INTRAVENOUS
  Filled 2022-04-27 (×2): qty 100

## 2022-04-27 MED ORDER — DOCUSATE SODIUM 100 MG PO CAPS
100.0000 mg | ORAL_CAPSULE | Freq: Two times a day (BID) | ORAL | Status: DC
  Administered 2022-04-27 – 2022-04-28 (×3): 100 mg via ORAL
  Filled 2022-04-27 (×3): qty 1

## 2022-04-27 MED ORDER — ROCURONIUM BROMIDE 10 MG/ML (PF) SYRINGE
PREFILLED_SYRINGE | INTRAVENOUS | Status: AC
  Filled 2022-04-27: qty 10

## 2022-04-27 MED ORDER — ACETAMINOPHEN 500 MG PO TABS
1000.0000 mg | ORAL_TABLET | Freq: Four times a day (QID) | ORAL | Status: DC
  Administered 2022-04-27 – 2022-04-28 (×3): 1000 mg via ORAL
  Filled 2022-04-27 (×3): qty 2

## 2022-04-27 MED ORDER — PROPOFOL 500 MG/50ML IV EMUL
INTRAVENOUS | Status: DC | PRN
  Administered 2022-04-27: 125 ug/kg/min via INTRAVENOUS

## 2022-04-27 MED ORDER — ORAL CARE MOUTH RINSE: 15.0000 mL | Freq: Once | OROMUCOSAL | Status: AC

## 2022-04-27 MED ORDER — PHENOL 1.4 % MT LIQD: 1.0000 | OROMUCOSAL | Status: DC | PRN

## 2022-04-27 MED ORDER — PRAVASTATIN SODIUM 10 MG PO TABS: 20.0000 mg | ORAL_TABLET | ORAL | Status: DC

## 2022-04-27 MED ORDER — ONDANSETRON HCL 4 MG/2ML IJ SOLN: 4.0000 mg | Freq: Four times a day (QID) | INTRAMUSCULAR | Status: DC | PRN

## 2022-04-27 MED ORDER — OXYCODONE HCL 5 MG PO TABS
10.0000 mg | ORAL_TABLET | ORAL | Status: DC | PRN
  Administered 2022-04-27: 10 mg via ORAL
  Administered 2022-04-28: 15 mg via ORAL
  Filled 2022-04-27: qty 3

## 2022-04-27 MED ORDER — ONDANSETRON HCL 4 MG/2ML IJ SOLN
INTRAMUSCULAR | Status: DC | PRN
  Administered 2022-04-27: 4 mg via INTRAVENOUS

## 2022-04-27 MED ORDER — HYDROCHLOROTHIAZIDE 25 MG PO TABS
25.0000 mg | ORAL_TABLET | Freq: Every day | ORAL | Status: DC
  Administered 2022-04-27 – 2022-04-28 (×2): 25 mg via ORAL
  Filled 2022-04-27 (×2): qty 1

## 2022-04-27 MED ORDER — BUPIVACAINE-EPINEPHRINE (PF) 0.5% -1:200000 IJ SOLN
INTRAMUSCULAR | Status: AC
  Filled 2022-04-27: qty 30

## 2022-04-27 MED ORDER — LACTATED RINGERS IV SOLN
INTRAVENOUS | Status: DC
  Administered 2022-04-27: 1000 mL via INTRAVENOUS

## 2022-04-27 MED ORDER — CEFAZOLIN SODIUM-DEXTROSE 2-4 GM/100ML-% IV SOLN
INTRAVENOUS | Status: AC
  Filled 2022-04-27: qty 100

## 2022-04-27 MED ORDER — MIDAZOLAM HCL 2 MG/2ML IJ SOLN
INTRAMUSCULAR | Status: AC
  Filled 2022-04-27: qty 2

## 2022-04-27 MED ORDER — ASPIRIN 325 MG PO TBEC
325.0000 mg | DELAYED_RELEASE_TABLET | Freq: Every day | ORAL | Status: DC
  Administered 2022-04-28: 325 mg via ORAL
  Filled 2022-04-27: qty 1

## 2022-04-27 MED ORDER — LISINOPRIL 10 MG PO TABS
20.0000 mg | ORAL_TABLET | Freq: Every day | ORAL | Status: DC
Start: 2022-04-27 — End: 2022-04-28
  Administered 2022-04-27 – 2022-04-28 (×2): 20 mg via ORAL
  Filled 2022-04-27 (×2): qty 2

## 2022-04-27 MED ORDER — 0.9 % SODIUM CHLORIDE (POUR BTL) OPTIME
TOPICAL | Status: DC | PRN
  Administered 2022-04-27: 1000 mL

## 2022-04-27 MED ORDER — ACETAMINOPHEN 325 MG PO TABS: 325.0000 mg | ORAL_TABLET | Freq: Four times a day (QID) | ORAL | Status: DC | PRN

## 2022-04-27 MED ORDER — DEXAMETHASONE SODIUM PHOSPHATE 10 MG/ML IJ SOLN
10.0000 mg | Freq: Once | INTRAMUSCULAR | Status: AC
  Administered 2022-04-28: 10 mg via INTRAVENOUS
  Filled 2022-04-27: qty 1

## 2022-04-27 MED ORDER — BUPIVACAINE IN DEXTROSE 0.75-8.25 % IT SOLN
INTRATHECAL | Status: DC | PRN
  Administered 2022-04-27: 15 mg via INTRATHECAL

## 2022-04-27 SURGICAL SUPPLY — 68 items
BIT DRILL 2.8X128 (BIT) ×3 IMPLANT
BLADE HEX COATED 2.75 (ELECTRODE) ×3 IMPLANT
BLADE SAGITTAL 25.0X1.27X90 (BLADE) ×3 IMPLANT
CLOTH BEACON ORANGE TIMEOUT ST (SAFETY) ×3 IMPLANT
COVER LIGHT HANDLE STERIS (MISCELLANEOUS) ×6 IMPLANT
CUP SECTOR GRIPTON 58MM (Orthopedic Implant) ×1 IMPLANT
DRAPE BACK TABLE (DRAPES) ×3 IMPLANT
DRAPE HIP W/POCKET STRL (MISCELLANEOUS) ×3 IMPLANT
DRAPE U-SHAPE 47X51 STRL (DRAPES) ×3 IMPLANT
DRSG MEPILEX BORDER 4X12 (GAUZE/BANDAGES/DRESSINGS) ×3 IMPLANT
DRSG MEPILEX SACRM 8.7X9.8 (GAUZE/BANDAGES/DRESSINGS) ×3 IMPLANT
DURAPREP 26ML APPLICATOR (WOUND CARE) ×6 IMPLANT
ELECT BLADE 6 FLAT ULTRCLN (ELECTRODE) ×1 IMPLANT
ELECT REM PT RETURN 9FT ADLT (ELECTROSURGICAL) ×2
ELECTRODE REM PT RTRN 9FT ADLT (ELECTROSURGICAL) ×2 IMPLANT
ELIMINATOR HOLE APEX DEPUY (Hips) ×1 IMPLANT
GAUZE 4X4 16PLY ~~LOC~~+RFID DBL (SPONGE) ×1 IMPLANT
GLOVE BIO SURGEON STRL SZ7 (GLOVE) ×4 IMPLANT
GLOVE BIOGEL PI IND STRL 7.0 (GLOVE) ×4 IMPLANT
GLOVE BIOGEL PI INDICATOR 7.0 (GLOVE) ×2
GLOVE ECLIPSE 6.5 STRL STRAW (GLOVE) ×2 IMPLANT
GLOVE SKINSENSE NS SZ8.0 LF (GLOVE) ×3
GLOVE SKINSENSE STRL SZ8.0 LF (GLOVE) IMPLANT
GLOVE SS N UNI LF 8.5 STRL (GLOVE) ×3 IMPLANT
GLOVE SURG POLYISO LF SZ8 (GLOVE) ×6 IMPLANT
GOWN STRL REUS W/TWL LRG LVL3 (GOWN DISPOSABLE) ×9 IMPLANT
GOWN STRL REUS W/TWL XL LVL3 (GOWN DISPOSABLE) ×3 IMPLANT
HANDPIECE INTERPULSE COAX TIP (DISPOSABLE) ×2
HEAD CERAMIC 36 PLUS5 (Hips) ×1 IMPLANT
HEMOSTAT SURGICEL 4X8 (HEMOSTASIS) ×1 IMPLANT
HOOD W/PEELAWAY (MISCELLANEOUS) ×11 IMPLANT
INST SET MAJOR BONE (KITS) ×3 IMPLANT
IV NS IRRIG 3000ML ARTHROMATIC (IV SOLUTION) ×3 IMPLANT
KIT BLADEGUARD II DBL (SET/KITS/TRAYS/PACK) ×3 IMPLANT
KIT TURNOVER KIT A (KITS) ×3 IMPLANT
LINER NEUTRAL 36X58 PLUS4 ×1 IMPLANT
MANIFOLD NEPTUNE II (INSTRUMENTS) ×3 IMPLANT
MARKER SKIN DUAL TIP RULER LAB (MISCELLANEOUS) ×3 IMPLANT
NDL HYPO 18GX1.5 BLUNT FILL (NEEDLE) IMPLANT
NDL HYPO 21X1.5 SAFETY (NEEDLE) ×2 IMPLANT
NDL MAYO 6 CRC TAPER PT (NEEDLE) IMPLANT
NEEDLE HYPO 18GX1.5 BLUNT FILL (NEEDLE) ×2 IMPLANT
NEEDLE HYPO 21X1.5 SAFETY (NEEDLE) ×2 IMPLANT
NEEDLE MAYO 6 CRC TAPER PT (NEEDLE) ×2 IMPLANT
NS IRRIG 1000ML POUR BTL (IV SOLUTION) ×3 IMPLANT
PACK TOTAL JOINT (CUSTOM PROCEDURE TRAY) ×3 IMPLANT
PAD ARMBOARD 7.5X6 YLW CONV (MISCELLANEOUS) ×3 IMPLANT
PIN STMN SNGL STERILE 9X3.6MM (PIN) ×6 IMPLANT
SCREW 6.5MMX25MM (Screw) ×1 IMPLANT
SET BASIN LINEN APH (SET/KITS/TRAYS/PACK) ×3 IMPLANT
SET HNDPC FAN SPRY TIP SCT (DISPOSABLE) ×2 IMPLANT
SPONGE T-LAP 18X18 ~~LOC~~+RFID (SPONGE) ×3 IMPLANT
STAPLER VISISTAT 35W (STAPLE) ×3 IMPLANT
STEM TRI LOC BPS SZ6 W GRIPTON IMPLANT
SUT BRALON NAB BRD #1 30IN (SUTURE) ×6 IMPLANT
SUT ETHIBOND 5 LR DA (SUTURE) ×3 IMPLANT
SUT MNCRL 0 VIOLET CTX 36 (SUTURE) ×2 IMPLANT
SUT MON AB 0 CT1 (SUTURE) ×1 IMPLANT
SUT MONOCRYL 0 CTX 36 (SUTURE) ×2
SUT VIC AB 1 CT1 27 (SUTURE) ×14
SUT VIC AB 1 CT1 27XBRD ANTBC (SUTURE) ×4 IMPLANT
SYR 20ML LL LF (SYRINGE) ×2 IMPLANT
SYR BULB IRRIG 60ML STRL (SYRINGE) ×3 IMPLANT
TOWEL OR 17X26 4PK STRL BLUE (TOWEL DISPOSABLE) ×3 IMPLANT
TRAY FOLEY MTR SLVR 16FR STAT (SET/KITS/TRAYS/PACK) ×3 IMPLANT
TRI LOC BPS SZ 6 W GRIPTON ×2 IMPLANT
WATER STERILE IRR 1000ML POUR (IV SOLUTION) ×1 IMPLANT
YANKAUER SUCT 12FT TUBE ARGYLE (SUCTIONS) ×3 IMPLANT

## 2022-04-27 NOTE — Anesthesia Preprocedure Evaluation (Signed)
Anesthesia Evaluation  Patient identified by MRN, date of birth, ID band Patient awake    Reviewed: Allergy & Precautions, H&P , NPO status , Patient's Chart, lab work & pertinent test results, reviewed documented beta blocker date and time   Airway Mallampati: II  TM Distance: >3 FB Neck ROM: full    Dental no notable dental hx.    Pulmonary neg pulmonary ROS, former smoker,    Pulmonary exam normal breath sounds clear to auscultation       Cardiovascular Exercise Tolerance: Good hypertension, + CAD and + Cardiac Stents   Rhythm:regular Rate:Normal     Neuro/Psych negative neurological ROS  negative psych ROS   GI/Hepatic Neg liver ROS, GERD  Medicated,  Endo/Other  diabetes, Type obesity  Renal/GU negative Renal ROS  negative genitourinary   Musculoskeletal   Abdominal   Peds  Hematology negative hematology ROS (+)   Anesthesia Other Findings   Reproductive/Obstetrics negative OB ROS                             Anesthesia Physical Anesthesia Plan  ASA: 3  Anesthesia Plan: Spinal   Post-op Pain Management:    Induction:   PONV Risk Score and Plan: Propofol infusion  Airway Management Planned:   Additional Equipment:   Intra-op Plan:   Post-operative Plan:   Informed Consent: I have reviewed the patients History and Physical, chart, labs and discussed the procedure including the risks, benefits and alternatives for the proposed anesthesia with the patient or authorized representative who has indicated his/her understanding and acceptance.     Dental Advisory Given  Plan Discussed with: CRNA  Anesthesia Plan Comments:         Anesthesia Quick Evaluation

## 2022-04-27 NOTE — Progress Notes (Signed)
Pt arrived to room 328 via bed after surgery. Received report from [place] [place] in PACU. See assessment. Will continue to monitor.

## 2022-04-27 NOTE — Progress Notes (Signed)
Attempted to call report to floor nurse, waiting on return call.

## 2022-04-27 NOTE — Brief Op Note (Addendum)
04/27/2022  11:01 AM  PATIENT:  [name]  66 y.o. male  PRE-OPERATIVE DIAGNOSIS:  left hip osteoarthritis  POST-OPERATIVE DIAGNOSIS:  left hip osteoarthritis  PROCEDURE:  Procedure(s): TOTAL HIP ARTHROPLASTY (Left)  Depuy Tri-Lock stem size 6 high offset press-fit  58  +4/58 liner 1, 25 mm screw, hole eliminator  36 head +5 length ceramic  Approach direct lateral  Operative findings severe arthritis head deformity, cartilage bear on the femoral head and the acetabulum  Multiple osteophytes  Contracture  On the table in the supine position he had a 2 cm leg length discrepancy  Very tight hip    SURGEON:  Surgeon(s) and Role:    Vickki Hearing, MD - Primary  PHYSICIAN ASSISTANT:   ASSISTANTS: Nikki Cox  ANESTHESIA:   spinal  EBL:  500 mL   BLOOD ADMINISTERED:none  DRAINS: none   LOCAL MEDICATIONS USED: Exparel hip capsule and musculature around the hip  SPECIMEN:  No Specimen  DISPOSITION OF SPECIMEN:  N/A  COUNTS:  YES  TOURNIQUET:  * No tourniquets in log *  DICTATION: .Dragon Dictation  PLAN OF CARE: Admit to inpatient   PATIENT DISPOSITION:  PACU - hemodynamically stable.   Delay start of Pharmacological VTE agent (>24hrs) due to surgical blood loss or risk of bleeding: yes

## 2022-04-27 NOTE — Progress Notes (Addendum)
CBG 154. Pt refused insulin. Fuller Canada, MD paged. No new orders at this time.

## 2022-04-27 NOTE — Transfer of Care (Signed)
Immediate Anesthesia Transfer of Care Note  Patient: [name]  Procedure(s) Performed: TOTAL HIP ARTHROPLASTY (Left: Hip)  Patient Location: PACU  Anesthesia Type:Spinal  Level of Consciousness: awake, alert  and oriented  Airway & Oxygen Therapy: Patient Spontanous Breathing and Patient connected to nasal cannula oxygen  Post-op Assessment: Report given to RN and Post -op Vital signs reviewed and stable  Post vital signs: Reviewed and stable  Last Vitals:  Vitals Value Taken Time  BP 97/66   Temp    Pulse 61   Resp 19 04/27/22 1109  SpO2 100%   Vitals shown include unvalidated device data.  Last Pain:  Vitals:   04/27/22 0707  TempSrc: Oral  PainSc: 4       Patients Stated Pain Goal: 8 (04/27/22 0707)  Complications: No notable events documented.

## 2022-04-27 NOTE — Anesthesia Procedure Notes (Signed)
Spinal  Patient location during procedure: OR Start time: 04/27/2022 8:02 AM Reason for block: surgical anesthesia Staffing Performed: resident/CRNA and other anesthesia staff  Resident/CRNA: Pearson Grippe, CRNA Other anesthesia staff: Charlestine Massed, RN Performed by: Windell Norfolk, MD Authorized by: Windell Norfolk, MD   Preanesthetic Checklist Completed: patient identified, IV checked, site marked, risks and benefits discussed, surgical consent, monitors and equipment checked, pre-op evaluation and timeout performed Spinal Block Patient position: sitting Prep: ChloraPrep Patient monitoring: heart rate, cardiac monitor, continuous pulse ox and blood pressure Approach: midline Location: L3-4 Injection technique: single-shot Needle Needle type: Pencan  Needle gauge: 24 G Needle length: 9 cm Additional Notes Spinal attempt x1 by SRNA, no CSF return, spinal attempt x2 by CRNA, successful +CSF

## 2022-04-27 NOTE — Plan of Care (Signed)
  Problem: Acute Rehab PT Goals(only PT should resolve) Goal: Pt Will Go Supine/Side To Sit Outcome: Progressing Flowsheets (Taken 04/27/2022 1527) Pt will go Supine/Side to Sit:  with modified independence  with supervision Goal: Patient Will Transfer Sit To/From Stand Outcome: Progressing Flowsheets (Taken 04/27/2022 1527) Patient will transfer sit to/from stand: with modified independence Goal: Pt Will Transfer Bed To Chair/Chair To Bed Outcome: Progressing Flowsheets (Taken 04/27/2022 1527) Pt will Transfer Bed to Chair/Chair to Bed: with modified independence Goal: Pt Will Ambulate Outcome: Progressing Flowsheets (Taken 04/27/2022 1527) Pt will Ambulate:  > 125 feet  with modified independence  with rolling walker   3:28 PM, 04/27/22 Ocie Bob, MPT Physical Therapist with [facility] [phone] office [phone] mobile phone

## 2022-04-27 NOTE — Interval H&P Note (Signed)
History and Physical Interval Note:  04/27/2022 7:19 AM  [name]  has presented today for surgery, with the diagnosis of left hip osteoarthritis.  The various methods of treatment have been discussed with the patient and family. After consideration of risks, benefits and other options for treatment, the patient has consented to  Procedure(s): TOTAL HIP ARTHROPLASTY (Left) as a surgical intervention.  The patient's history has been reviewed, patient examined, no change in status, stable for surgery.  I have reviewed the patient's chart and labs.  Questions were answered to the patient's satisfaction.     Fuller Canada

## 2022-04-27 NOTE — Evaluation (Signed)
Physical Therapy Evaluation Patient Details Name: [name] MRN: [id number] DOB: 1956-04-16 Today's Date: 04/27/2022  History of Present Illness  [name] is a 66 y/o male, s/p Left THA on 04/27/22 with the diagnosis of left hip osteoarthritis.  Clinical Impression  Patient demonstrates slow labored movement for sitting up at bedside requiring use of bed rail and Min assist to pull self to sitting good return for keeping LLE in neutral position with pain at baseline.  Patient demonstrates fair/good return for left heel to toe stepping during gait training without loss of balance and limited mostly due to fatigue and minor increase in left hip pain.  Patient tolerated sitting up in chair after therapy.  Patient will benefit from continued skilled physical therapy in hospital and recommended venue below to increase strength, balance, endurance for safe ADLs and gait.          Recommendations for follow up therapy are one component of a multi-disciplinary discharge planning process, led by the attending physician.  Recommendations may be updated based on patient status, additional functional criteria and insurance authorization.  Follow Up Recommendations Home health PT      Assistance Recommended at Discharge Set up Supervision/Assistance  Patient can return home with the following  A little help with walking and/or transfers;A little help with bathing/dressing/bathroom;Help with stairs or ramp for entrance;Assistance with cooking/housework    Equipment Recommendations None recommended by PT;BSC/3in1  Recommendations for Other Services       Functional Status Assessment Patient has had a recent decline in their functional status and demonstrates the ability to make significant improvements in function in a reasonable and predictable amount of time.     Precautions / Restrictions Precautions Precautions: Fall Restrictions Weight Bearing Restrictions: Yes LLE Weight  Bearing: Weight bearing as tolerated Other Position/Activity Restrictions: On the operated hip: Avoid hyperextension of hip, avoid external rotation, no abduction      Mobility  Bed Mobility Overal bed mobility: Needs Assistance Bed Mobility: Supine to Sit     Supine to sit: Min assist, HOB elevated     General bed mobility comments: increased time, HOB slightly elevated, had to use bed rail    Transfers Overall transfer level: Needs assistance Equipment used: Rolling walker (2 wheels) Transfers: Sit to/from Stand, Bed to chair/wheelchair/BSC Sit to Stand: Supervision   Step pivot transfers: Supervision       General transfer comment: slow labord movemen for completing sit to stands from bedside, good return for transferring to chair    Ambulation/Gait Ambulation/Gait assistance: Supervision Gait Distance (Feet): 100 Feet Assistive device: Rolling walker (2 wheels) Gait Pattern/deviations: Decreased step length - right, Decreased step length - left, Decreased stance time - left, Decreased stride length, Ataxic Gait velocity: decreased     General Gait Details: slow slightly labored cadence with fair/good return for left heel to toe stepping without loss of balance, occasional verbal cueing not to step to close to RW with good carryover demonstrated  Stairs            Wheelchair Mobility    Modified Rankin (Stroke Patients Only)       Balance Overall balance assessment: Needs assistance Sitting-balance support: Feet supported, No upper extremity supported Sitting balance-Leahy Scale: Good Sitting balance - Comments: seated at EOB   Standing balance support: During functional activity, Bilateral upper extremity supported Standing balance-Leahy Scale: Fair Standing balance comment: fair/good using RW  Pertinent Vitals/Pain Pain Assessment Pain Assessment: 0-10 Pain Score: 7  Pain Location: left hip Pain  Descriptors / Indicators: Sore, Discomfort Pain Intervention(s): Limited activity within patient's tolerance, Monitored during session, RN gave pain meds during session, Repositioned    Home Living Family/patient expects to be discharged to:: Private residence Living Arrangements: Spouse/significant other;Children Available Help at Discharge: Family;Available 24 hours/day Type of Home: House Home Access: Stairs to enter Entrance Stairs-Rails: None Entrance Stairs-Number of Steps: 2   Home Layout: One level Home Equipment: Agricultural consultant (2 wheels);Cane - single point;Grab bars - tub/shower;Shower seat      Prior Function Prior Level of Function : Independent/Modified Independent             Mobility Comments: Community ambulator using single point cane PRN, drives ADLs Comments: Independent     Hand Dominance   Dominant Hand: Left    Extremity/Trunk Assessment   Upper Extremity Assessment Upper Extremity Assessment: Overall WFL for tasks assessed    Lower Extremity Assessment Lower Extremity Assessment: Overall WFL for tasks assessed;LLE deficits/detail LLE Deficits / Details: grossly 4/5 LLE: Unable to fully assess due to pain LLE Sensation: WNL LLE Coordination: WNL    Cervical / Trunk Assessment Cervical / Trunk Assessment: Normal  Communication   Communication: No difficulties  Cognition Arousal/Alertness: Awake/alert Behavior During Therapy: WFL for tasks assessed/performed Overall Cognitive Status: Within Functional Limits for tasks assessed                                          General Comments      Exercises General Exercises - Lower Extremity Long Arc Quad: Seated, AROM, Strengthening, Left, 5 reps Heel Raises: Seated, AROM, Strengthening, Both, 5 reps   Assessment/Plan    PT Assessment Patient needs continued PT services  PT Problem List Decreased strength;Decreased activity tolerance;Decreased balance;Decreased  mobility       PT Treatment Interventions DME instruction;Gait training;Stair training;Functional mobility training;Therapeutic activities;Therapeutic exercise;Patient/family education;Balance training    PT Goals (Current goals can be found in the Care Plan section)  Acute Rehab PT Goals Patient Stated Goal: return home with family to assist PT Goal Formulation: With patient Time For Goal Achievement: 04/30/22 Potential to Achieve Goals: Good    Frequency 7X/week     Co-evaluation               AM-PAC PT "6 Clicks" Mobility  Outcome Measure Help needed turning from your back to your side while in a flat bed without using bedrails?: A Little Help needed moving from lying on your back to sitting on the side of a flat bed without using bedrails?: A Little Help needed moving to and from a bed to a chair (including a wheelchair)?: None Help needed standing up from a chair using your arms (e.g., wheelchair or bedside chair)?: A Little Help needed to walk in hospital room?: A Little Help needed climbing 3-5 steps with a railing? : A Little 6 Click Score: 19    End of Session   Activity Tolerance: Patient tolerated treatment well;Patient limited by fatigue Patient left: in chair;with call bell/phone within reach Nurse Communication: Mobility status PT Visit Diagnosis: Unsteadiness on feet (R26.81);Other abnormalities of gait and mobility (R26.89);Muscle weakness (generalized) (M62.81)    Time: 9937-1696 PT Time Calculation (min) (ACUTE ONLY): 33 min   Charges:   PT Evaluation $PT Eval Moderate Complexity: 1 Mod PT  Treatments $Therapeutic Activity: 23-37 mins        3:26 PM, 04/27/22 Ocie Bob, MPT Physical Therapist with [facility] [phone] office [phone] mobile phone

## 2022-04-27 NOTE — Op Note (Signed)
Left total hip arthroplasty  Pre op dx osteoarthritis left hip  Post op dx osteoarthritis left hip  Procedure left total hip arthroplasty  Surgeon Romeo Apple  Implants  Stem press-fit DePuy Tri-Lock size 6 high offset  Acetabulum 58 line to line Pinnacle 1 screw hole eliminator  Liner 58+4 liner 36  Femoral head 36 ceramic +5  Indication for surgery severe arthritis left hip   ASSISTANTS: Lowella Bandy Cox  ANESTHESIA:   Spinal  BLOOD ADMINISTERED: None  DRAINS: none   LOCAL MEDICATIONS USED: Exparel 20   SPECIMEN:  No Specimen  DISPOSITION OF SPECIMEN:  N/A  COUNTS: correct  DICTATION: .Dragon Dictation   The patient was taken to the recovery room in stable condition  PLAN OF CARE: PACU stable admit  PATIENT DISPOSITION:  PACU - hemodynamically stable.   Delay start of Pharmacological VTE agent (>24hrs) due to surgical blood loss or risk of bleeding: Yes  Details of surgery: The patient was identified by 2 approved identification mechanisms. The operative extremity was evaluated and found to be acceptable for surgical treatment today. The chart was reviewed. The surgical site was confirmed and marked.  The patient was taken to the operating room and given appropriate antibiotic Ancef 2 g. This is consistent with the SCIP protocol.  The patient was given the following anesthetic: Spinal followed by Foley catheter insertion  The patient was then placed in the lateral decubitus position with appropriate padding. The surgical site was prepped and draped sterilely.  Timeout was executed confirming the patient's name, surgical site, antibiotic administration, x-rays available, and implants were checked and were available.  Incision was made over the greater trochanter extended proximally and distally approximately 6 cm  Subcutaneous tissue was divided down to fascia which was then split in line with the skin incision and deep retractors were placed.  The greater  trochanteric bursa was resected exposing the abductors   The gluteus medius anterior half was subperiosteally dissected from the greater trochanter and tagged with #1 Vicryl sutures  The underlying gluteus minimus was split in continuity with the capsule and preserved tagging with Vicryl sutures as well  2 Steinmann pins were then placed in the pelvis to retract the soft tissue.  Remaining anterior capsule was excised.   The femoral head was removed and measured it had severe disease, all cartilage denuded  The acetabular was inspected: Also severe disease all cartilage denuded  The hip was dislocated anteriorly into a sterile bag  Proximal femur was prepared starting with a femoral neck cutting guide, box osteotome, starter hole reamer and canal finder.    Broaching was started with a size 0 and broached up to size 6 based on proximal fit and fill.  T  Acetabulum was prepped by removing the labrum with sharp dissection a key elevator was passed anteriorly and posteriorly and anterior and posterior retractors were placed.  There was a lot of synovial tissue and overgrowth and hyperemia which was excised especially inferiorly in this area bled a lot this was carefully dissected and cauterized and Surgicel was placed twice to gain control of the bleeding  We then reamed up to a size 58.  I used a 56 trial which fit down and nicely and confirmed acetabular positioning of the cup  The 58 cup was placed and 1 screw was placed   [facility] eliminator was passed followed by insertion of the 58+4/36 liner   Trial reduction was performed with size 6 stem +1.5 and then +5 x 36  femoral head trial  The hip was stable throughout the range of motion.  Leg lengths were restored there was excessive shuck with the 1.5 and the leg was still short I went up to the +5 and then got almost equal leg lengths appropriate shock, sleep test normal stable in external rotation and extension flexion to 90.  The hip was  tight in internal rotation but I was able to internally rotated 30 degrees.    The trial prosthesis on the femoral side was removed 2 drill holes were passed and a #5 Ethibond suture was passed  We then inserted the stem in the head and then reduced the hip again  I was able to reproduce my trial reduction then proceeded with irrigation injection of exparel followed by closure of the minimus with 1 Vicryl and then closure of the medius with #5 Ethibond #1 Vicryl and #1 Surgilon  The hip was then abducted the fascia was closed with #1 Braylon  Subcutaneous tissues were closed with 0 Monocryl in 2 layers followed by staples  A sterile dressing was applied  Leg lengths were within 5 mm of each other  The patient was taken recovery room in stable condition   Postop plan  Weightbearing as tolerated Direct lateral hip precautions DVT prophylaxis for 30 days Remove staples at 12 to 14 days Postop appointment scheduled for 28 days  [id number]

## 2022-04-28 ENCOUNTER — Encounter (HOSPITAL_COMMUNITY): Payer: Self-pay | Admitting: Orthopedic Surgery

## 2022-04-28 DIAGNOSIS — Z87891 Personal history of nicotine dependence: Secondary | ICD-10-CM | POA: Diagnosis not present

## 2022-04-28 DIAGNOSIS — I2510 Atherosclerotic heart disease of native coronary artery without angina pectoris: Secondary | ICD-10-CM | POA: Diagnosis not present

## 2022-04-28 DIAGNOSIS — I10 Essential (primary) hypertension: Secondary | ICD-10-CM | POA: Diagnosis not present

## 2022-04-28 DIAGNOSIS — M162 Bilateral osteoarthritis resulting from hip dysplasia: Secondary | ICD-10-CM | POA: Diagnosis not present

## 2022-04-28 DIAGNOSIS — E119 Type 2 diabetes mellitus without complications: Secondary | ICD-10-CM | POA: Diagnosis not present

## 2022-04-28 DIAGNOSIS — Z7984 Long term (current) use of oral hypoglycemic drugs: Secondary | ICD-10-CM | POA: Diagnosis not present

## 2022-04-28 DIAGNOSIS — Z96641 Presence of right artificial hip joint: Secondary | ICD-10-CM | POA: Diagnosis not present

## 2022-04-28 DIAGNOSIS — Z79899 Other long term (current) drug therapy: Secondary | ICD-10-CM | POA: Diagnosis not present

## 2022-04-28 DIAGNOSIS — Z7982 Long term (current) use of aspirin: Secondary | ICD-10-CM | POA: Diagnosis not present

## 2022-04-28 LAB — BASIC METABOLIC PANEL
Anion gap: 8 (ref 5–15)
BUN: 12 mg/dL (ref 8–23)
CO2: 26 mmol/L (ref 22–32)
Calcium: 8.7 mg/dL — ABNORMAL LOW (ref 8.9–10.3)
Chloride: 105 mmol/L (ref 98–111)
Creatinine, Ser: 0.84 mg/dL (ref 0.61–1.24)
GFR, Estimated: >60 mL/min (ref >60)
Glucose, Bld: 133 mg/dL — ABNORMAL HIGH (ref 70–99)
Potassium: 3.7 mmol/L (ref 3.5–5.1)
Sodium: 139 mmol/L (ref 135–145)

## 2022-04-28 LAB — CBC
HCT: 36.8 % — ABNORMAL LOW (ref 39.0–52.0)
Hemoglobin: 11.9 g/dL — ABNORMAL LOW (ref 13.0–17.0)
MCH: 30.2 pg (ref 26.0–34.0)
MCHC: 32.3 g/dL (ref 30.0–36.0)
MCV: 93.4 fL (ref 80.0–100.0)
Platelets: 168 10*3/uL (ref 150–400)
RBC: 3.94 MIL/uL — ABNORMAL LOW (ref 4.22–5.81)
RDW: 14.6 % (ref 11.5–15.5)
WBC: 10.5 10*3/uL (ref 4.0–10.5)
nRBC: 0 % (ref 0.0–0.2)

## 2022-04-28 LAB — GLUCOSE, CAPILLARY
Glucose-Capillary: 117 mg/dL — ABNORMAL HIGH (ref 70–99)
Glucose-Capillary: 166 mg/dL — ABNORMAL HIGH (ref 70–99)

## 2022-04-28 MED ORDER — ACETAMINOPHEN 500 MG PO TABS: 500.0000 mg | ORAL_TABLET | Freq: Four times a day (QID) | ORAL | 0 refills | Status: DC | PRN

## 2022-04-28 MED ORDER — POLYETHYLENE GLYCOL 3350 17 G PO PACK
17.0000 g | PACK | Freq: Every day | ORAL | 0 refills | Status: DC | PRN
Start: 2022-04-28 — End: 2022-10-21

## 2022-04-28 MED ORDER — DOCUSATE SODIUM 100 MG PO CAPS: 100.0000 mg | ORAL_CAPSULE | Freq: Two times a day (BID) | ORAL | 0 refills | Status: DC

## 2022-04-28 MED ORDER — OXYCODONE HCL 5 MG PO TABS: 5.0000 mg | ORAL_TABLET | Freq: Four times a day (QID) | ORAL | 0 refills | Status: AC | PRN

## 2022-04-28 MED ORDER — METHOCARBAMOL 500 MG PO TABS: 500.0000 mg | ORAL_TABLET | Freq: Four times a day (QID) | ORAL | 1 refills | Status: DC | PRN

## 2022-04-28 MED ORDER — ASPIRIN 325 MG PO TBEC: 325.0000 mg | DELAYED_RELEASE_TABLET | Freq: Every day | ORAL | 0 refills | Status: DC

## 2022-04-28 NOTE — Progress Notes (Signed)
Asper order Urethral Catheter Romie Levee, RN Latex 16 Fr Pulled. Pt tolerated well. Bedside urinal provided. Will continue to monitor.

## 2022-04-28 NOTE — Care Management Obs Status (Signed)
MEDICARE OBSERVATION STATUS NOTIFICATION   Patient Details  Name: [name] MRN: [id number] Date of Birth: May 09, 1956   Medicare Observation Status Notification Given:  Yes    Corey Harold 04/28/2022, 2:00 PM

## 2022-04-28 NOTE — TOC Transition Note (Signed)
Transition of Care [facility]) - CM/SW Discharge Note   Patient Details  Name: [name] MRN: [id number] Date of Birth: 11/10/1955  Transition of Care [facility]) CM/SW Contact:  Leitha Bleak, RN Phone Number: 04/28/2022, 10:42 AM   Clinical Narrative:   Patient discharging home.  Patient is agreeable to home health. [facility] accepted the referral. Patient is requesting a 3N1. Zach with [employer] will drop ship, TOC confirmed address with patient.    Final next level of care: Home w Home Health Services Barriers to Discharge: Barriers Resolved  Patient Goals and CMS Choice Patient states their goals for this hospitalization and ongoing recovery are:: to go home. CMS Medicare.gov Compare Post Acute Care list provided to:: Patient Choice offered to / list presented to : Patient  Discharge Placement          Patient and family notified of of transfer: 04/28/22  Discharge Plan and Services              DME Arranged: 3-N-1 DME Agency: [facility] Date DME Agency Contacted: 04/28/22 Time DME Agency Contacted: [phone] Representative spoke with at DME Agency: Ian Malkin HH Arranged: PT HH Agency: [facility] Date [facility] Agency Contacted: 04/28/22 Time HH Agency Contacted: 1041 Representative spoke with at [facility] Agency: Clifton Custard

## 2022-04-28 NOTE — Discharge Summary (Signed)
Physician Discharge Summary  Patient ID: [name] MRN: [id number] DOB/AGE: 64-Jan-1957 64 y.o.  Admit date: 04/27/2022 Discharge date: 04/28/2022  Admission Diagnoses: Osteoarthritis of the left hip  Discharge Diagnoses:  Principal Problem:   Osteoarthritis of left hip Active Problems:   Unilateral primary osteoarthritis, left hip   Discharged Condition: good  Hospital Course: Admission date August 1.  Surgery date August 1.  Implants DePuy Tri-Lock press-fit femoral stem size 6 with ceramic 36 head +5 neck length and Pinnacle cup with 1 screw, 58 cup with +4 offset liner  Significant Diagnostic Studies: labs:     Latest Ref Rng & Units 04/28/2022    6:04 AM 04/22/2022    1:33 PM 10/23/2014    2:48 AM  CBC  WBC 4.0 - 10.5 K/uL 10.5  7.3  6.2   Hemoglobin 13.0 - 17.0 g/dL 61.6  07.3  71.0   Hematocrit 39.0 - 52.0 % 36.8  42.8  41.5   Platelets 150 - 400 K/uL 168  219  192       Latest Ref Rng & Units 04/28/2022    6:04 AM 04/22/2022    1:33 PM 10/23/2014    2:48 AM  BMP  Glucose 70 - 99 mg/dL 626  84  948   BUN 8 - 23 mg/dL 12  18  7    Creatinine 0.61 - 1.24 mg/dL  5.46  2.70   Sodium 135 - 145 mmol/L 139  139  136   Potassium 3.5 - 5.1 mmol/L 3.7  3.7  3.8   Chloride 98 - 111 mmol/L 105  106  104   CO2 22 - 32 mmol/L 26  25  25    Calcium 8.9 - 10.3 mg/dL 8.7  9.1  8.8      Treatments: therapies: PT and surgery: Left total hip arthroplasty  Discharge Exam: Blood pressure 121/65, pulse 65, temperature 97.8 F (36.6 C), temperature source Oral, resp. rate 17, height 5' 10.5" (1.791 m), weight 116.1 kg, SpO2 100 %.   Mr. Coker appears to be in good condition  He has no peripheral edema calf is nontender  Wound: Dressing is dry  He is alert and oriented x3     Disposition: Discharge disposition: 01-Home or Self Care       Discharge Instructions     Call MD / Call 911   Complete by: As directed    If you experience chest pain or shortness  of breath, CALL 911 and be transported to the hospital emergency room.  If you develope a fever above 101 F, pus (white drainage) or increased drainage or redness at the wound, or calf pain, call your surgeon's office.   Constipation Prevention   Complete by: As directed    Drink plenty of fluids.  Prune juice may be helpful.  You may use a stool softener, such as Colace (over the counter) 100 mg twice a day.  Use MiraLax (over the counter) for constipation as needed.   DO NOT drive, shower or take a tub bath until instructed by your physician   Complete by: As directed    Diet - low sodium heart healthy   Complete by: As directed    Discharge wound care:   Complete by: As directed    If you have a hip bandage, keep it clean and dry.  Change your bandage as instructed by your health care providers.  If your bandage has been discontinued, keep your incision clean and dry.  Pat dry after bathing.  DO NOT put lotion or powder on your incision.   Do not sit on low chairs, stoools or toilet seats, as it may be difficult to get up from low surfaces   Complete by: As directed    Increase activity slowly as tolerated   Complete by: As directed    Post-operative opioid taper instructions:   Complete by: As directed    POST-OPERATIVE OPIOID TAPER INSTRUCTIONS: It is important to wean off of your opioid medication as soon as possible. If you do not need pain medication after your surgery it is ok to stop day one. Opioids include: Codeine, Hydrocodone(Norco, Vicodin), Oxycodone(Percocet, oxycontin) and hydromorphone amongst others.  Long term and even short term use of opiods can cause: Increased pain response Dependence Constipation Depression Respiratory depression And more.  Withdrawal symptoms can include Flu like symptoms Nausea, vomiting And more Techniques to manage these symptoms Hydrate well Eat regular healthy meals Stay active Use relaxation techniques(deep breathing, meditating,  yoga) Do Not substitute Alcohol to help with tapering If you have been on opioids for less than two weeks and do not have pain than it is ok to stop all together.  Plan to wean off of opioids This plan should start within one week post op of your joint replacement. Maintain the same interval or time between taking each dose and first decrease the dose.  Cut the total daily intake of opioids by one tablet each day Next start to increase the time between doses. The last dose that should be eliminated is the evening dose.         Allergies as of 04/28/2022   No Known Allergies      Medication List     STOP taking these medications    diclofenac 75 MG EC tablet Commonly known as: VOLTAREN   HYDROcodone-acetaminophen 5-325 MG tablet Commonly known as: NORCO/VICODIN       TAKE these medications    acetaminophen 500 MG tablet Commonly known as: TYLENOL Take 1 tablet (500 mg total) by mouth every 6 (six) hours as needed.   aspirin EC 325 MG tablet Take 1 tablet (325 mg total) by mouth daily with breakfast. Start taking on: April 29, 2022 What changed:  medication strength how much to take when to take this   docusate sodium 100 MG capsule Commonly known as: COLACE Take 1 capsule (100 mg total) by mouth 2 (two) times daily.   gabapentin 300 MG capsule Commonly known as: NEURONTIN Take 1 capsule (300 mg total) by mouth 3 (three) times daily.   lisinopril-hydrochlorothiazide 10-12.5 MG tablet Commonly known as: Prinzide Take 1 tablet by mouth daily. What changed: how much to take   metFORMIN 1000 MG tablet Commonly known as: GLUCOPHAGE Take 1,000 mg by mouth 2 (two) times daily with a meal.   methocarbamol 500 MG tablet Commonly known as: ROBAXIN Take 1 tablet (500 mg total) by mouth every 6 (six) hours as needed for muscle spasms.   multivitamin with minerals tablet Take 1 tablet by mouth daily. Centrum 50+ Mega Men   oxyCODONE 5 MG immediate release  tablet Commonly known as: Oxy IR/ROXICODONE Take 1 tablet (5 mg total) by mouth every 6 (six) hours as needed for up to 7 days for severe pain.   polyethylene glycol 17 g packet Commonly known as: MIRALAX / GLYCOLAX Take 17 g by mouth daily as needed for mild constipation.   pravastatin 20 MG tablet Commonly known as: PRAVACHOL Take  20 mg by mouth every Monday.   tadalafil 5 MG tablet Commonly known as: CIALIS Take 1 tablet (5 mg total) by mouth daily as needed for erectile dysfunction. What changed: when to take this               Durable Medical Equipment  (From admission, onward)           Start     Ordered   04/28/22 0857  For home use only DME Other see comment  Once       Comments: Sock aide reacher to assist patient in putting on socks Modified independently  Question:  Length of Need  Answer:  Lifetime   04/28/22 0858              Discharge Care Instructions  (From admission, onward)           Start     Ordered   04/28/22 0000  Discharge wound care:       Comments: If you have a hip bandage, keep it clean and dry.  Change your bandage as instructed by your health care providers.  If your bandage has been discontinued, keep your incision clean and dry.  Pat dry after bathing.  DO NOT put lotion or powder on your incision.   04/28/22 1227            Follow-up Information     [facility] Follow up.   Specialty: Home Health Services Why: PT will call to schedule your first home visit. Contact information: [address] [place] [address] [phone]         [facility] Follow up.   Why: 3 N 1 will be delivered to home address. Contact information: [address] [place] [address] [phone]                 Signed: Fuller Canada 04/28/2022, 12:27 PM

## 2022-04-28 NOTE — Progress Notes (Signed)
Patient ID: [name], male   DOB: Apr 02, 1956, 66 y.o.   MRN: [id number]  BP 121/65 (BP Location: Left Arm)   Pulse 65   Temp 97.8 F (36.6 C) (Oral)   Resp 17   Ht 5' 10.5" (1.791 m)   Wt 116.1 kg   SpO2 100%   BMI 36.21 kg/m   Pain controlled well  Dressing dry   Limb alignment normal      Latest Ref Rng & Units 04/28/2022    6:04 AM 04/22/2022    1:33 PM 10/23/2014    2:48 AM  CBC  WBC 4.0 - 10.5 K/uL 10.5  7.3  6.2   Hemoglobin 13.0 - 17.0 g/dL 22.2  97.9  89.2   Hematocrit 39.0 - 52.0 % 36.8  42.8  41.5   Platelets 150 - 400 K/uL 168  219  192        Latest Ref Rng & Units 04/28/2022    6:04 AM 04/22/2022    1:33 PM 10/23/2014    2:48 AM  BMP  Glucose 70 - 99 mg/dL 119  84  417   BUN 8 - 23 mg/dL 12  18  7    Creatinine 0.61 - 1.24 mg/dL  4.08  1.44   Sodium 135 - 145 mmol/L 139  139  136   Potassium 3.5 - 5.1 mmol/L 3.7  3.7  3.8   Chloride 98 - 111 mmol/L 105  106  104   CO2 22 - 32 mmol/L 26  25  25    Calcium 8.9 - 10.3 mg/dL 8.7  9.1  8.8     PT then poss D/C

## 2022-04-28 NOTE — Progress Notes (Signed)
Physical Therapy Treatment Patient Details Name: [name] MRN: [id number] DOB: [birth year] Today's Date: 04/28/2022   History of Present Illness [name] is a 66 y/o male, s/p Left THA on 04/27/22 with the diagnosis of left hip osteoarthritis.    PT Comments    Patient demonstrates fair/good return for propping up on elbows to hands and scooting to EOB with Min guard/Min assist while keeping LLE in neutral position, increased BLE strength for completing sit to stands using RW and increased endurance/distance for gait training with fair/good return for left heel to toe stepping without loss of balance.  Patient able to go up/down 3 steps in stairwell having to use bilateral side rails and encouraged to have assistance when going up steps at home with understanding acknowledged for proper step sequence and position of helper.  Patient tolerated sitting up in chair after therapy.  Patient will benefit from continued skilled physical therapy in hospital and recommended venue below to increase strength, balance, endurance for safe ADLs and gait.     Recommendations for follow up therapy are one component of a multi-disciplinary discharge planning process, led by the attending physician.  Recommendations may be updated based on patient status, additional functional criteria and insurance authorization.  Follow Up Recommendations  Home health PT     Assistance Recommended at Discharge Set up Supervision/Assistance  Patient can return home with the following A little help with walking and/or transfers;A little help with bathing/dressing/bathroom;Help with stairs or ramp for entrance;Assistance with cooking/housework   Equipment Recommendations  BSC/3in1;Other (comment) (sock aide reacher)    Recommendations for Other Services       Precautions / Restrictions Precautions Precautions: Fall Restrictions Weight Bearing Restrictions: Yes LLE Weight Bearing: Weight bearing as  tolerated Other Position/Activity Restrictions: On the operated hip: Avoid hyperextension of hip, avoid external rotation, no abduction     Mobility  Bed Mobility Overal bed mobility: Needs Assistance Bed Mobility: Supine to Sit     Supine to sit: Min guard, Min assist     General bed mobility comments: fair/good return for propping up on elbows to hands and able to keep LLE in neutral position with tactile cueing to upper back when scooting to EOB    Transfers Overall transfer level: Needs assistance Equipment used: Rolling walker (2 wheels) Transfers: Sit to/from Stand, Bed to chair/wheelchair/BSC Sit to Stand: Supervision, Modified independent (Device/Increase time)   Step pivot transfers: Supervision, Modified independent (Device/Increase time)       General transfer comment: increased BLE strength for completing sit to stands with good return for proper hand placement    Ambulation/Gait Ambulation/Gait assistance: Modified independent (Device/Increase time), Supervision Gait Distance (Feet): 120 Feet Assistive device: Rolling walker (2 wheels) Gait Pattern/deviations: Decreased step length - right, Decreased step length - left, Decreased stance time - left, Decreased stride length, Antalgic Gait velocity: decreased     General Gait Details: increased endurance/distance for gait training with good return for proper use of RW and left heel to toe stepping without loss of balance, limited mostly due to c/o fatigue and minor increase in left hip pain   Stairs Stairs: Yes Stairs assistance: Min guard Stair Management: Two rails, Step to pattern Number of Stairs: 3 General stair comments: fair/good return for going up/down steps in stairwell using bilateral siderails without loss of balance and understanding acknowledged for proper step sequence,  position of helper   Wheelchair Mobility    Modified Rankin (Stroke Patients Only)  Balance Overall balance  assessment: Needs assistance Sitting-balance support: Feet supported, No upper extremity supported Sitting balance-Leahy Scale: Good Sitting balance - Comments: seated at EOB   Standing balance support: During functional activity, Bilateral upper extremity supported Standing balance-Leahy Scale: Good Standing balance comment: seated at EOB                            Cognition Arousal/Alertness: Awake/alert Behavior During Therapy: WFL for tasks assessed/performed Overall Cognitive Status: Within Functional Limits for tasks assessed                                          Exercises Total Joint Exercises Ankle Circles/Pumps: Supine, 10 reps, Both, Strengthening, AROM Quad Sets: AROM, Strengthening, Both, 10 reps, Supine Gluteal Sets: AROM, Strengthening, Both, 10 reps, Supine Short Arc Quad: Supine, 10 reps, Left, Strengthening, AROM Heel Slides: AROM, Strengthening, Both, 10 reps, Supine    General Comments        Pertinent Vitals/Pain Pain Assessment Pain Score: 3  Pain Location: left hip Pain Descriptors / Indicators: Sore, Discomfort Pain Intervention(s): Limited activity within patient's tolerance, Monitored during session, Premedicated before session, Repositioned    Home Living                          Prior Function            PT Goals (current goals can now be found in the care plan section) Acute Rehab PT Goals Patient Stated Goal: return home with family to assist PT Goal Formulation: With patient Time For Goal Achievement: 04/30/22 Potential to Achieve Goals: Good Progress towards PT goals: Progressing toward goals    Frequency    7X/week      PT Plan Current plan remains appropriate    Co-evaluation              AM-PAC PT "6 Clicks" Mobility   Outcome Measure  Help needed turning from your back to your side while in a flat bed without using bedrails?: A Little Help needed moving from lying on  your back to sitting on the side of a flat bed without using bedrails?: A Little Help needed moving to and from a bed to a chair (including a wheelchair)?: None Help needed standing up from a chair using your arms (e.g., wheelchair or bedside chair)?: A Little Help needed to walk in hospital room?: A Little Help needed climbing 3-5 steps with a railing? : A Little 6 Click Score: 19    End of Session   Activity Tolerance: Patient tolerated treatment well;Patient limited by fatigue Patient left: in chair;with call bell/phone within reach Nurse Communication: Mobility status PT Visit Diagnosis: Unsteadiness on feet (R26.81);Other abnormalities of gait and mobility (R26.89);Muscle weakness (generalized) (M62.81)     Time: 1287-8676 PT Time Calculation (min) (ACUTE ONLY): 33 min  Charges:  $Gait Training: 8-22 mins $Therapeutic Exercise: 8-22 mins                     8:50 AM, 04/28/22 [name], MPT Physical Therapist with [facility] 336 [phone] office [phone] mobile phone

## 2022-04-28 NOTE — Progress Notes (Addendum)
Pt requested PRN oxyCODONE  immediate release tablet 15 mg for pain stated 9 of 10. Pt lying in bed with eyes open. Pt able to make needs known. Will continue to monitor.  Call bell in reach. Slip proof socks on for safety

## 2022-04-28 NOTE — Anesthesia Postprocedure Evaluation (Signed)
Anesthesia Post Note  Patient: [name]  Procedure(s) Performed: TOTAL HIP ARTHROPLASTY (Left: Hip)  Patient location during evaluation: Phase II Anesthesia Type: Spinal Level of consciousness: awake Pain management: pain level controlled Vital Signs Assessment: post-procedure vital signs reviewed and stable Respiratory status: spontaneous breathing and respiratory function stable Cardiovascular status: blood pressure returned to baseline and stable Postop Assessment: no headache and no apparent nausea or vomiting Anesthetic complications: no Comments: Late entry   No notable events documented.   Last Vitals:  Vitals:   04/28/22 0030 04/28/22 0453  BP: 117/68 121/65  Pulse: (!) 59 65  Resp: 19 17  Temp: (!) 36.4 C 36.6 C  SpO2: 99% 100%    Last Pain:  Vitals:   04/28/22 0855  TempSrc:   PainSc: 7                  Windell Norfolk

## 2022-04-29 ENCOUNTER — Telehealth: Payer: Self-pay | Admitting: Orthopedic Surgery

## 2022-04-29 NOTE — Telephone Encounter (Signed)
Patient called, states he came home from [facility] yesterday, said had his hip surgery done 04/27/22, and states he cannot get anything done about the order for the commode he needs at home. Ph [phone]

## 2022-04-29 NOTE — Telephone Encounter (Signed)
He said when he was d/c he was told he will get commode chair, but hasnt gotten it yet, I asked him where order was sent he said not sure  He will give it another day or so if he doesn't hear anything he will let me know

## 2022-05-06 ENCOUNTER — Ambulatory Visit: Payer: Medicare HMO | Admitting: Urology

## 2022-05-11 ENCOUNTER — Ambulatory Visit (HOSPITAL_COMMUNITY): Payer: Medicare HMO | Attending: Orthopedic Surgery

## 2022-05-11 ENCOUNTER — Encounter (HOSPITAL_COMMUNITY): Payer: Self-pay

## 2022-05-11 DIAGNOSIS — Z96642 Presence of left artificial hip joint: Secondary | ICD-10-CM | POA: Diagnosis not present

## 2022-05-11 DIAGNOSIS — M25552 Pain in left hip: Secondary | ICD-10-CM | POA: Diagnosis not present

## 2022-05-11 DIAGNOSIS — M1612 Unilateral primary osteoarthritis, left hip: Secondary | ICD-10-CM | POA: Diagnosis not present

## 2022-05-11 DIAGNOSIS — R262 Difficulty in walking, not elsewhere classified: Secondary | ICD-10-CM | POA: Insufficient documentation

## 2022-05-11 DIAGNOSIS — M25652 Stiffness of left hip, not elsewhere classified: Secondary | ICD-10-CM | POA: Diagnosis not present

## 2022-05-11 NOTE — Therapy (Signed)
OUTPATIENT PHYSICAL THERAPY LOWER EXTREMITY EVALUATION   Patient Name: [name] MRN: [id number] DOB:1956-03-18, 66 y.o., male Today's Date: 05/11/2022   PT End of Session - 05/11/22 1532     Visit Number 1    Number of Visits 19   eval + 3 x 6   Date for PT Re-Evaluation 06/29/22   3x6 weeks, extra week given for mid-week eval and make up time   Authorization Type [employer] - seeking auth, also follow Medicare guidelines    Authorization Time Period seeking, check    Progress Note Due on Visit 10    PT Start Time 0107    PT Stop Time 0147    PT Time Calculation (min) 40 min    Equipment Utilized During Treatment Other (comment)   patient's SPC   Activity Tolerance Patient tolerated treatment well    Behavior During Therapy [facility] for tasks assessed/performed             Past Medical History:  Diagnosis Date   CAD, multiple vessel 10/23/2014   Chronic back pain    Chronic hip pain    Diabetes mellitus    DM2 (diabetes mellitus, type 2) ([place]) 10/23/2014   GERD (gastroesophageal reflux disease)    Heart attack ([employer])    Hyperlipidemia    Hyperlipidemia LDL goal <70 10/23/2014   Hypertension    Tobacco use, stopped 6 months ago  10/23/2014   Past Surgical History:  Procedure Laterality Date   LEFT HEART CATHETERIZATION WITH CORONARY ANGIOGRAM N/A 10/22/2014   Procedure: LEFT HEART CATHETERIZATION WITH CORONARY ANGIOGRAM;  Surgeon: Jettie Booze, MD;  Location: [facility] CATH LAB;  Service: Cardiovascular;  Laterality: N/A;   PERCUTANEOUS CORONARY STENT INTERVENTION (PCI-S)  10/22/2014   Procedure: PERCUTANEOUS CORONARY STENT INTERVENTION (PCI-S);  Surgeon: Jettie Booze, MD;  Location: [facility] CATH LAB;  Service: Cardiovascular;;  x3 to prox RCA , Prox CFX and dist CFX   TOTAL HIP ARTHROPLASTY Right    TOTAL HIP ARTHROPLASTY Left 04/27/2022   Procedure: TOTAL HIP ARTHROPLASTY;  Surgeon: Carole Civil, MD;  Location: [facility]S;  Service: Orthopedics;   Laterality: Left;   Patient Active Problem List   Diagnosis Date Noted   Osteoarthritis of left hip 04/27/2022   Unilateral primary osteoarthritis, left hip    CAD, multiple vessel, s/p DES to pLCX and dLCX, and pRCA 10/22/14 10/23/2014   DM2 (diabetes mellitus, type 2) ([place]) 10/23/2014   Tobacco use, stopped 6 months ago  10/23/2014   GERD (gastroesophageal reflux disease) 10/23/2014   Hyperlipidemia LDL goal <70 10/23/2014   NSTEMI (non-ST elevated myocardial infarction) ([place]) 10/22/2014    PCP: Sharilyn Sites, MD  REFERRING PROVIDER: Carole Civil, MD   REFERRING DIAG: [phone] (ICD-10-CM) - Primary osteoarthritis of left hip  evaluate and treat left hip 3x weekly for 6 weeks needs to begin therapy around 05/10/22 date of surgery 04/27/22   THERAPY DIAG:  Difficulty in walking, not elsewhere classified  S/P hip replacement, left  Pain in left hip  Stiffness of left hip, not elsewhere classified  Rationale for Evaluation and Treatment Rehabilitation  ONSET DATE: 04/27/22 left hip replacement surgery  SUBJECTIVE:   SUBJECTIVE STATEMENT: Surgery for left hip replacement on 04/27/22 after history of left hip arthritis.  Patient reports that things are going better already, had home health come in about 8-9 times to establish a HEP that he reports compliance with.  To MD for follow up tomorrow, Wednesday, for check in and  bandage change.   PERTINENT HISTORY: Arthritis B knees, chronic knee pain, chronic hip pain, right hip replaced over 10 years ago  PAIN:  Are you having pain? Yes: NPRS scale: 2/10 Pain location: left hip incision, B knees achy Pain description: achy Aggravating factors: colder temps, positioning, sleep Relieving factors: aspirin, gaba, one oxy as need , ice after movement as needed  PRECAUTIONS: Posterior hip - patient discusses understanding  WEIGHT BEARING RESTRICTIONS No  FALLS:  Has patient fallen in last 6 months? No  LIVING  ENVIRONMENT: Lives with: lives with their family Lives in: House/apartment Stairs: Yes: External: 2 steps; none Has following equipment at home: Single point cane, Walker - 2 wheeled, Environmental consultant - 4 wheeled, shower chair, and bed side commode  OCCUPATION: retired   PLOF: Independent  PATIENT GOALS "walk without support, help loose weight, go to gym again daily"   OBJECTIVE:   DIAGNOSTIC FINDINGS: DG of pelvis post op with quoted report below =  "FINDINGS: Status post left hip arthroplasty with expected overlying postoperative change. No evidence of perihardware fracture or component malpositioning. Pre-existing right hip arthroplasty."    PATIENT SURVEYS:  FOTO 58  COGNITION:  Overall cognitive status: Within functional limits for tasks assessed     SENSATION: WFL  EDEMA:  Bandage present, patient reports edema, and some fluid/bleeding in region; to be assessed tomorrow at MD  MUSCLE LENGTH:   POSTURE: rounded shoulders, forward head, and weight shift right  PALPATION: NA secondary to surgical dressing in place  LOWER EXTREMITY ROM:  Active ROM Right eval Left eval  Hip flexion    Hip extension    Hip abduction    Hip adduction    Hip internal rotation    Hip external rotation    Knee flexion    Knee extension    Ankle dorsiflexion    Ankle plantarflexion    Ankle inversion    Ankle eversion     (Blank rows = not tested)  LOWER EXTREMITY MMT:  MMT Right eval Left eval  Hip flexion 57 105  Hip extension -4 0  Hip abduction 18 25  Hip adduction 0 10  Hip internal rotation    Hip external rotation    Knee flexion    Knee extension    Ankle dorsiflexion    Ankle plantarflexion    Ankle inversion    Ankle eversion     (Blank rows = not tested)  LOWER EXTREMITY SPECIAL TESTS:  Hip special tests: NT secondary to post op   FUNCTIONAL TESTS:  5 times sit to stand: 12.58 sec from slightly elevated plinth with B UE onto knees, no ADs Timed up and  go (TUG): NT secondary to patient unable to be comfortable in standard chair  2 minute walk test: 220 feet with SPC  GAIT: Distance walked: 220 feet Assistive device utilized: Single point cane Level of assistance: SBA Comments: Equal stride length, slow cadence, minimal weight shift to left stance with cane with mod R UE pressure    TODAY'S TREATMENT: 05/11/22 - evaluation measurements and testing, HEP as below   PATIENT EDUCATION:  Education details: 05/11/22 - evaluation findings, POC, PT scope of practice, post operative healing time frames, ice recovery at home, cont elevation and pain management, HEP as below Person educated: Patient Education method: Explanation, Demonstration, and Handouts Education comprehension: verbalized understanding   HOME EXERCISE PROGRAM: Access Code: [email or web address] URL: [email or web address] Date: 05/11/2022 Prepared by: Lonzo Cloud  Exercises - Standing Heel Raises  -  2 x daily - 7 x weekly - 3 sets - 10 reps - Standing Hip Abduction with Counter Support  - 2 x daily - 7 x weekly - 3 sets - 10 reps - Standing Hip Extension with Counter Support  - 2 x daily - 7 x weekly - 3 sets - 10 reps - Mini Squat with Counter Support  - 2 x daily - 7 x weekly - 3 sets - 10 reps  ASSESSMENT:  CLINICAL IMPRESSION: Patient, Phillip "Baldo Ash", is a 66 y.o. male who was seen today for physical therapy evaluation and treatment for post operative care of his left hip replacement preformed on 04/27/22.  Currently at 2 weeks post operative status, he demonstrates overall good status and function as expected.  He demonstrates limited ROM, limited functional actions, pain and edema also all as expected.  He ambulates with SPC today with equal step length and some avoidance of L LE max weight bearing. He also demonstrates good awareness of post operative hip precautions and overall safety.  Patient is a good candidate to continue with skilled physical therapy to  improve overall strength and ROM while promoting return to prior level of function.    OBJECTIVE IMPAIRMENTS Abnormal gait, decreased activity tolerance, decreased balance, decreased endurance, decreased mobility, difficulty walking, decreased ROM, decreased strength, increased edema, increased fascial restrictions, impaired flexibility, improper body mechanics, and pain.   ACTIVITY LIMITATIONS carrying, lifting, bending, sitting, standing, squatting, sleeping, stairs, bathing, toileting, dressing, and locomotion level  PARTICIPATION LIMITATIONS: meal prep, cleaning, laundry, community activity, and yard work  PERSONAL FACTORS 1-2 comorbidities: DM, B knee arthritis  are also affecting patient's functional outcome.   REHAB POTENTIAL: Good  CLINICAL DECISION MAKING: Stable/uncomplicated  EVALUATION COMPLEXITY: Low   GOALS: Goals reviewed with patient? No  SHORT TERM GOALS: Target date: 06/01/2022  Patient will be independent with initial HEP and self-management strategies to improve functional outcomes Baseline: 05/11/22 - initiated today Goal status: INITIAL  2.  Patient to be able to demonstrate improve L hip AROM to hip flexion max 90 deg for ability to sit into standard chair Baseline: 05/11/22 - 0-57 today Goal status: INITIAL  3.  Patient to be able to demonstrate improved ambulation with no AD, equal WB, and continued equal stride length. Baseline:  Goal status: INITIAL    LONG TERM GOALS: Target date: 06/29/2022   Patient will be independent with advanced HEP and self-management strategies to improve functional outcomes   Baseline: to be established Goal status: INITIAL  2.  Patient will be able to demonstrate improved function in transitions by preforming 5 times sit to stand in less than 11 seconds with no UE use and equal WB from a standard chair Baseline: 05/11/22 - 12.5 sec with max weight on R Le Goal status: INITIAL  3.  Patient to demonstrate improved function  by increasing FOTO functional score to at least 70.  Baseline: 05/11/22 - 58 current Goal status: INITIAL  4.  Patient to be able to ambulate at least 350 feet with no ADs and no pain for improved functional community ambulation.  Baseline: 05/11/22 - 220 feet with SPC Goal status: INITIAL   PLAN: PT FREQUENCY: 3x/week  PT DURATION: 6 weeks  PLANNED INTERVENTIONS: Therapeutic exercises, Therapeutic activity, Neuromuscular re-education, Balance training, Gait training, Patient/Family education, Self Care, Joint mobilization, Stair training, DME instructions, Cryotherapy, scar mobilization, Taping, Manual therapy, and Re-evaluation  PLAN FOR NEXT SESSION: Review goals and HEP, build functional strengthening while improving L hip  ROM and supporting edema control as needed   Jamse Belfast, PT 05/11/2022, 3:37 PM

## 2022-05-12 ENCOUNTER — Encounter: Payer: Self-pay | Admitting: Orthopedic Surgery

## 2022-05-12 ENCOUNTER — Ambulatory Visit (INDEPENDENT_AMBULATORY_CARE_PROVIDER_SITE_OTHER): Payer: Medicare HMO | Admitting: Orthopedic Surgery

## 2022-05-12 DIAGNOSIS — G8918 Other acute postprocedural pain: Secondary | ICD-10-CM

## 2022-05-12 MED ORDER — HYDROCODONE-ACETAMINOPHEN 5-325 MG PO TABS: 1.0000 | ORAL_TABLET | Freq: Every evening | ORAL | 0 refills | Status: AC | PRN

## 2022-05-12 NOTE — Progress Notes (Signed)
Chief Complaint  Patient presents with   Post-op Follow-up    Left hip replacement 04/27/22 improving walking with cane    66 year old male 2 weeks postop left total hip replacement  Direct lateral approach  Modified hard image  Patient is doing well is now walking with a cane his incision looks good his staples were extracted  His leg lengths are equal.  He had a very stiff hip Intra-Op and although I can flex him up to 110 it is very difficult to do.  His rotation seems good he is not having any pain with that.  He says he is off his postop pain medication but would like something to help him sleep at night  We will continue physical therapy and start the following medication   Meds ordered this encounter  Medications   HYDROcodone-acetaminophen (NORCO/VICODIN) 5-325 MG tablet    Sig: Take 1 tablet by mouth at bedtime as needed for moderate pain.    Dispense:  30 tablet    Refill:  0

## 2022-05-13 ENCOUNTER — Ambulatory Visit (HOSPITAL_COMMUNITY): Payer: Medicare HMO | Admitting: Physical Therapy

## 2022-05-13 ENCOUNTER — Ambulatory Visit: Payer: Medicare HMO | Admitting: Urology

## 2022-05-13 DIAGNOSIS — M25652 Stiffness of left hip, not elsewhere classified: Secondary | ICD-10-CM

## 2022-05-13 DIAGNOSIS — R262 Difficulty in walking, not elsewhere classified: Secondary | ICD-10-CM | POA: Diagnosis not present

## 2022-05-13 DIAGNOSIS — M25552 Pain in left hip: Secondary | ICD-10-CM | POA: Diagnosis not present

## 2022-05-13 DIAGNOSIS — Z96642 Presence of left artificial hip joint: Secondary | ICD-10-CM

## 2022-05-13 DIAGNOSIS — M1612 Unilateral primary osteoarthritis, left hip: Secondary | ICD-10-CM | POA: Diagnosis not present

## 2022-05-13 NOTE — Therapy (Signed)
OUTPATIENT PHYSICAL THERAPY TREATMENT NOTE   Patient Name: [name] MRN: [id number] DOB:08-Mar-1956, 66 y.o., male Today's Date: 05/13/2022  PCP: Assunta Found, MD REFERRING PROVIDER: Vickki Hearing, MD   END OF SESSION:   PT End of Session - 05/13/22 1646     Visit Number 2    Number of Visits 19   eval + 3 x 6   Date for PT Re-Evaluation 06/29/22   3x6 weeks, extra week given for mid-week eval and make up time   Authorization Type [employer] - seeking auth, also follow Medicare guidelines    Authorization Time Period seeking, check (still pending)    Progress Note Due on Visit 10    PT Start Time 1644    PT Stop Time 1722    PT Time Calculation (min) 38 min    Equipment Utilized During Treatment Other (comment)   patient's SPC   Activity Tolerance Patient tolerated treatment well    Behavior During Therapy [facility] for tasks assessed/performed             Past Medical History:  Diagnosis Date   CAD, multiple vessel 10/23/2014   Chronic back pain    Chronic hip pain    Diabetes mellitus    DM2 (diabetes mellitus, type 2) ([facility]) 10/23/2014   GERD (gastroesophageal reflux disease)    Heart attack ([facility])    Hyperlipidemia    Hyperlipidemia LDL goal <70 10/23/2014   Hypertension    Tobacco use, stopped 6 months ago  10/23/2014   Past Surgical History:  Procedure Laterality Date   LEFT HEART CATHETERIZATION WITH CORONARY ANGIOGRAM N/A 10/22/2014   Procedure: LEFT HEART CATHETERIZATION WITH CORONARY ANGIOGRAM;  Surgeon: Corky Crafts, MD;  Location: [facility] CATH LAB;  Service: Cardiovascular;  Laterality: N/A;   PERCUTANEOUS CORONARY STENT INTERVENTION (PCI-S)  10/22/2014   Procedure: PERCUTANEOUS CORONARY STENT INTERVENTION (PCI-S);  Surgeon: Corky Crafts, MD;  Location: [facility] CATH LAB;  Service: Cardiovascular;;  x3 to prox RCA , Prox CFX and dist CFX   TOTAL HIP ARTHROPLASTY Right    TOTAL HIP ARTHROPLASTY Left 04/27/2022   Procedure: TOTAL HIP  ARTHROPLASTY;  Surgeon: Vickki Hearing, MD;  Location: [facility];  Service: Orthopedics;  Laterality: Left;   Patient Active Problem List   Diagnosis Date Noted   Osteoarthritis of left hip 04/27/2022   Unilateral primary osteoarthritis, left hip    CAD, multiple vessel, s/p DES to pLCX and dLCX, and pRCA 10/22/14 10/23/2014   DM2 (diabetes mellitus, type 2) ([facility]) 10/23/2014   Tobacco use, stopped 6 months ago  10/23/2014   GERD (gastroesophageal reflux disease) 10/23/2014   Hyperlipidemia LDL goal <70 10/23/2014   NSTEMI (non-ST elevated myocardial infarction) ([facility]) 10/22/2014    REFERRING DIAG: W23.76 (ICD-10-CM) - Primary osteoarthritis of left hip  evaluate and treat left hip 3x weekly for 6 weeks needs to begin therapy around 05/10/22 date of surgery 04/27/22   THERAPY DIAG:  Difficulty in walking, not elsewhere classified  S/P hip replacement, left  Pain in left hip  Stiffness of left hip, not elsewhere classified  Rationale for Evaluation and Treatment Rehabilitation  PERTINENT HISTORY: Arthritis B knees, chronic knee pain, chronic hip pain, right hip replaced over 10 years ago  PRECAUTIONS: Posterior hip - patient discusses understanding  SUBJECTIVE: Patient doing well. HEP going well. Pain well managed except today bumped his LT leg on curb, feeling a little more sore at the moment.   PAIN:  Are you having  pain? Yes: NPRS scale: 4/10 Pain location: left hip incision, B knees achy Pain description: achy Aggravating factors: colder temps, positioning, sleep Relieving factors: aspirin, gaba, one oxy as need , ice after movement as needed  OBJECTIVE:    DIAGNOSTIC FINDINGS: DG of pelvis post op with quoted report below =  "FINDINGS: Status post left hip arthroplasty with expected overlying postoperative change. No evidence of perihardware fracture or component malpositioning. Pre-existing right hip arthroplasty."     PATIENT SURVEYS:  FOTO 58   COGNITION:            Overall cognitive status: Within functional limits for tasks assessed                          SENSATION: WFL   EDEMA:  Bandage present, patient reports edema, and some fluid/bleeding in region; to be assessed tomorrow at MD   MUSCLE LENGTH:     POSTURE: rounded shoulders, forward head, and weight shift right   PALPATION: NA secondary to surgical dressing in place   LOWER EXTREMITY ROM:   Active ROM Right eval Left eval  Hip flexion      Hip extension      Hip abduction      Hip adduction      Hip internal rotation      Hip external rotation      Knee flexion      Knee extension      Ankle dorsiflexion      Ankle plantarflexion      Ankle inversion      Ankle eversion       (Blank rows = not tested)   LOWER EXTREMITY MMT:   MMT Right eval Left eval  Hip flexion 57 105  Hip extension -4 0  Hip abduction 18 25  Hip adduction 0 10  Hip internal rotation      Hip external rotation      Knee flexion      Knee extension      Ankle dorsiflexion      Ankle plantarflexion      Ankle inversion      Ankle eversion       (Blank rows = not tested)   LOWER EXTREMITY SPECIAL TESTS:  Hip special tests: NT secondary to post op    FUNCTIONAL TESTS:  5 times sit to stand: 12.58 sec from slightly elevated plinth with B UE onto knees, no ADs Timed up and go (TUG): NT secondary to patient unable to be comfortable in standard chair  2 minute walk test: 220 feet with SPC   GAIT: Distance walked: 220 feet Assistive device utilized: Single point cane Level of assistance: SBA Comments: Equal stride length, slow cadence, minimal weight shift to left stance with cane with mod R UE pressure       TODAY'S TREATMENT: 05/13/22 Goal review Quad set 10 x 5" SLR 2 x10  Heel slide 10 x 5" Glute set 10 x 5"   Heel raise 2 x 10  Standing hip extension 2 x 10  Standing hip abduction 2 x10 Mini squat counter support 2 x 10 Standing knee flexion 2 x 10 Clinic ambulation  2RT with [facility]     05/11/22 - evaluation measurements and testing, HEP as below     PATIENT EDUCATION:  Education details: 05/11/22 - evaluation findings, POC, PT scope of practice, post operative healing time frames, ice recovery at home, cont elevation and pain management,  HEP as below Person educated: Patient Education method: Explanation, Demonstration, and Handouts Education comprehension: verbalized understanding     HOME EXERCISE PROGRAM: Access Code: [email or web address] URL: [email or web address] Date: 05/11/2022 Prepared by: Lonzo Cloud   Exercises - Standing Heel Raises  - 2 x daily - 7 x weekly - 3 sets - 10 reps - Standing Hip Abduction with Counter Support  - 2 x daily - 7 x weekly - 3 sets - 10 reps - Standing Hip Extension with Counter Support  - 2 x daily - 7 x weekly - 3 sets - 10 reps - Mini Squat with Counter Support  - 2 x daily - 7 x weekly - 3 sets - 10 reps   ASSESSMENT:   CLINICAL IMPRESSION: Patient tolerated session well today. Reviewed therapy goals and HEP. Added supine isometrics for glute and quad strengthening. Patient noting some discomfort in LT thigh with mini squats with counter support, but improved with verbal cues for form. Patient shows good quad function and improving hip pain free AROM. Patient will continue to benefit from skilled therapy services to reduce remaining deficits and improve functional ability.       OBJECTIVE IMPAIRMENTS Abnormal gait, decreased activity tolerance, decreased balance, decreased endurance, decreased mobility, difficulty walking, decreased ROM, decreased strength, increased edema, increased fascial restrictions, impaired flexibility, improper body mechanics, and pain.    ACTIVITY LIMITATIONS carrying, lifting, bending, sitting, standing, squatting, sleeping, stairs, bathing, toileting, dressing, and locomotion level   PARTICIPATION LIMITATIONS: meal prep, cleaning, laundry, community activity, and yard work    PERSONAL FACTORS 1-2 comorbidities: DM, B knee arthritis  are also affecting patient's functional outcome.    REHAB POTENTIAL: Good   CLINICAL DECISION MAKING: Stable/uncomplicated   EVALUATION COMPLEXITY: Low     GOALS: Goals reviewed with patient? No   SHORT TERM GOALS: Target date: 06/01/2022  Patient will be independent with initial HEP and self-management strategies to improve functional outcomes Baseline: 05/11/22 - initiated today Goal status: INITIAL   2.  Patient to be able to demonstrate improve L hip AROM to hip flexion max 90 deg for ability to sit into standard chair Baseline: 05/11/22 - 0-57 today Goal status: INITIAL   3.  Patient to be able to demonstrate improved ambulation with no AD, equal WB, and continued equal stride length. Baseline:  Goal status: INITIAL       LONG TERM GOALS: Target date: 06/29/2022    Patient will be independent with advanced HEP and self-management strategies to improve functional outcomes   Baseline: to be established Goal status: INITIAL   2.  Patient will be able to demonstrate improved function in transitions by preforming 5 times sit to stand in less than 11 seconds with no UE use and equal WB from a standard chair Baseline: 05/11/22 - 12.5 sec with max weight on R Le Goal status: INITIAL   3.  Patient to demonstrate improved function by increasing FOTO functional score to at least 70.  Baseline: 05/11/22 - 58 current Goal status: INITIAL   4.  Patient to be able to ambulate at least 350 feet with no ADs and no pain for improved functional community ambulation.  Baseline: 05/11/22 - 220 feet with SPC Goal status: INITIAL     PLAN: PT FREQUENCY: 3x/week   PT DURATION: 6 weeks   PLANNED INTERVENTIONS: Therapeutic exercises, Therapeutic activity, Neuromuscular re-education, Balance training, Gait training, Patient/Family education, Self Care, Joint mobilization, Stair training, DME instructions, Cryotherapy, scar mobilization,  Taping, Manual therapy, and Re-evaluation  PLAN FOR NEXT SESSION: Build functional strengthening while improving L hip ROM and supporting edema control as needed     5:25 PM, 05/13/22 Georges Lynch PT DPT   [facility]  [phone]

## 2022-05-17 ENCOUNTER — Ambulatory Visit (HOSPITAL_COMMUNITY): Payer: Medicare HMO | Admitting: Physical Therapy

## 2022-05-17 DIAGNOSIS — M25652 Stiffness of left hip, not elsewhere classified: Secondary | ICD-10-CM | POA: Diagnosis not present

## 2022-05-17 DIAGNOSIS — Z96642 Presence of left artificial hip joint: Secondary | ICD-10-CM | POA: Diagnosis not present

## 2022-05-17 DIAGNOSIS — R262 Difficulty in walking, not elsewhere classified: Secondary | ICD-10-CM | POA: Diagnosis not present

## 2022-05-17 DIAGNOSIS — M1612 Unilateral primary osteoarthritis, left hip: Secondary | ICD-10-CM | POA: Diagnosis not present

## 2022-05-17 DIAGNOSIS — M25552 Pain in left hip: Secondary | ICD-10-CM

## 2022-05-17 NOTE — Therapy (Signed)
OUTPATIENT PHYSICAL THERAPY TREATMENT NOTE   Patient Name: [name] MRN: [id number] DOB:[birth year], 66 y.o., male Today's Date: 05/17/2022  PCP: Assunta Found, MD REFERRING PROVIDER: Vickki Hearing, MD   END OF SESSION:   PT End of Session - 05/17/22 1515     Visit Number 3    Number of Visits 19   eval + 3 x 6   Date for PT Re-Evaluation 06/29/22   3x6 weeks, extra week given for mid-week eval and make up time   Authorization Type [employer] - seeking auth, also follow Medicare guidelines    Authorization Time Period 12 approved 8/17-10/3/23    Authorization - Visit Number 2    Authorization - Number of Visits 12    Progress Note Due on Visit 10    PT Start Time 1516    PT Stop Time 1554    PT Time Calculation (min) 38 min    Equipment Utilized During Treatment Other (comment)   patient's SPC   Activity Tolerance Patient tolerated treatment well    Behavior During Therapy [facility] for tasks assessed/performed             Past Medical History:  Diagnosis Date   CAD, multiple vessel 10/23/2014   Chronic back pain    Chronic hip pain    Diabetes mellitus    DM2 (diabetes mellitus, type 2) (HCC) 10/23/2014   GERD (gastroesophageal reflux disease)    Heart attack (HCC)    Hyperlipidemia    Hyperlipidemia LDL goal <70 10/23/2014   Hypertension    Tobacco use, stopped 6 months ago  10/23/2014   Past Surgical History:  Procedure Laterality Date   LEFT HEART CATHETERIZATION WITH CORONARY ANGIOGRAM N/A 10/22/2014   Procedure: LEFT HEART CATHETERIZATION WITH CORONARY ANGIOGRAM;  Surgeon: Corky Crafts, MD;  Location: [facility] CATH LAB;  Service: Cardiovascular;  Laterality: N/A;   PERCUTANEOUS CORONARY STENT INTERVENTION (PCI-S)  10/22/2014   Procedure: PERCUTANEOUS CORONARY STENT INTERVENTION (PCI-S);  Surgeon: Corky Crafts, MD;  Location: [facility] CATH LAB;  Service: Cardiovascular;;  x3 to prox RCA , Prox CFX and dist CFX   TOTAL HIP ARTHROPLASTY Right     TOTAL HIP ARTHROPLASTY Left 04/27/2022   Procedure: TOTAL HIP ARTHROPLASTY;  Surgeon: Vickki Hearing, MD;  Location: [facility] ORS;  Service: Orthopedics;  Laterality: Left;   Patient Active Problem List   Diagnosis Date Noted   Osteoarthritis of left hip 04/27/2022   Unilateral primary osteoarthritis, left hip    CAD, multiple vessel, s/p DES to pLCX and dLCX, and pRCA 10/22/14 10/23/2014   DM2 (diabetes mellitus, type 2) (HCC) 10/23/2014   Tobacco use, stopped 6 months ago  10/23/2014   GERD (gastroesophageal reflux disease) 10/23/2014   Hyperlipidemia LDL goal <70 10/23/2014   NSTEMI (non-ST elevated myocardial infarction) (HCC) 10/22/2014    REFERRING DIAG: S06.30 (ICD-10-CM) - Primary osteoarthritis of left hip  evaluate and treat left hip 3x weekly for 6 weeks needs to begin therapy around 05/10/22 date of surgery 04/27/22   THERAPY DIAG:  Difficulty in walking, not elsewhere classified  S/P hip replacement, left  Pain in left hip  Stiffness of left hip, not elsewhere classified  Rationale for Evaluation and Treatment Rehabilitation  PERTINENT HISTORY: Arthritis B knees, chronic knee pain, chronic hip pain, right hip replaced over 10 years ago  PRECAUTIONS: Posterior hip - patient discusses understanding  SUBJECTIVE: Patient doing well. Still having some numbness in leg depending on activity. Doing HEP without issues.  PAIN:  Are you having pain? Yes: NPRS scale: 1/10 Pain location: left hip incision, B knees achy Pain description: achy Aggravating factors: colder temps, positioning, sleep Relieving factors: aspirin, gaba, one oxy as need , ice after movement as needed  OBJECTIVE:    DIAGNOSTIC FINDINGS: DG of pelvis post op with quoted report below =  "FINDINGS: Status post left hip arthroplasty with expected overlying postoperative change. No evidence of perihardware fracture or component malpositioning. Pre-existing right hip arthroplasty."     PATIENT SURVEYS:   FOTO 58   COGNITION:           Overall cognitive status: Within functional limits for tasks assessed                          SENSATION: WFL   EDEMA:  Bandage present, patient reports edema, and some fluid/bleeding in region; to be assessed tomorrow at MD   MUSCLE LENGTH:     POSTURE: rounded shoulders, forward head, and weight shift right   PALPATION: NA secondary to surgical dressing in place   LOWER EXTREMITY ROM:   Active ROM Right eval Left eval  Hip flexion      Hip extension      Hip abduction      Hip adduction      Hip internal rotation      Hip external rotation      Knee flexion      Knee extension      Ankle dorsiflexion      Ankle plantarflexion      Ankle inversion      Ankle eversion       (Blank rows = not tested)   LOWER EXTREMITY MMT:   MMT Right eval Left eval  Hip flexion 57 105  Hip extension -4 0  Hip abduction 18 25  Hip adduction 0 10  Hip internal rotation      Hip external rotation      Knee flexion      Knee extension      Ankle dorsiflexion      Ankle plantarflexion      Ankle inversion      Ankle eversion       (Blank rows = not tested)   LOWER EXTREMITY SPECIAL TESTS:  Hip special tests: NT secondary to post op    FUNCTIONAL TESTS:  5 times sit to stand: 12.58 sec from slightly elevated plinth with B UE onto knees, no ADs Timed up and go (TUG): NT secondary to patient unable to be comfortable in standard chair  2 minute walk test: 220 feet with SPC   GAIT: Distance walked: 220 feet Assistive device utilized: Single point cane Level of assistance: SBA Comments: Equal stride length, slow cadence, minimal weight shift to left stance with cane with mod R UE pressure       TODAY'S TREATMENT: 05/17/22 Nu step (seat 12) lv 2 6 min  Heel raise 2 x 10  Standing hip flexion 2 x 10  Standing hip extension 2 x 10  Standing hip abduction 2 x10 Step up 4 inch 2 x10 Lateral step up 4 inch 2 x 10  Semi tandem stance 2 x  30" Mini squat counter support 2 x 10 Standing knee flexion 2 x 10 Clinic ambulation 2RT with SPC  Single leg stance with HHA x 1 support 5 x 10"    05/13/22 Goal review Quad set 10 x 5" SLR 2 x10  Heel slide 10 x 5" Glute set 10 x 5"   Heel raise 2 x 10  Standing hip extension 2 x 10  Standing hip abduction 2 x10 Mini squat counter support 2 x 10 Standing knee flexion 2 x 10 Clinic ambulation 2RT with [facility]     05/11/22 - evaluation measurements and testing, HEP as below     PATIENT EDUCATION:  Education details: 05/11/22 - evaluation findings, POC, PT scope of practice, post operative healing time frames, ice recovery at home, cont elevation and pain management, HEP as below Person educated: Patient Education method: Explanation, Demonstration, and Handouts Education comprehension: verbalized understanding     HOME EXERCISE PROGRAM: Access Code: [email or web address] URL: [email or web address]  05/17/22 - Standing Knee Flexion with Unilateral Counter Support  - 2 x daily - 7 x weekly - 2 sets - 10 reps - Standing Single Leg Stance with Counter Support  - 2 x daily - 7 x weekly - 1 sets - 5 reps - 10 second hold Date: 05/11/2022 Prepared by: Lonzo Cloud   Exercises - Standing Heel Raises  - 2 x daily - 7 x weekly - 3 sets - 10 reps - Standing Hip Abduction with Counter Support  - 2 x daily - 7 x weekly - 3 sets - 10 reps - Standing Hip Extension with Counter Support  - 2 x daily - 7 x weekly - 3 sets - 10 reps - Mini Squat with Counter Support  - 2 x daily - 7 x weekly - 3 sets - 10 reps   ASSESSMENT:   CLINICAL IMPRESSION: Patient tolerated session well today . Progressed LE strengthening with added step ups and also progressed to static balance activity. Patient demos good standing balance, but not quite able to stand on single leg without support as of yet due to weakness. Patient will continue to benefit from skilled therapy services to reduce remaining deficits  and improve functional ability.     OBJECTIVE IMPAIRMENTS Abnormal gait, decreased activity tolerance, decreased balance, decreased endurance, decreased mobility, difficulty walking, decreased ROM, decreased strength, increased edema, increased fascial restrictions, impaired flexibility, improper body mechanics, and pain.    ACTIVITY LIMITATIONS carrying, lifting, bending, sitting, standing, squatting, sleeping, stairs, bathing, toileting, dressing, and locomotion level   PARTICIPATION LIMITATIONS: meal prep, cleaning, laundry, community activity, and yard work   PERSONAL FACTORS 1-2 comorbidities: DM, B knee arthritis  are also affecting patient's functional outcome.    REHAB POTENTIAL: Good   CLINICAL DECISION MAKING: Stable/uncomplicated   EVALUATION COMPLEXITY: Low     GOALS: Goals reviewed with patient? No   SHORT TERM GOALS: Target date: 06/01/2022  Patient will be independent with initial HEP and self-management strategies to improve functional outcomes Baseline: 05/11/22 - initiated today Goal status: INITIAL   2.  Patient to be able to demonstrate improve L hip AROM to hip flexion max 90 deg for ability to sit into standard chair Baseline: 05/11/22 - 0-57 today Goal status: INITIAL   3.  Patient to be able to demonstrate improved ambulation with no AD, equal WB, and continued equal stride length. Baseline:  Goal status: INITIAL       LONG TERM GOALS: Target date: 06/29/2022    Patient will be independent with advanced HEP and self-management strategies to improve functional outcomes   Baseline: to be established Goal status: INITIAL   2.  Patient will be able to demonstrate improved function in transitions by preforming 5 times sit to stand in less than 11  seconds with no UE use and equal WB from a standard chair Baseline: 05/11/22 - 12.5 sec with max weight on R Le Goal status: INITIAL   3.  Patient to demonstrate improved function by increasing FOTO functional score  to at least 70.  Baseline: 05/11/22 - 58 current Goal status: INITIAL   4.  Patient to be able to ambulate at least 350 feet with no ADs and no pain for improved functional community ambulation.  Baseline: 05/11/22 - 220 feet with SPC Goal status: INITIAL     PLAN: PT FREQUENCY: 3x/week   PT DURATION: 6 weeks   PLANNED INTERVENTIONS: Therapeutic exercises, Therapeutic activity, Neuromuscular re-education, Balance training, Gait training, Patient/Family education, Self Care, Joint mobilization, Stair training, DME instructions, Cryotherapy, scar mobilization, Taping, Manual therapy, and Re-evaluation   PLAN FOR NEXT SESSION: Build functional strengthening while improving L hip ROM and supporting edema control as needed     3:55 PM, 05/17/22 Georges Lynch PT DPT   [facility]  [phone]

## 2022-05-19 ENCOUNTER — Ambulatory Visit (HOSPITAL_COMMUNITY): Payer: Medicare HMO | Admitting: Physical Therapy

## 2022-05-19 ENCOUNTER — Encounter (HOSPITAL_COMMUNITY): Payer: Self-pay | Admitting: Physical Therapy

## 2022-05-19 DIAGNOSIS — M1612 Unilateral primary osteoarthritis, left hip: Secondary | ICD-10-CM | POA: Diagnosis not present

## 2022-05-19 DIAGNOSIS — Z96642 Presence of left artificial hip joint: Secondary | ICD-10-CM

## 2022-05-19 DIAGNOSIS — R262 Difficulty in walking, not elsewhere classified: Secondary | ICD-10-CM

## 2022-05-19 DIAGNOSIS — M25552 Pain in left hip: Secondary | ICD-10-CM | POA: Diagnosis not present

## 2022-05-19 DIAGNOSIS — M25652 Stiffness of left hip, not elsewhere classified: Secondary | ICD-10-CM

## 2022-05-19 NOTE — Therapy (Signed)
OUTPATIENT PHYSICAL THERAPY TREATMENT NOTE   Patient Name: [name] MRN: [id number] DOB:27-Jun-1956, 66 y.o., male Today's Date: 05/19/2022  PCP: Assunta Found, MD REFERRING PROVIDER: Vickki Hearing, MD   END OF SESSION:   PT End of Session - 05/19/22 1436     Visit Number 4    Number of Visits 19   eval + 3 x 6   Date for PT Re-Evaluation 06/29/22   3x6 weeks, extra week given for mid-week eval and make up time   Authorization Type [employer]    Authorization Time Period 12 approved 8/17-10/3/23    Authorization - Visit Number 3    Authorization - Number of Visits 12    Progress Note Due on Visit 10    PT Start Time 1432    PT Stop Time 1510    PT Time Calculation (min) 38 min    Equipment Utilized During Treatment Other (comment)   patient's SPC   Activity Tolerance Patient tolerated treatment well    Behavior During Therapy [facility] for tasks assessed/performed             Past Medical History:  Diagnosis Date   CAD, multiple vessel 10/23/2014   Chronic back pain    Chronic hip pain    Diabetes mellitus    DM2 (diabetes mellitus, type 2) (HCC) 10/23/2014   GERD (gastroesophageal reflux disease)    Heart attack (HCC)    Hyperlipidemia    Hyperlipidemia LDL goal <70 10/23/2014   Hypertension    Tobacco use, stopped 6 months ago  10/23/2014   Past Surgical History:  Procedure Laterality Date   LEFT HEART CATHETERIZATION WITH CORONARY ANGIOGRAM N/A 10/22/2014   Procedure: LEFT HEART CATHETERIZATION WITH CORONARY ANGIOGRAM;  Surgeon: Corky Crafts, MD;  Location: [facility] CATH LAB;  Service: Cardiovascular;  Laterality: N/A;   PERCUTANEOUS CORONARY STENT INTERVENTION (PCI-S)  10/22/2014   Procedure: PERCUTANEOUS CORONARY STENT INTERVENTION (PCI-S);  Surgeon: Corky Crafts, MD;  Location: [facility] CATH LAB;  Service: Cardiovascular;;  x3 to prox RCA , Prox CFX and dist CFX   TOTAL HIP ARTHROPLASTY Right    TOTAL HIP ARTHROPLASTY Left 04/27/2022    Procedure: TOTAL HIP ARTHROPLASTY;  Surgeon: Vickki Hearing, MD;  Location: [facility] ORS;  Service: Orthopedics;  Laterality: Left;   Patient Active Problem List   Diagnosis Date Noted   Osteoarthritis of left hip 04/27/2022   Unilateral primary osteoarthritis, left hip    CAD, multiple vessel, s/p DES to pLCX and dLCX, and pRCA 10/22/14 10/23/2014   DM2 (diabetes mellitus, type 2) (HCC) 10/23/2014   Tobacco use, stopped 6 months ago  10/23/2014   GERD (gastroesophageal reflux disease) 10/23/2014   Hyperlipidemia LDL goal <70 10/23/2014   NSTEMI (non-ST elevated myocardial infarction) (HCC) 10/22/2014    REFERRING DIAG: L87.56 (ICD-10-CM) - Primary osteoarthritis of left hip  evaluate and treat left hip 3x weekly for 6 weeks needs to begin therapy around 05/10/22 date of surgery 04/27/22   THERAPY DIAG:  Difficulty in walking, not elsewhere classified  S/P hip replacement, left  Pain in left hip  Stiffness of left hip, not elsewhere classified  Rationale for Evaluation and Treatment Rehabilitation  PERTINENT HISTORY: Arthritis B knees, chronic knee pain, chronic hip pain, right hip replaced over 10 years ago  PRECAUTIONS: Posterior hip - patient discusses understanding  SUBJECTIVE: Patient doing well. Compliant with HEP. Went for a walk today, about 100 yards.   PAIN:  Are you having pain? Yes:  NPRS scale: 2/10 Pain location: left hip incision, B knees achy Pain description: achy Aggravating factors: colder temps, positioning, sleep Relieving factors: aspirin, gaba, one oxy as need , ice after movement as needed  OBJECTIVE:    DIAGNOSTIC FINDINGS: DG of pelvis post op with quoted report below =  "FINDINGS: Status post left hip arthroplasty with expected overlying postoperative change. No evidence of perihardware fracture or component malpositioning. Pre-existing right hip arthroplasty."     PATIENT SURVEYS:  FOTO 58   COGNITION:           Overall cognitive status:  Within functional limits for tasks assessed                          SENSATION: WFL   EDEMA:  Bandage present, patient reports edema, and some fluid/bleeding in region; to be assessed tomorrow at MD   MUSCLE LENGTH:     POSTURE: rounded shoulders, forward head, and weight shift right   PALPATION: NA secondary to surgical dressing in place   LOWER EXTREMITY ROM:   Active ROM Right eval Left eval  Hip flexion      Hip extension      Hip abduction      Hip adduction      Hip internal rotation      Hip external rotation      Knee flexion      Knee extension      Ankle dorsiflexion      Ankle plantarflexion      Ankle inversion      Ankle eversion       (Blank rows = not tested)   LOWER EXTREMITY MMT:   MMT Right eval Left eval  Hip flexion 57 105  Hip extension -4 0  Hip abduction 18 25  Hip adduction 0 10  Hip internal rotation      Hip external rotation      Knee flexion      Knee extension      Ankle dorsiflexion      Ankle plantarflexion      Ankle inversion      Ankle eversion       (Blank rows = not tested)   LOWER EXTREMITY SPECIAL TESTS:  Hip special tests: NT secondary to post op    FUNCTIONAL TESTS:  5 times sit to stand: 12.58 sec from slightly elevated plinth with B UE onto knees, no ADs Timed up and go (TUG): NT secondary to patient unable to be comfortable in standard chair  2 minute walk test: 220 feet with SPC   GAIT: Distance walked: 220 feet Assistive device utilized: Single point cane Level of assistance: SBA Comments: Equal stride length, slow cadence, minimal weight shift to left stance with cane with mod R UE pressure       TODAY'S TREATMENT: 05/19/22 Nu step (seat 11) lv 2 6 min  Heel raise 2 x 10  Standing hip flexion 2 x 10 with 2lb   Standing hip extension 2 x 10 with 2lb  Standing hip abduction 2 x10with 2lb  Step up 6 inch 2 x10 HHA x 1 Semi tandem stance on foam 2 x 30" Mini squat at bars 2 x 10 Standing knee  flexion 2 x 10 with 2lb   Clinic ambulation 1RT with SPC  Single leg stance with HHA x 1 support 5 x 10"   05/17/22 Nu step (seat 12) lv 2 6 min  Heel raise  2 x 10  Standing hip flexion 2 x 10  Standing hip extension 2 x 10  Standing hip abduction 2 x10 Step up 4 inch 2 x10 Lateral step up 4 inch 2 x 10  Semi tandem stance 2 x 30" Mini squat counter support 2 x 10 Standing knee flexion 2 x 10 Clinic ambulation 2RT with SPC  Single leg stance with HHA x 1 support 5 x 10"    05/13/22 Goal review Quad set 10 x 5" SLR 2 x10  Heel slide 10 x 5" Glute set 10 x 5"   Heel raise 2 x 10  Standing hip extension 2 x 10  Standing hip abduction 2 x10 Mini squat counter support 2 x 10 Standing knee flexion 2 x 10 Clinic ambulation 2RT with [facility]     05/11/22 - evaluation measurements and testing, HEP as below     PATIENT EDUCATION:  Education details: 05/11/22 - evaluation findings, POC, PT scope of practice, post operative healing time frames, ice recovery at home, cont elevation and pain management, HEP as below Person educated: Patient Education method: Explanation, Demonstration, and Handouts Education comprehension: verbalized understanding     HOME EXERCISE PROGRAM: Access Code: [email or web address] URL: [email or web address]  05/17/22 - Standing Knee Flexion with Unilateral Counter Support  - 2 x daily - 7 x weekly - 2 sets - 10 reps - Standing Single Leg Stance with Counter Support  - 2 x daily - 7 x weekly - 1 sets - 5 reps - 10 second hold Date: 05/11/2022 Prepared by: Lonzo Cloud   Exercises - Standing Heel Raises  - 2 x daily - 7 x weekly - 3 sets - 10 reps - Standing Hip Abduction with Counter Support  - 2 x daily - 7 x weekly - 3 sets - 10 reps - Standing Hip Extension with Counter Support  - 2 x daily - 7 x weekly - 3 sets - 10 reps - Mini Squat with Counter Support  - 2 x daily - 7 x weekly - 3 sets - 10 reps   ASSESSMENT:   CLINICAL  IMPRESSION: Progressed to 6 inch box. Added foam to tandem stance for balance. Patient tolerated activity well today. Also added ankle weights for strengthening. Patient showing good effort today, visibly perspiring but reports no increased complaint of pain. Patient will continue to benefit from skilled therapy services to reduce remaining deficits and improve functional ability.     OBJECTIVE IMPAIRMENTS Abnormal gait, decreased activity tolerance, decreased balance, decreased endurance, decreased mobility, difficulty walking, decreased ROM, decreased strength, increased edema, increased fascial restrictions, impaired flexibility, improper body mechanics, and pain.    ACTIVITY LIMITATIONS carrying, lifting, bending, sitting, standing, squatting, sleeping, stairs, bathing, toileting, dressing, and locomotion level   PARTICIPATION LIMITATIONS: meal prep, cleaning, laundry, community activity, and yard work   PERSONAL FACTORS 1-2 comorbidities: DM, B knee arthritis  are also affecting patient's functional outcome.    REHAB POTENTIAL: Good   CLINICAL DECISION MAKING: Stable/uncomplicated   EVALUATION COMPLEXITY: Low     GOALS: Goals reviewed with patient? No   SHORT TERM GOALS: Target date: 06/01/2022  Patient will be independent with initial HEP and self-management strategies to improve functional outcomes Baseline: 05/11/22 - initiated today Goal status: INITIAL   2.  Patient to be able to demonstrate improve L hip AROM to hip flexion max 90 deg for ability to sit into standard chair Baseline: 05/11/22 - 0-57 today Goal status: INITIAL   3.  Patient to  be able to demonstrate improved ambulation with no AD, equal WB, and continued equal stride length. Baseline:  Goal status: INITIAL       LONG TERM GOALS: Target date: 06/29/2022    Patient will be independent with advanced HEP and self-management strategies to improve functional outcomes   Baseline: to be established Goal status:  INITIAL   2.  Patient will be able to demonstrate improved function in transitions by preforming 5 times sit to stand in less than 11 seconds with no UE use and equal WB from a standard chair Baseline: 05/11/22 - 12.5 sec with max weight on R Le Goal status: INITIAL   3.  Patient to demonstrate improved function by increasing FOTO functional score to at least 70.  Baseline: 05/11/22 - 58 current Goal status: INITIAL   4.  Patient to be able to ambulate at least 350 feet with no ADs and no pain for improved functional community ambulation.  Baseline: 05/11/22 - 220 feet with SPC Goal status: INITIAL     PLAN: PT FREQUENCY: 3x/week   PT DURATION: 6 weeks   PLANNED INTERVENTIONS: Therapeutic exercises, Therapeutic activity, Neuromuscular re-education, Balance training, Gait training, Patient/Family education, Self Care, Joint mobilization, Stair training, DME instructions, Cryotherapy, scar mobilization, Taping, Manual therapy, and Re-evaluation   PLAN FOR NEXT SESSION: Build functional strengthening while improving L hip ROM and supporting edema control as needed     3:09 PM, 05/19/22 Georges Lynch PT DPT   [facility]  [phone]

## 2022-05-21 ENCOUNTER — Ambulatory Visit (HOSPITAL_COMMUNITY): Payer: Medicare HMO

## 2022-05-21 DIAGNOSIS — M25552 Pain in left hip: Secondary | ICD-10-CM | POA: Diagnosis not present

## 2022-05-21 DIAGNOSIS — R262 Difficulty in walking, not elsewhere classified: Secondary | ICD-10-CM | POA: Diagnosis not present

## 2022-05-21 DIAGNOSIS — M25652 Stiffness of left hip, not elsewhere classified: Secondary | ICD-10-CM

## 2022-05-21 DIAGNOSIS — M1612 Unilateral primary osteoarthritis, left hip: Secondary | ICD-10-CM | POA: Diagnosis not present

## 2022-05-21 DIAGNOSIS — Z96642 Presence of left artificial hip joint: Secondary | ICD-10-CM | POA: Diagnosis not present

## 2022-05-21 NOTE — Therapy (Signed)
OUTPATIENT PHYSICAL THERAPY TREATMENT NOTE   Patient Name: [name] MRN: [id number] DOB:1956/08/18, 66 y.o., male Today's Date: 05/21/2022  PCP: Assunta Found, MD REFERRING PROVIDER: Vickki Hearing, MD   END OF SESSION:   PT End of Session - 05/21/22 0855     Visit Number 5    Number of Visits 19   eval + 3 x 6   Date for PT Re-Evaluation 06/29/22   3x6 weeks, extra week given for mid-week eval and make up time   Authorization Type [employer]    Authorization Time Period 12 approved 8/17-10/3/23    Authorization - Visit Number 4    Authorization - Number of Visits 12    Progress Note Due on Visit 10    PT Start Time 0854    PT Stop Time 0940    PT Time Calculation (min) 46 min    Equipment Utilized During Treatment Other (comment)   patient's SPC   Activity Tolerance Patient tolerated treatment well    Behavior During Therapy [facility] for tasks assessed/performed              Past Medical History:  Diagnosis Date   CAD, multiple vessel 10/23/2014   Chronic back pain    Chronic hip pain    Diabetes mellitus    DM2 (diabetes mellitus, type 2) (HCC) 10/23/2014   GERD (gastroesophageal reflux disease)    Heart attack (HCC)    Hyperlipidemia    Hyperlipidemia LDL goal <70 10/23/2014   Hypertension    Tobacco use, stopped 6 months ago  10/23/2014   Past Surgical History:  Procedure Laterality Date   LEFT HEART CATHETERIZATION WITH CORONARY ANGIOGRAM N/A 10/22/2014   Procedure: LEFT HEART CATHETERIZATION WITH CORONARY ANGIOGRAM;  Surgeon: Corky Crafts, MD;  Location: [facility] CATH LAB;  Service: Cardiovascular;  Laterality: N/A;   PERCUTANEOUS CORONARY STENT INTERVENTION (PCI-S)  10/22/2014   Procedure: PERCUTANEOUS CORONARY STENT INTERVENTION (PCI-S);  Surgeon: Corky Crafts, MD;  Location: [facility] CATH LAB;  Service: Cardiovascular;;  x3 to prox RCA , Prox CFX and dist CFX   TOTAL HIP ARTHROPLASTY Right    TOTAL HIP ARTHROPLASTY Left 04/27/2022    Procedure: TOTAL HIP ARTHROPLASTY;  Surgeon: Vickki Hearing, MD;  Location: [facility];  Service: Orthopedics;  Laterality: Left;   Patient Active Problem List   Diagnosis Date Noted   Osteoarthritis of left hip 04/27/2022   Unilateral primary osteoarthritis, left hip    CAD, multiple vessel, s/p DES to pLCX and dLCX, and pRCA 10/22/14 10/23/2014   DM2 (diabetes mellitus, type 2) (HCC) 10/23/2014   Tobacco use, stopped 6 months ago  10/23/2014   GERD (gastroesophageal reflux disease) 10/23/2014   Hyperlipidemia LDL goal <70 10/23/2014   NSTEMI (non-ST elevated myocardial infarction) (HCC) 10/22/2014    REFERRING DIAG: H82.99 (ICD-10-CM) - Primary osteoarthritis of left hip  evaluate and treat left hip 3x weekly for 6 weeks needs to begin therapy around 05/10/22 date of surgery 04/27/22  lateral approach  THERAPY DIAG:  Difficulty in walking, not elsewhere classified  S/P hip replacement, left  Pain in left hip  Stiffness of left hip, not elsewhere classified  Rationale for Evaluation and Treatment Rehabilitation  PERTINENT HISTORY: Arthritis B knees, chronic knee pain, chronic hip pain, right hip replaced over 10 years ago  PRECAUTIONS: Posterior hip - patient discusses understanding  SUBJECTIVE: overall doing well; 1/10 soreness in the hip today   PAIN:  Are you having pain? Yes: NPRS scale:  1/10 Pain location: left hip incision, B knees achy Pain description: achy Aggravating factors: colder temps, positioning, sleep Relieving factors: aspirin, gaba, one oxy as need , ice after movement as needed  OBJECTIVE:    DIAGNOSTIC FINDINGS: DG of pelvis post op with quoted report below =  "FINDINGS: Status post left hip arthroplasty with expected overlying postoperative change. No evidence of perihardware fracture or component malpositioning. Pre-existing right hip arthroplasty."     PATIENT SURVEYS:  FOTO 58   COGNITION:           Overall cognitive status: Within  functional limits for tasks assessed                          SENSATION: WFL   EDEMA:  Bandage present, patient reports edema, and some fluid/bleeding in region; to be assessed tomorrow at MD   MUSCLE LENGTH:     POSTURE: rounded shoulders, forward head, and weight shift right   PALPATION: NA secondary to surgical dressing in place   LOWER EXTREMITY ROM:   Active ROM Right eval Left eval  Hip flexion      Hip extension      Hip abduction      Hip adduction      Hip internal rotation      Hip external rotation      Knee flexion      Knee extension      Ankle dorsiflexion      Ankle plantarflexion      Ankle inversion      Ankle eversion       (Blank rows = not tested)   LOWER EXTREMITY MMT:   MMT Right eval Left eval  Hip flexion 57 105  Hip extension -4 0  Hip abduction 18 25  Hip adduction 0 10  Hip internal rotation      Hip external rotation      Knee flexion      Knee extension      Ankle dorsiflexion      Ankle plantarflexion      Ankle inversion      Ankle eversion       (Blank rows = not tested)   LOWER EXTREMITY SPECIAL TESTS:  Hip special tests: NT secondary to post op    FUNCTIONAL TESTS:  5 times sit to stand: 12.58 sec from slightly elevated plinth with B UE onto knees, no ADs Timed up and go (TUG): NT secondary to patient unable to be comfortable in standard chair  2 minute walk test: 220 feet with SPC   GAIT: Distance walked: 220 feet Assistive device utilized: Single point cane Level of assistance: SBA Comments: Equal stride length, slow cadence, minimal weight shift to left stance with cane with mod R UE pressure       TODAY'S TREATMENT: 05/21/22 Nustep seat 11 level 2 x 5 min dynamic warm up  Standing: Heel raise 2 x 10 Slant board 5 x 20" Marching 3# 2 x 10 Hip extension 3# 2 x 10 Mini squats 2 x 10 TKE's GTB x 20 6" box step ups with 1 UE assist 2 x 10 Tandem stance on foam x 30" each Rhomberg stance on foam x  30"     Seated:  LAQ's 3# 2 x 10 Hamstring curls GTB x 20       05/19/22 Nu step (seat 11) lv 2 6 min  Heel raise 2 x 10  Standing hip flexion  2 x 10 with 2lb   Standing hip extension 2 x 10 with 2lb  Standing hip abduction 2 x10with 2lb  Step up 6 inch 2 x10 HHA x 1 Semi tandem stance on foam 2 x 30" Mini squat at bars 2 x 10 Standing knee flexion 2 x 10 with 2lb   Clinic ambulation 1RT with SPC  Single leg stance with HHA x 1 support 5 x 10"   05/17/22 Nu step (seat 12) lv 2 6 min  Heel raise 2 x 10  Standing hip flexion 2 x 10  Standing hip extension 2 x 10  Standing hip abduction 2 x10 Step up 4 inch 2 x10 Lateral step up 4 inch 2 x 10  Semi tandem stance 2 x 30" Mini squat counter support 2 x 10 Standing knee flexion 2 x 10 Clinic ambulation 2RT with SPC  Single leg stance with HHA x 1 support 5 x 10"    05/13/22 Goal review Quad set 10 x 5" SLR 2 x10  Heel slide 10 x 5" Glute set 10 x 5"   Heel raise 2 x 10  Standing hip extension 2 x 10  Standing hip abduction 2 x10 Mini squat counter support 2 x 10 Standing knee flexion 2 x 10 Clinic ambulation 2RT with [facility]     05/11/22 - evaluation measurements and testing, HEP as below     PATIENT EDUCATION:  Education details: 05/11/22 - evaluation findings, POC, PT scope of practice, post operative healing time frames, ice recovery at home, cont elevation and pain management, HEP as below Person educated: Patient Education method: Explanation, Demonstration, and Handouts Education comprehension: verbalized understanding     HOME EXERCISE PROGRAM: Access Code: [email or web address] URL: [email or web address]  05/17/22 - Standing Knee Flexion with Unilateral Counter Support  - 2 x daily - 7 x weekly - 2 sets - 10 reps - Standing Single Leg Stance with Counter Support  - 2 x daily - 7 x weekly - 1 sets - 5 reps - 10 second hold Date: 05/11/2022 Prepared by: Lonzo Cloud   Exercises - Standing Heel  Raises  - 2 x daily - 7 x weekly - 3 sets - 10 reps - Standing Hip Abduction with Counter Support  - 2 x daily - 7 x weekly - 3 sets - 10 reps - Standing Hip Extension with Counter Support  - 2 x daily - 7 x weekly - 3 sets - 10 reps - Mini Squat with Counter Support  - 2 x daily - 7 x weekly - 3 sets - 10 reps   ASSESSMENT:   CLINICAL IMPRESSION: Today's session focused on lower extremity strengthening.  Continues with limp; decreased stance left lower extremity.  Added tandem stance on foam; patient with noted difficulty maintaining balance when left leg is back. Less limp noted after treatment.  Overall progressing well with no complaint of increased pain with treatment. Patient will continue to benefit from skilled therapy services to reduce remaining deficits and improve functional ability.     OBJECTIVE IMPAIRMENTS Abnormal gait, decreased activity tolerance, decreased balance, decreased endurance, decreased mobility, difficulty walking, decreased ROM, decreased strength, increased edema, increased fascial restrictions, impaired flexibility, improper body mechanics, and pain.    ACTIVITY LIMITATIONS carrying, lifting, bending, sitting, standing, squatting, sleeping, stairs, bathing, toileting, dressing, and locomotion level   PARTICIPATION LIMITATIONS: meal prep, cleaning, laundry, community activity, and yard work   PERSONAL FACTORS 1-2 comorbidities: DM, B knee arthritis  are also affecting patient's  functional outcome.    REHAB POTENTIAL: Good   CLINICAL DECISION MAKING: Stable/uncomplicated   EVALUATION COMPLEXITY: Low     GOALS: Goals reviewed with patient? No   SHORT TERM GOALS: Target date: 06/01/2022  Patient will be independent with initial HEP and self-management strategies to improve functional outcomes Baseline: 05/11/22 - initiated today Goal status: in progress   2.  Patient to be able to demonstrate improve L hip AROM to hip flexion max 90 deg for ability to sit  into standard chair Baseline: 05/11/22 - 0-57 today Goal status: in progress   3.  Patient to be able to demonstrate improved ambulation with no AD, equal WB, and continued equal stride length. Baseline:  Goal status: in progress       LONG TERM GOALS: Target date: 06/29/2022    Patient will be independent with advanced HEP and self-management strategies to improve functional outcomes   Baseline: to be established Goal status: in progress   2.  Patient will be able to demonstrate improved function in transitions by preforming 5 times sit to stand in less than 11 seconds with no UE use and equal WB from a standard chair Baseline: 05/11/22 - 12.5 sec with max weight on R Le Goal status: in progress   3.  Patient to demonstrate improved function by increasing FOTO functional score to at least 70.  Baseline: 05/11/22 - 58 current Goal status: in progress   4.  Patient to be able to ambulate at least 350 feet with no ADs and no pain for improved functional community ambulation.  Baseline: 05/11/22 - 220 feet with SPC Goal status: in progress     PLAN: PT FREQUENCY: 3x/week   PT DURATION: 6 weeks   PLANNED INTERVENTIONS: Therapeutic exercises, Therapeutic activity, Neuromuscular re-education, Balance training, Gait training, Patient/Family education, Self Care, Joint mobilization, Stair training, DME instructions, Cryotherapy, scar mobilization, Taping, Manual therapy, and Re-evaluation   PLAN FOR NEXT SESSION: Build functional strengthening while improving L hip ROM and supporting edema control as needed     8:55 AM, 05/21/22 [name] MPT [facility] [place] [phone] Ph:[phone]

## 2022-05-24 ENCOUNTER — Ambulatory Visit (HOSPITAL_COMMUNITY): Payer: Medicare HMO | Admitting: Physical Therapy

## 2022-05-24 ENCOUNTER — Encounter (HOSPITAL_COMMUNITY): Payer: Self-pay | Admitting: Physical Therapy

## 2022-05-24 DIAGNOSIS — M25652 Stiffness of left hip, not elsewhere classified: Secondary | ICD-10-CM | POA: Diagnosis not present

## 2022-05-24 DIAGNOSIS — R262 Difficulty in walking, not elsewhere classified: Secondary | ICD-10-CM

## 2022-05-24 DIAGNOSIS — Z96642 Presence of left artificial hip joint: Secondary | ICD-10-CM

## 2022-05-24 DIAGNOSIS — M25552 Pain in left hip: Secondary | ICD-10-CM | POA: Diagnosis not present

## 2022-05-24 DIAGNOSIS — M1612 Unilateral primary osteoarthritis, left hip: Secondary | ICD-10-CM | POA: Diagnosis not present

## 2022-05-24 NOTE — Therapy (Signed)
OUTPATIENT PHYSICAL THERAPY TREATMENT NOTE   Patient Name: [name] MRN: [id number] DOB:08/24/1956, 66 y.o., male Today's Date: 05/24/2022  PCP: Assunta Found, MD REFERRING PROVIDER: Vickki Hearing, MD   END OF SESSION:   PT End of Session - 05/24/22 1345     Visit Number 6    Number of Visits 19   eval + 3 x 6   Date for PT Re-Evaluation 06/29/22   3x6 weeks, extra week given for mid-week eval and make up time   Authorization Type [employer]    Authorization Time Period 12 approved 8/17-10/3/23    Authorization - Visit Number 5    Authorization - Number of Visits 12    Progress Note Due on Visit 10    PT Start Time 1344    PT Stop Time 1430    PT Time Calculation (min) 46 min    Equipment Utilized During Treatment Other (comment)   patient's SPC   Activity Tolerance Patient tolerated treatment well    Behavior During Therapy [facility] for tasks assessed/performed              Past Medical History:  Diagnosis Date   CAD, multiple vessel 10/23/2014   Chronic back pain    Chronic hip pain    Diabetes mellitus    DM2 (diabetes mellitus, type 2) (HCC) 10/23/2014   GERD (gastroesophageal reflux disease)    Heart attack (HCC)    Hyperlipidemia    Hyperlipidemia LDL goal <70 10/23/2014   Hypertension    Tobacco use, stopped 6 months ago  10/23/2014   Past Surgical History:  Procedure Laterality Date   LEFT HEART CATHETERIZATION WITH CORONARY ANGIOGRAM N/A 10/22/2014   Procedure: LEFT HEART CATHETERIZATION WITH CORONARY ANGIOGRAM;  Surgeon: Corky Crafts, MD;  Location: [facility] CATH LAB;  Service: Cardiovascular;  Laterality: N/A;   PERCUTANEOUS CORONARY STENT INTERVENTION (PCI-S)  10/22/2014   Procedure: PERCUTANEOUS CORONARY STENT INTERVENTION (PCI-S);  Surgeon: Corky Crafts, MD;  Location: [facility] CATH LAB;  Service: Cardiovascular;;  x3 to prox RCA , Prox CFX and dist CFX   TOTAL HIP ARTHROPLASTY Right    TOTAL HIP ARTHROPLASTY Left 04/27/2022    Procedure: TOTAL HIP ARTHROPLASTY;  Surgeon: Vickki Hearing, MD;  Location: [facility];  Service: Orthopedics;  Laterality: Left;   Patient Active Problem List   Diagnosis Date Noted   Osteoarthritis of left hip 04/27/2022   Unilateral primary osteoarthritis, left hip    CAD, multiple vessel, s/p DES to pLCX and dLCX, and pRCA 10/22/14 10/23/2014   DM2 (diabetes mellitus, type 2) (HCC) 10/23/2014   Tobacco use, stopped 6 months ago  10/23/2014   GERD (gastroesophageal reflux disease) 10/23/2014   Hyperlipidemia LDL goal <70 10/23/2014   NSTEMI (non-ST elevated myocardial infarction) (HCC) 10/22/2014    REFERRING DIAG: Q25.95 (ICD-10-CM) - Primary osteoarthritis of left hip  evaluate and treat left hip 3x weekly for 6 weeks needs to begin therapy around 05/10/22 date of surgery 04/27/22  lateral approach  THERAPY DIAG:  Difficulty in walking, not elsewhere classified  S/P hip replacement, left  Pain in left hip  Stiffness of left hip, not elsewhere classified  Rationale for Evaluation and Treatment Rehabilitation  PERTINENT HISTORY: Arthritis B knees, chronic knee pain, chronic hip pain, right hip replaced over 10 years ago  PRECAUTIONS: Posterior hip - patient discusses understanding  SUBJECTIVE: Patient denies any significant events, currently without any pain. States he has been more active around the house, using [facility]  for longer distances, but short distances without assistive device.   PAIN:  Are you having pain? No    OBJECTIVE:    DIAGNOSTIC FINDINGS: DG of pelvis post op with quoted report below =  "FINDINGS: Status post left hip arthroplasty with expected overlying postoperative change. No evidence of perihardware fracture or component malpositioning. Pre-existing right hip arthroplasty."     PATIENT SURVEYS:  FOTO 58   COGNITION:           Overall cognitive status: Within functional limits for tasks assessed                          SENSATION: WFL    EDEMA:  Bandage present, patient reports edema, and some fluid/bleeding in region; to be assessed tomorrow at MD   MUSCLE LENGTH:     POSTURE: rounded shoulders, forward head, and weight shift right   PALPATION: NA secondary to surgical dressing in place   LOWER EXTREMITY ROM:   Active ROM Right eval Left eval  Hip flexion      Hip extension      Hip abduction      Hip adduction      Hip internal rotation      Hip external rotation      Knee flexion      Knee extension      Ankle dorsiflexion      Ankle plantarflexion      Ankle inversion      Ankle eversion       (Blank rows = not tested)   LOWER EXTREMITY MMT:   MMT Right eval Left eval  Hip flexion 57 105  Hip extension -4 0  Hip abduction 18 25  Hip adduction 0 10  Hip internal rotation      Hip external rotation      Knee flexion      Knee extension      Ankle dorsiflexion      Ankle plantarflexion      Ankle inversion      Ankle eversion       (Blank rows = not tested)   LOWER EXTREMITY SPECIAL TESTS:  Hip special tests: NT secondary to post op    FUNCTIONAL TESTS:  5 times sit to stand: 12.58 sec from slightly elevated plinth with B UE onto knees, no ADs Timed up and go (TUG): NT secondary to patient unable to be comfortable in standard chair  2 minute walk test: 220 feet with SPC   GAIT: Distance walked: 220 feet Assistive device utilized: Single point cane Level of assistance: SBA Comments: Equal stride length, slow cadence, minimal weight shift to left stance with cane with mod R UE pressure       TODAY'S TREATMENT:  05/24/22 Nustep seat 11 level 2 x 5 min dynamic warm up  Standing: Heel raise  x20 6" box step ups with 1 UE assist 2 x 10 6" lateral box steps bil UE assist 2 x 10 Hip abduction RTB x20 ea Hip extension RTB 2 x10 ea Weighted retro-walk 3 plates x10  Balance: SLS fingertip touch x1 support, to no UE support 5x10"   Gait training level surface ~ 200' no  AD   05/21/22 Nustep seat 11 level 2 x 5 min dynamic warm up  Standing: Heel raise 2 x 10 Slant board 5 x 20" Marching 3# 2 x 10 Hip extension 3# 2 x 10 Mini squats 2 x 10 TKE's  GTB x 20 6" box step ups with 1 UE assist 2 x 10 Tandem stance on foam x 30" each Rhomberg stance on foam x 30"   Seated:  LAQ's 3# 2 x 10 Hamstring curls GTB x 20     05/19/22 Nu step (seat 11) lv 2 6 min  Heel raise 2 x 10  Standing hip flexion 2 x 10 with 2lb   Standing hip extension 2 x 10 with 2lb  Standing hip abduction 2 x10with 2lb  Step up 6 inch 2 x10 HHA x 1 Semi tandem stance on foam 2 x 30" Mini squat at bars 2 x 10 Standing knee flexion 2 x 10 with 2lb   Clinic ambulation 1RT with SPC  Single leg stance with HHA x 1 support 5 x 10"   05/17/22 Nu step (seat 12) lv 2 6 min  Heel raise 2 x 10  Standing hip flexion 2 x 10  Standing hip extension 2 x 10  Standing hip abduction 2 x10 Step up 4 inch 2 x10 Lateral step up 4 inch 2 x 10  Semi tandem stance 2 x 30" Mini squat counter support 2 x 10 Standing knee flexion 2 x 10 Clinic ambulation 2RT with SPC  Single leg stance with HHA x 1 support 5 x 10"        PATIENT EDUCATION:  Education details: 05/11/22 - evaluation findings, POC, PT scope of practice, post operative healing time frames, ice recovery at home, cont elevation and pain management, HEP as below Person educated: Patient Education method: Explanation, Demonstration, and Handouts Education comprehension: verbalized understanding     HOME EXERCISE PROGRAM: Access Code: [email or web address] URL: [email or web address]  05/24/22 - Standing Hip Abduction with Resistance at Ankles and Counter Support  - 2 x daily - 7 x weekly - 3 sets - 10 reps - Standing Hip Extension with Resistance at Ankles and Counter Support  - 2 x daily - 7 x weekly - 3 sets - 10 reps  05/17/22 - Standing Knee Flexion with Unilateral Counter Support  - 2 x daily - 7 x weekly - 2 sets - 10  reps - Standing Single Leg Stance with Counter Support  - 2 x daily - 7 x weekly - 1 sets - 5 reps - 10 second hold Date: 05/11/2022 Prepared by: Lonzo Cloud   Exercises - Standing Heel Raises  - 2 x daily - 7 x weekly - 3 sets - 10 reps - Standing Hip Abduction with Counter Support  - 2 x daily - 7 x weekly - 3 sets - 10 reps - Standing Hip Extension with Counter Support  - 2 x daily - 7 x weekly - 3 sets - 10 reps - Mini Squat with Counter Support  - 2 x daily - 7 x weekly - 3 sets - 10 reps   ASSESSMENT:   CLINICAL IMPRESSION: Continued progression of lower extremity strengthening with increased resistance and dynamic challenges. Still with notable antalgic gait pattern with decreased stance time on Lt. Still challenged by single leg balance, encouraged reducing UE support as able. Patient will continue to benefit from skilled therapy services to reduce remaining deficits and improve functional ability.     OBJECTIVE IMPAIRMENTS Abnormal gait, decreased activity tolerance, decreased balance, decreased endurance, decreased mobility, difficulty walking, decreased ROM, decreased strength, increased edema, increased fascial restrictions, impaired flexibility, improper body mechanics, and pain.    ACTIVITY LIMITATIONS carrying, lifting, bending, sitting, standing, squatting, sleeping, stairs, bathing, toileting, dressing,  and locomotion level   PARTICIPATION LIMITATIONS: meal prep, cleaning, laundry, community activity, and yard work   PERSONAL FACTORS 1-2 comorbidities: DM, B knee arthritis  are also affecting patient's functional outcome.    REHAB POTENTIAL: Good   CLINICAL DECISION MAKING: Stable/uncomplicated   EVALUATION COMPLEXITY: Low     GOALS: Goals reviewed with patient? No   SHORT TERM GOALS: Target date: 06/01/2022  Patient will be independent with initial HEP and self-management strategies to improve functional outcomes Baseline: 05/11/22 - initiated today Goal  status: in progress   2.  Patient to be able to demonstrate improve L hip AROM to hip flexion max 90 deg for ability to sit into standard chair Baseline: 05/11/22 - 0-57 today Goal status: in progress   3.  Patient to be able to demonstrate improved ambulation with no AD, equal WB, and continued equal stride length. Baseline:  Goal status: in progress       LONG TERM GOALS: Target date: 06/29/2022    Patient will be independent with advanced HEP and self-management strategies to improve functional outcomes   Baseline: to be established Goal status: in progress   2.  Patient will be able to demonstrate improved function in transitions by preforming 5 times sit to stand in less than 11 seconds with no UE use and equal WB from a standard chair Baseline: 05/11/22 - 12.5 sec with max weight on R Le Goal status: in progress   3.  Patient to demonstrate improved function by increasing FOTO functional score to at least 70.  Baseline: 05/11/22 - 58 current Goal status: in progress   4.  Patient to be able to ambulate at least 350 feet with no ADs and no pain for improved functional community ambulation.  Baseline: 05/11/22 - 220 feet with SPC Goal status: in progress     PLAN: PT FREQUENCY: 3x/week   PT DURATION: 6 weeks   PLANNED INTERVENTIONS: Therapeutic exercises, Therapeutic activity, Neuromuscular re-education, Balance training, Gait training, Patient/Family education, Self Care, Joint mobilization, Stair training, DME instructions, Cryotherapy, scar mobilization, Taping, Manual therapy, and Re-evaluation   PLAN FOR NEXT SESSION: Build functional strengthening while improving L hip ROM and supporting edema control as needed      2:35 PM, 05/24/22 Josue Hector PT DPT  Physical Therapist with [facility], [facility], [facility]  [phone]

## 2022-05-26 ENCOUNTER — Ambulatory Visit (HOSPITAL_COMMUNITY): Payer: Medicare HMO | Admitting: Physical Therapy

## 2022-05-26 DIAGNOSIS — M25652 Stiffness of left hip, not elsewhere classified: Secondary | ICD-10-CM

## 2022-05-26 DIAGNOSIS — M1612 Unilateral primary osteoarthritis, left hip: Secondary | ICD-10-CM | POA: Diagnosis not present

## 2022-05-26 DIAGNOSIS — M25552 Pain in left hip: Secondary | ICD-10-CM

## 2022-05-26 DIAGNOSIS — R262 Difficulty in walking, not elsewhere classified: Secondary | ICD-10-CM | POA: Diagnosis not present

## 2022-05-26 DIAGNOSIS — Z96642 Presence of left artificial hip joint: Secondary | ICD-10-CM

## 2022-05-26 NOTE — Therapy (Signed)
OUTPATIENT PHYSICAL THERAPY TREATMENT NOTE   Patient Name: [name] MRN: [id number] DOB:04-26-1956, 66 y.o., male Today's Date: 05/26/2022  PCP: Assunta Found, MD REFERRING PROVIDER: Vickki Hearing, MD   END OF SESSION:   PT End of Session - 05/26/22 1305     Visit Number 7    Number of Visits 19   eval + 3 x 6   Date for PT Re-Evaluation 06/29/22   3x6 weeks, extra week given for mid-week eval and make up time   Authorization Type [employer]    Authorization Time Period 12 approved 8/17-10/3/23    Authorization - Visit Number 6    Authorization - Number of Visits 12    Progress Note Due on Visit 10    PT Start Time 1304    PT Stop Time 1343    PT Time Calculation (min) 39 min    Equipment Utilized During Treatment Other (comment)   patient's SPC   Activity Tolerance Patient tolerated treatment well    Behavior During Therapy [facility] for tasks assessed/performed              Past Medical History:  Diagnosis Date   CAD, multiple vessel 10/23/2014   Chronic back pain    Chronic hip pain    Diabetes mellitus    DM2 (diabetes mellitus, type 2) (HCC) 10/23/2014   GERD (gastroesophageal reflux disease)    Heart attack (HCC)    Hyperlipidemia    Hyperlipidemia LDL goal <70 10/23/2014   Hypertension    Tobacco use, stopped 6 months ago  10/23/2014   Past Surgical History:  Procedure Laterality Date   LEFT HEART CATHETERIZATION WITH CORONARY ANGIOGRAM N/A 10/22/2014   Procedure: LEFT HEART CATHETERIZATION WITH CORONARY ANGIOGRAM;  Surgeon: Corky Crafts, MD;  Location: [facility] CATH LAB;  Service: Cardiovascular;  Laterality: N/A;   PERCUTANEOUS CORONARY STENT INTERVENTION (PCI-S)  10/22/2014   Procedure: PERCUTANEOUS CORONARY STENT INTERVENTION (PCI-S);  Surgeon: Corky Crafts, MD;  Location: [facility] CATH LAB;  Service: Cardiovascular;;  x3 to prox RCA , Prox CFX and dist CFX   TOTAL HIP ARTHROPLASTY Right    TOTAL HIP ARTHROPLASTY Left 04/27/2022    Procedure: TOTAL HIP ARTHROPLASTY;  Surgeon: Vickki Hearing, MD;  Location: [facility] ORS;  Service: Orthopedics;  Laterality: Left;   Patient Active Problem List   Diagnosis Date Noted   Osteoarthritis of left hip 04/27/2022   Unilateral primary osteoarthritis, left hip    CAD, multiple vessel, s/p DES to pLCX and dLCX, and pRCA 10/22/14 10/23/2014   DM2 (diabetes mellitus, type 2) (HCC) 10/23/2014   Tobacco use, stopped 6 months ago  10/23/2014   GERD (gastroesophageal reflux disease) 10/23/2014   Hyperlipidemia LDL goal <70 10/23/2014   NSTEMI (non-ST elevated myocardial infarction) (HCC) 10/22/2014    REFERRING DIAG: N82.95 (ICD-10-CM) - Primary osteoarthritis of left hip  evaluate and treat left hip 3x weekly for 6 weeks needs to begin therapy around 05/10/22 date of surgery 04/27/22  lateral approach  THERAPY DIAG:  Difficulty in walking, not elsewhere classified  S/P hip replacement, left  Pain in left hip  Stiffness of left hip, not elsewhere classified  Rationale for Evaluation and Treatment Rehabilitation  PERTINENT HISTORY: Arthritis B knees, chronic knee pain, chronic hip pain, right hip replaced over 10 years ago  PRECAUTIONS: Posterior hip - patient discusses understanding  SUBJECTIVE: Doing pretty good today. He noticed that with just a finger touch assist he can walk fairly straight, but with  no assist at all he walks like a penguin.   PAIN:  Are you having pain? Yes: NPRS scale: 1/10 Pain location: left hip incision, B knees achy Pain description: achy Aggravating factors: colder temps, positioning, sleep Relieving factors: aspirin, gaba, one oxy as need , ice after movement as needed    OBJECTIVE:    DIAGNOSTIC FINDINGS: DG of pelvis post op with quoted report below =  "FINDINGS: Status post left hip arthroplasty with expected overlying postoperative change. No evidence of perihardware fracture or component malpositioning. Pre-existing right hip  arthroplasty."     PATIENT SURVEYS:  FOTO 58   COGNITION:           Overall cognitive status: Within functional limits for tasks assessed                          SENSATION: WFL   EDEMA:  Bandage present, patient reports edema, and some fluid/bleeding in region; to be assessed tomorrow at MD   MUSCLE LENGTH:     POSTURE: rounded shoulders, forward head, and weight shift right   PALPATION: NA secondary to surgical dressing in place   LOWER EXTREMITY ROM:   Active ROM Right eval Left eval  Hip flexion      Hip extension      Hip abduction      Hip adduction      Hip internal rotation      Hip external rotation      Knee flexion      Knee extension      Ankle dorsiflexion      Ankle plantarflexion      Ankle inversion      Ankle eversion       (Blank rows = not tested)   LOWER EXTREMITY MMT:   MMT Right eval Left eval  Hip flexion 57 105  Hip extension -4 0  Hip abduction 18 25  Hip adduction 0 10  Hip internal rotation      Hip external rotation      Knee flexion      Knee extension      Ankle dorsiflexion      Ankle plantarflexion      Ankle inversion      Ankle eversion       (Blank rows = not tested)   LOWER EXTREMITY SPECIAL TESTS:  Hip special tests: NT secondary to post op    FUNCTIONAL TESTS:  5 times sit to stand: 12.58 sec from slightly elevated plinth with B UE onto knees, no ADs Timed up and go (TUG): NT secondary to patient unable to be comfortable in standard chair  2 minute walk test: 220 feet with SPC   GAIT: Distance walked: 220 feet Assistive device utilized: Single point cane Level of assistance: SBA Comments: Equal stride length, slow cadence, minimal weight shift to left stance with cane with mod R UE pressure       TODAY'S TREATMENT:  05/26/22 Nustep seat 11 level 3 x 5 min for strength and ROM  Standing: 6" box step ups with 1 UE assist 2 x 10 6" lateral box steps bil UE assist 2 x 10 6" step down 2 x 10 HHA x  1 Hip abduction GTB x20 ea Hip extension GTB 2 x10 ea Partial lunge at 7 inch step front LE elevated 2x 10 HHA x 2 Weighted retro-walk 4 plates Q98   Balance: SLS fingertip touch x1 support, to no UE  support 5x10"   Gait training level surface ~ 200' no AD 05/24/22 Nustep seat 11 level 2 x 5 min dynamic warm up  Standing: Heel raise  x20 6" box step ups with 1 UE assist 2 x 10 6" lateral box steps bil UE assist 2 x 10 Hip abduction RTB x20 ea Hip extension RTB 2 x10 ea Weighted retro-walk 3 plates B28  Balance: SLS fingertip touch x1 support, to no UE support 5x10"   Gait training level surface ~ 200' no AD   05/21/22 Nustep seat 11 level 2 x 5 min dynamic warm up  Standing: Heel raise 2 x 10 Slant board 5 x 20" Marching 3# 2 x 10 Hip extension 3# 2 x 10 Mini squats 2 x 10 TKE's GTB x 20 6" box step ups with 1 UE assist 2 x 10 Tandem stance on foam x 30" each Rhomberg stance on foam x 30"  Seated:  LAQ's 3# 2 x 10 Hamstring curls GTB x 20   PATIENT EDUCATION:  Education details: 05/11/22 - evaluation findings, POC, PT scope of practice, post operative healing time frames, ice recovery at home, cont elevation and pain management, HEP as below Person educated: Patient Education method: Explanation, Demonstration, and Handouts Education comprehension: verbalized understanding     HOME EXERCISE PROGRAM: Access Code: [email or web address] URL: [email or web address]  05/24/22 - Standing Hip Abduction with Resistance at Ankles and Counter Support  - 2 x daily - 7 x weekly - 3 sets - 10 reps - Standing Hip Extension with Resistance at Ankles and Counter Support  - 2 x daily - 7 x weekly - 3 sets - 10 reps  05/17/22 - Standing Knee Flexion with Unilateral Counter Support  - 2 x daily - 7 x weekly - 2 sets - 10 reps - Standing Single Leg Stance with Counter Support  - 2 x daily - 7 x weekly - 1 sets - 5 reps - 10 second hold Date: 05/11/2022 Prepared by: Lonzo Cloud   Exercises - Standing Heel Raises  - 2 x daily - 7 x weekly - 3 sets - 10 reps - Standing Hip Abduction with Counter Support  - 2 x daily - 7 x weekly - 3 sets - 10 reps - Standing Hip Extension with Counter Support  - 2 x daily - 7 x weekly - 3 sets - 10 reps - Mini Squat with Counter Support  - 2 x daily - 7 x weekly - 3 sets - 10 reps   ASSESSMENT:   CLINICAL IMPRESSION: Progressed LE strength activity with increased resistance band to green. Added partial lunge at step for strengthening and improved functional hip mobility. Patient continues to display gait deviation and decreased LT stance time despite no complaint of pain and good show of strength. Patient will continue to benefit from skilled therapy services to reduce remaining deficits and improve functional ability.     OBJECTIVE IMPAIRMENTS Abnormal gait, decreased activity tolerance, decreased balance, decreased endurance, decreased mobility, difficulty walking, decreased ROM, decreased strength, increased edema, increased fascial restrictions, impaired flexibility, improper body mechanics, and pain.    ACTIVITY LIMITATIONS carrying, lifting, bending, sitting, standing, squatting, sleeping, stairs, bathing, toileting, dressing, and locomotion level   PARTICIPATION LIMITATIONS: meal prep, cleaning, laundry, community activity, and yard work   PERSONAL FACTORS 1-2 comorbidities: DM, B knee arthritis  are also affecting patient's functional outcome.    REHAB POTENTIAL: Good   CLINICAL DECISION MAKING: Stable/uncomplicated   EVALUATION COMPLEXITY: Low  GOALS: Goals reviewed with patient? No   SHORT TERM GOALS: Target date: 06/01/2022  Patient will be independent with initial HEP and self-management strategies to improve functional outcomes Baseline: 05/11/22 - initiated today Goal status: in progress   2.  Patient to be able to demonstrate improve L hip AROM to hip flexion max 90 deg for ability to sit into  standard chair Baseline: 05/11/22 - 0-57 today Goal status: in progress   3.  Patient to be able to demonstrate improved ambulation with no AD, equal WB, and continued equal stride length. Baseline:  Goal status: in progress       LONG TERM GOALS: Target date: 06/29/2022    Patient will be independent with advanced HEP and self-management strategies to improve functional outcomes   Baseline: to be established Goal status: in progress   2.  Patient will be able to demonstrate improved function in transitions by preforming 5 times sit to stand in less than 11 seconds with no UE use and equal WB from a standard chair Baseline: 05/11/22 - 12.5 sec with max weight on R Le Goal status: in progress   3.  Patient to demonstrate improved function by increasing FOTO functional score to at least 70.  Baseline: 05/11/22 - 58 current Goal status: in progress   4.  Patient to be able to ambulate at least 350 feet with no ADs and no pain for improved functional community ambulation.  Baseline: 05/11/22 - 220 feet with SPC Goal status: in progress     PLAN: PT FREQUENCY: 3x/week   PT DURATION: 6 weeks   PLANNED INTERVENTIONS: Therapeutic exercises, Therapeutic activity, Neuromuscular re-education, Balance training, Gait training, Patient/Family education, Self Care, Joint mobilization, Stair training, DME instructions, Cryotherapy, scar mobilization, Taping, Manual therapy, and Re-evaluation   PLAN FOR NEXT SESSION: Build functional strengthening while improving L hip ROM and supporting edema control as needed      1:07 PM, 05/26/22 Georges Lynch PT DPT  Physical Therapist with [facility]  [phone]

## 2022-05-28 ENCOUNTER — Ambulatory Visit (HOSPITAL_COMMUNITY): Payer: Medicare HMO | Attending: Orthopedic Surgery

## 2022-05-28 DIAGNOSIS — M25552 Pain in left hip: Secondary | ICD-10-CM | POA: Diagnosis not present

## 2022-05-28 DIAGNOSIS — R262 Difficulty in walking, not elsewhere classified: Secondary | ICD-10-CM

## 2022-05-28 DIAGNOSIS — M25652 Stiffness of left hip, not elsewhere classified: Secondary | ICD-10-CM

## 2022-05-28 DIAGNOSIS — Z96642 Presence of left artificial hip joint: Secondary | ICD-10-CM

## 2022-05-28 NOTE — Therapy (Signed)
OUTPATIENT PHYSICAL THERAPY TREATMENT NOTE   Patient Name: [name] MRN: [id number] DOB:[birth year], 66 y.o., male Today's Date: 05/28/2022  PCP: Assunta Found, MD REFERRING PROVIDER: Vickki Hearing, MD   END OF SESSION:   PT End of Session - 05/28/22 1032     Visit Number 8    Number of Visits 19   eval + 3 x 6   Date for PT Re-Evaluation 06/29/22   3x6 weeks, extra week given for mid-week eval and make up time   Authorization Type [employer]    Authorization Time Period 12 approved 8/17-10/3/23    Authorization - Visit Number 7    Authorization - Number of Visits 12    Progress Note Due on Visit 10    PT Start Time 1031    PT Stop Time 1114    PT Time Calculation (min) 43 min    Equipment Utilized During Treatment Other (comment)   patient's SPC   Activity Tolerance Patient tolerated treatment well    Behavior During Therapy [facility] for tasks assessed/performed               Past Medical History:  Diagnosis Date   CAD, multiple vessel 10/23/2014   Chronic back pain    Chronic hip pain    Diabetes mellitus    DM2 (diabetes mellitus, type 2) (HCC) 10/23/2014   GERD (gastroesophageal reflux disease)    Heart attack (HCC)    Hyperlipidemia    Hyperlipidemia LDL goal <70 10/23/2014   Hypertension    Tobacco use, stopped 6 months ago  10/23/2014   Past Surgical History:  Procedure Laterality Date   LEFT HEART CATHETERIZATION WITH CORONARY ANGIOGRAM N/A 10/22/2014   Procedure: LEFT HEART CATHETERIZATION WITH CORONARY ANGIOGRAM;  Surgeon: Corky Crafts, MD;  Location: [facility] CATH LAB;  Service: Cardiovascular;  Laterality: N/A;   PERCUTANEOUS CORONARY STENT INTERVENTION (PCI-S)  10/22/2014   Procedure: PERCUTANEOUS CORONARY STENT INTERVENTION (PCI-S);  Surgeon: Corky Crafts, MD;  Location: [facility] CATH LAB;  Service: Cardiovascular;;  x3 to prox RCA , Prox CFX and dist CFX   TOTAL HIP ARTHROPLASTY Right    TOTAL HIP ARTHROPLASTY Left 04/27/2022    Procedure: TOTAL HIP ARTHROPLASTY;  Surgeon: Vickki Hearing, MD;  Location: [facility] ORS;  Service: Orthopedics;  Laterality: Left;   Patient Active Problem List   Diagnosis Date Noted   Osteoarthritis of left hip 04/27/2022   Unilateral primary osteoarthritis, left hip    CAD, multiple vessel, s/p DES to pLCX and dLCX, and pRCA 10/22/14 10/23/2014   DM2 (diabetes mellitus, type 2) (HCC) 10/23/2014   Tobacco use, stopped 6 months ago  10/23/2014   GERD (gastroesophageal reflux disease) 10/23/2014   Hyperlipidemia LDL goal <70 10/23/2014   NSTEMI (non-ST elevated myocardial infarction) (HCC) 10/22/2014    REFERRING DIAG: O71.21 (ICD-10-CM) - Primary osteoarthritis of left hip  evaluate and treat left hip 3x weekly for 6 weeks needs to begin therapy around 05/10/22 date of surgery 04/27/22  lateral approach  THERAPY DIAG:  Stiffness of left hip, not elsewhere classified  Difficulty in walking, not elsewhere classified  S/P hip replacement, left  Pain in left hip  Rationale for Evaluation and Treatment Rehabilitation  PERTINENT HISTORY: Arthritis B knees, chronic knee pain, chronic hip pain, right hip replaced over 10 years ago  PRECAUTIONS: Posterior hip - patient discusses understanding  SUBJECTIVE: little sore yesterday but that is gone today  PAIN:  Are you having pain? Yes: NPRS scale: 1/10  Pain location: left hip incision, B knees achy Pain description: achy Aggravating factors: colder temps, positioning, sleep Relieving factors: aspirin, gaba, one oxy as need , ice after movement as needed    OBJECTIVE:    DIAGNOSTIC FINDINGS: DG of pelvis post op with quoted report below =  "FINDINGS: Status post left hip arthroplasty with expected overlying postoperative change. No evidence of perihardware fracture or component malpositioning. Pre-existing right hip arthroplasty."     PATIENT SURVEYS:  FOTO 58   COGNITION:           Overall cognitive status: Within functional  limits for tasks assessed                          SENSATION: WFL   EDEMA:  Bandage present, patient reports edema, and some fluid/bleeding in region; to be assessed tomorrow at MD   MUSCLE LENGTH:     POSTURE: rounded shoulders, forward head, and weight shift right   PALPATION: NA secondary to surgical dressing in place   LOWER EXTREMITY ROM:   Active ROM Right eval Left eval  Hip flexion      Hip extension      Hip abduction      Hip adduction      Hip internal rotation      Hip external rotation      Knee flexion      Knee extension      Ankle dorsiflexion      Ankle plantarflexion      Ankle inversion      Ankle eversion       (Blank rows = not tested)   LOWER EXTREMITY MMT:   MMT Right eval Left eval  Hip flexion 57 105  Hip extension -4 0  Hip abduction 18 25  Hip adduction 0 10  Hip internal rotation      Hip external rotation      Knee flexion      Knee extension      Ankle dorsiflexion      Ankle plantarflexion      Ankle inversion      Ankle eversion       (Blank rows = not tested)   LOWER EXTREMITY SPECIAL TESTS:  Hip special tests: NT secondary to post op    FUNCTIONAL TESTS:  5 times sit to stand: 12.58 sec from slightly elevated plinth with B UE onto knees, no ADs Timed up and go (TUG): NT secondary to patient unable to be comfortable in standard chair  2 minute walk test: 220 feet with SPC   GAIT: Distance walked: 220 feet Assistive device utilized: Single point cane Level of assistance: SBA Comments: Equal stride length, slow cadence, minimal weight shift to left stance with cane with mod R UE pressure       TODAY'S TREATMENT:  05/28/22 Nustep seat 11 level 3 x 5 min for strength and ROM  Standing: Heel/toe raises 2 x 10 Slant board 5 x 20" 6" box step ups with 1 UE assist 2 x 10 6" lateral box steps bil UE assist 2 x 10 6" step down 2 x 10 HHA x 1 Tandem stance 2 x 20" each way  Body craft weighted walk outs 4 plates x  5  Green tband around knees sidestepping // bars down and back x 5  4" box step downs (heel tap) 2 x 10      05/26/22 Nustep seat 11 level 3 x  5 min for strength and ROM  Standing: 6" box step ups with 1 UE assist 2 x 10 6" lateral box steps bil UE assist 2 x 10 6" step down 2 x 10 HHA x 1 Hip abduction GTB x20 ea Hip extension GTB 2 x10 ea Partial lunge at 7 inch step front LE elevated 2x 10 HHA x 2 Weighted retro-walk 4 plates Z61   Balance: SLS fingertip touch x1 support, to no UE support 5x10"   Gait training level surface ~ 200' no AD 05/24/22 Nustep seat 11 level 2 x 5 min dynamic warm up  Standing: Heel raise  x20 6" box step ups with 1 UE assist 2 x 10 6" lateral box steps bil UE assist 2 x 10 Hip abduction RTB x20 ea Hip extension RTB 2 x10 ea Weighted retro-walk 3 plates W96  Balance: SLS fingertip touch x1 support, to no UE support 5x10"   Gait training level surface ~ 200' no AD   05/21/22 Nustep seat 11 level 2 x 5 min dynamic warm up  Standing: Heel raise 2 x 10 Slant board 5 x 20" Marching 3# 2 x 10 Hip extension 3# 2 x 10 Mini squats 2 x 10 TKE's GTB x 20 6" box step ups with 1 UE assist 2 x 10 Tandem stance on foam x 30" each Rhomberg stance on foam x 30"  Seated:  LAQ's 3# 2 x 10 Hamstring curls GTB x 20   PATIENT EDUCATION:  Education details: 05/11/22 - evaluation findings, POC, PT scope of practice, post operative healing time frames, ice recovery at home, cont elevation and pain management, HEP as below Person educated: Patient Education method: Explanation, Demonstration, and Handouts Education comprehension: verbalized understanding     HOME EXERCISE PROGRAM:  05/28/22 tandem stance Access Code: [email or web address] URL: [email or web address]  05/24/22 - Standing Hip Abduction with Resistance at Ankles and Counter Support  - 2 x daily - 7 x weekly - 3 sets - 10 reps - Standing Hip Extension with Resistance at Ankles and  Counter Support  - 2 x daily - 7 x weekly - 3 sets - 10 reps  05/17/22 - Standing Knee Flexion with Unilateral Counter Support  - 2 x daily - 7 x weekly - 2 sets - 10 reps - Standing Single Leg Stance with Counter Support  - 2 x daily - 7 x weekly - 1 sets - 5 reps - 10 second hold Date: 05/11/2022 Prepared by: Lonzo Cloud   Exercises - Standing Heel Raises  - 2 x daily - 7 x weekly - 3 sets - 10 reps - Standing Hip Abduction with Counter Support  - 2 x daily - 7 x weekly - 3 sets - 10 reps - Standing Hip Extension with Counter Support  - 2 x daily - 7 x weekly - 3 sets - 10 reps - Mini Squat with Counter Support  - 2 x daily - 7 x weekly - 3 sets - 10 reps   ASSESSMENT:   CLINICAL IMPRESSION: Today's session continued to address lower extremity strengthening, gait and balance training.  Added tandem stance to HEP; patient unable to hold with left leg back more than 5 sec.  Patient continues with limp, increased lateral trunk movement with stance phase on left; decreased stance left lower extremity so continue to address control and strengthening of the hip.  Patient will continue to benefit from skilled therapy services to reduce remaining deficits and improve functional ability.  OBJECTIVE IMPAIRMENTS Abnormal gait, decreased activity tolerance, decreased balance, decreased endurance, decreased mobility, difficulty walking, decreased ROM, decreased strength, increased edema, increased fascial restrictions, impaired flexibility, improper body mechanics, and pain.    ACTIVITY LIMITATIONS carrying, lifting, bending, sitting, standing, squatting, sleeping, stairs, bathing, toileting, dressing, and locomotion level   PARTICIPATION LIMITATIONS: meal prep, cleaning, laundry, community activity, and yard work   PERSONAL FACTORS 1-2 comorbidities: DM, B knee arthritis  are also affecting patient's functional outcome.    REHAB POTENTIAL: Good   CLINICAL DECISION MAKING:  Stable/uncomplicated   EVALUATION COMPLEXITY: Low     GOALS: Goals reviewed with patient? No   SHORT TERM GOALS: Target date: 06/01/2022  Patient will be independent with initial HEP and self-management strategies to improve functional outcomes Baseline: 05/11/22 - initiated today Goal status: in progress   2.  Patient to be able to demonstrate improve L hip AROM to hip flexion max 90 deg for ability to sit into standard chair Baseline: 05/11/22 - 0-57 today Goal status: in progress   3.  Patient to be able to demonstrate improved ambulation with no AD, equal WB, and continued equal stride length. Baseline:  Goal status: in progress       LONG TERM GOALS: Target date: 06/29/2022    Patient will be independent with advanced HEP and self-management strategies to improve functional outcomes   Baseline: to be established Goal status: in progress   2.  Patient will be able to demonstrate improved function in transitions by preforming 5 times sit to stand in less than 11 seconds with no UE use and equal WB from a standard chair Baseline: 05/11/22 - 12.5 sec with max weight on R Le Goal status: in progress   3.  Patient to demonstrate improved function by increasing FOTO functional score to at least 70.  Baseline: 05/11/22 - 58 current Goal status: in progress   4.  Patient to be able to ambulate at least 350 feet with no ADs and no pain for improved functional community ambulation.  Baseline: 05/11/22 - 220 feet with SPC Goal status: in progress     PLAN: PT FREQUENCY: 3x/week   PT DURATION: 6 weeks   PLANNED INTERVENTIONS: Therapeutic exercises, Therapeutic activity, Neuromuscular re-education, Balance training, Gait training, Patient/Family education, Self Care, Joint mobilization, Stair training, DME instructions, Cryotherapy, scar mobilization, Taping, Manual therapy, and Re-evaluation   PLAN FOR NEXT SESSION: Build functional strengthening while improving L hip ROM and  supporting edema control as needed      10:33 AM, 05/28/22 Jahni Paul Small Nandini Bogdanski MPT [facility] [place] [phone] Ph:[phone]

## 2022-06-01 ENCOUNTER — Ambulatory Visit (HOSPITAL_COMMUNITY): Payer: Medicare HMO | Admitting: Physical Therapy

## 2022-06-01 DIAGNOSIS — M25652 Stiffness of left hip, not elsewhere classified: Secondary | ICD-10-CM | POA: Diagnosis not present

## 2022-06-01 DIAGNOSIS — Z96642 Presence of left artificial hip joint: Secondary | ICD-10-CM

## 2022-06-01 DIAGNOSIS — M25552 Pain in left hip: Secondary | ICD-10-CM | POA: Diagnosis not present

## 2022-06-01 DIAGNOSIS — R262 Difficulty in walking, not elsewhere classified: Secondary | ICD-10-CM | POA: Diagnosis not present

## 2022-06-01 NOTE — Therapy (Signed)
OUTPATIENT PHYSICAL THERAPY TREATMENT NOTE   Patient Name: SISTO GRANILLO MRN: [id number] DOB:08/11/1956, 66 y.o., male Today's Date: 06/01/2022  PCP: Assunta Found, MD REFERRING PROVIDER: Vickki Hearing, MD   END OF SESSION:   PT End of Session - 06/01/22 1353     Visit Number 9    Number of Visits 19   eval + 3 x 6   Date for PT Re-Evaluation 06/29/22   3x6 weeks, extra week given for mid-week eval and make up time   Authorization Type [employer]    Authorization Time Period 12 approved 8/17-10/3/23    Authorization - Visit Number 8    Authorization - Number of Visits 12    Progress Note Due on Visit 10    PT Start Time 1349    PT Stop Time 1428    PT Time Calculation (min) 39 min    Equipment Utilized During Treatment Other (comment)   patient's SPC   Activity Tolerance Patient tolerated treatment well    Behavior During Therapy [facility] for tasks assessed/performed               Past Medical History:  Diagnosis Date   CAD, multiple vessel 10/23/2014   Chronic back pain    Chronic hip pain    Diabetes mellitus    DM2 (diabetes mellitus, type 2) (HCC) 10/23/2014   GERD (gastroesophageal reflux disease)    Heart attack (HCC)    Hyperlipidemia    Hyperlipidemia LDL goal <70 10/23/2014   Hypertension    Tobacco use, stopped 6 months ago  10/23/2014   Past Surgical History:  Procedure Laterality Date   LEFT HEART CATHETERIZATION WITH CORONARY ANGIOGRAM N/A 10/22/2014   Procedure: LEFT HEART CATHETERIZATION WITH CORONARY ANGIOGRAM;  Surgeon: Corky Crafts, MD;  Location: [facility] CATH LAB;  Service: Cardiovascular;  Laterality: N/A;   PERCUTANEOUS CORONARY STENT INTERVENTION (PCI-S)  10/22/2014   Procedure: PERCUTANEOUS CORONARY STENT INTERVENTION (PCI-S);  Surgeon: Corky Crafts, MD;  Location: [facility] CATH LAB;  Service: Cardiovascular;;  x3 to prox RCA , Prox CFX and dist CFX   TOTAL HIP ARTHROPLASTY Right    TOTAL HIP ARTHROPLASTY Left 04/27/2022    Procedure: TOTAL HIP ARTHROPLASTY;  Surgeon: Vickki Hearing, MD;  Location: [facility];  Service: Orthopedics;  Laterality: Left;   Patient Active Problem List   Diagnosis Date Noted   Osteoarthritis of left hip 04/27/2022   Unilateral primary osteoarthritis, left hip    CAD, multiple vessel, s/p DES to pLCX and dLCX, and pRCA 10/22/14 10/23/2014   DM2 (diabetes mellitus, type 2) (HCC) 10/23/2014   Tobacco use, stopped 6 months ago  10/23/2014   GERD (gastroesophageal reflux disease) 10/23/2014   Hyperlipidemia LDL goal <70 10/23/2014   NSTEMI (non-ST elevated myocardial infarction) (HCC) 10/22/2014    REFERRING DIAG: G64.40 (ICD-10-CM) - Primary osteoarthritis of left hip  evaluate and treat left hip 3x weekly for 6 weeks needs to begin therapy around 05/10/22 date of surgery 04/27/22  lateral approach  THERAPY DIAG:  Stiffness of left hip, not elsewhere classified  Difficulty in walking, not elsewhere classified  S/P hip replacement, left  Pain in left hip  Rationale for Evaluation and Treatment Rehabilitation  PERTINENT HISTORY: Arthritis B knees, chronic knee pain, chronic hip pain, right hip replaced over 10 years ago  PRECAUTIONS: Posterior hip - patient discusses understanding    SUBJECTIVE: Doing ok. Has been using elliptical at home and has been a little sore in the back  of the legs. Still feels he is limping a little when walking.   PAIN:  Are you having pain? Yes: NPRS scale: 1/10 Pain location: left hip incision, B knees achy Pain description: achy Aggravating factors: colder temps, positioning, sleep Relieving factors: aspirin, gaba, one oxy as need , ice after movement as needed    OBJECTIVE:    DIAGNOSTIC FINDINGS: DG of pelvis post op with quoted report below =  "FINDINGS: Status post left hip arthroplasty with expected overlying postoperative change. No evidence of perihardware fracture or component malpositioning. Pre-existing right hip  arthroplasty."     PATIENT SURVEYS:  FOTO 58   COGNITION:           Overall cognitive status: Within functional limits for tasks assessed                          SENSATION: WFL   EDEMA:  Bandage present, patient reports edema, and some fluid/bleeding in region; to be assessed tomorrow at MD   MUSCLE LENGTH:     POSTURE: rounded shoulders, forward head, and weight shift right   PALPATION: NA secondary to surgical dressing in place   LOWER EXTREMITY ROM:   Active ROM Right eval Left eval  Hip flexion      Hip extension      Hip abduction      Hip adduction      Hip internal rotation      Hip external rotation      Knee flexion      Knee extension      Ankle dorsiflexion      Ankle plantarflexion      Ankle inversion      Ankle eversion       (Blank rows = not tested)   LOWER EXTREMITY MMT:   MMT Right eval Left eval  Hip flexion 57 105  Hip extension -4 0  Hip abduction 18 25  Hip adduction 0 10  Hip internal rotation      Hip external rotation      Knee flexion      Knee extension      Ankle dorsiflexion      Ankle plantarflexion      Ankle inversion      Ankle eversion       (Blank rows = not tested)   LOWER EXTREMITY SPECIAL TESTS:  Hip special tests: NT secondary to post op    FUNCTIONAL TESTS:  5 times sit to stand: 12.58 sec from slightly elevated plinth with B UE onto knees, no ADs Timed up and go (TUG): NT secondary to patient unable to be comfortable in standard chair  2 minute walk test: 220 feet with SPC   GAIT: Distance walked: 220 feet Assistive device utilized: Single point cane Level of assistance: SBA Comments: Equal stride length, slow cadence, minimal weight shift to left stance with cane with mod R UE pressure       TODAY'S TREATMENT: 06/01/22  Rec bike (seat 17) 5 min warmup for AROM  Step up 6 inc 2 x 10 HHA x 1 Side step over 6 inch box 2 x 10 HHA x 1 Step down 6 inch 2 x 10 HHA x 1  Weighted walkouts 4 plates x10  (retro/ side/ side) Single leg stance 2 x 15 sec with int HHA  Leg press 3 plates 2 x 10    [id number] Nustep seat 11 level 3 x 5 min for  strength and ROM  Standing: Heel/toe raises 2 x 10 Slant board 5 x 20" 6" box step ups with 1 UE assist 2 x 10 6" lateral box steps bil UE assist 2 x 10 6" step down 2 x 10 HHA x 1 Tandem stance 2 x 20" each way  Body craft weighted walk outs 4 plates x 5  Green tband around knees sidestepping // bars down and back x 5  4" box step downs (heel tap) 2 x 10   05/26/22 Nustep seat 11 level 3 x 5 min for strength and ROM  Standing: 6" box step ups with 1 UE assist 2 x 10 6" lateral box steps bil UE assist 2 x 10 6" step down 2 x 10 HHA x 1 Hip abduction GTB x20 ea Hip extension GTB 2 x10 ea Partial lunge at 7 inch step front LE elevated 2x 10 HHA x 2 Weighted retro-walk 4 plates x10   Balance: SLS fingertip touch x1 support, to no UE support 5x10"   Gait training level surface ~ 200' no AD    PATIENT EDUCATION:  Education details: 05/11/22 - evaluation findings, POC, PT scope of practice, post operative healing time frames, ice recovery at home, cont elevation and pain management, HEP as below Person educated: Patient Education method: Explanation, Demonstration, and Handouts Education comprehension: verbalized understanding     HOME EXERCISE PROGRAM:  05/28/22 tandem stance Access Code: [email or web address] URL: [email or web address]  05/24/22 - Standing Hip Abduction with Resistance at Ankles and Counter Support  - 2 x daily - 7 x weekly - 3 sets - 10 reps - Standing Hip Extension with Resistance at Ankles and Counter Support  - 2 x daily - 7 x weekly - 3 sets - 10 reps  05/17/22 - Standing Knee Flexion with Unilateral Counter Support  - 2 x daily - 7 x weekly - 2 sets - 10 reps - Standing Single Leg Stance with Counter Support  - 2 x daily - 7 x weekly - 1 sets - 5 reps - 10 second hold Date: 05/11/2022 Prepared by: Jerilynn Som   Exercises - Standing Heel Raises  - 2 x daily - 7 x weekly - 3 sets - 10 reps - Standing Hip Abduction with Counter Support  - 2 x daily - 7 x weekly - 3 sets - 10 reps - Standing Hip Extension with Counter Support  - 2 x daily - 7 x weekly - 3 sets - 10 reps - Mini Squat with Counter Support  - 2 x daily - 7 x weekly - 3 sets - 10 reps   ASSESSMENT:   CLINICAL IMPRESSION: Patient demos ongoing gait deviation despite good hip ROM and strength. He is now able to balance on LLE for up to 10 seconds unsupported. Shows good tolerance with resisted walking back ward and laterally. Added leg pressing for LE strength progression. Patient will continue to benefit from skilled therapy services to reduce remaining deficits and improve functional ability.    OBJECTIVE IMPAIRMENTS Abnormal gait, decreased activity tolerance, decreased balance, decreased endurance, decreased mobility, difficulty walking, decreased ROM, decreased strength, increased edema, increased fascial restrictions, impaired flexibility, improper body mechanics, and pain.    ACTIVITY LIMITATIONS carrying, lifting, bending, sitting, standing, squatting, sleeping, stairs, bathing, toileting, dressing, and locomotion level   PARTICIPATION LIMITATIONS: meal prep, cleaning, laundry, community activity, and yard work   PERSONAL FACTORS 1-2 comorbidities: DM, B knee arthritis  are also affecting patient's functional outcome.  REHAB POTENTIAL: Good   CLINICAL DECISION MAKING: Stable/uncomplicated   EVALUATION COMPLEXITY: Low     GOALS: Goals reviewed with patient? No   SHORT TERM GOALS: Target date: 06/01/2022  Patient will be independent with initial HEP and self-management strategies to improve functional outcomes Baseline: 05/11/22 - initiated today Goal status: in progress   2.  Patient to be able to demonstrate improve L hip AROM to hip flexion max 90 deg for ability to sit into standard chair Baseline: 05/11/22 -  0-57 today Goal status: in progress   3.  Patient to be able to demonstrate improved ambulation with no AD, equal WB, and continued equal stride length. Baseline:  Goal status: in progress       LONG TERM GOALS: Target date: 06/29/2022    Patient will be independent with advanced HEP and self-management strategies to improve functional outcomes   Baseline: to be established Goal status: in progress   2.  Patient will be able to demonstrate improved function in transitions by preforming 5 times sit to stand in less than 11 seconds with no UE use and equal WB from a standard chair Baseline: 05/11/22 - 12.5 sec with max weight on R Le Goal status: in progress   3.  Patient to demonstrate improved function by increasing FOTO functional score to at least 70.  Baseline: 05/11/22 - 58 current Goal status: in progress   4.  Patient to be able to ambulate at least 350 feet with no ADs and no pain for improved functional community ambulation.  Baseline: 05/11/22 - 220 feet with SPC Goal status: in progress     PLAN: PT FREQUENCY: 3x/week   PT DURATION: 6 weeks   PLANNED INTERVENTIONS: Therapeutic exercises, Therapeutic activity, Neuromuscular re-education, Balance training, Gait training, Patient/Family education, Self Care, Joint mobilization, Stair training, DME instructions, Cryotherapy, scar mobilization, Taping, Manual therapy, and Re-evaluation   PLAN FOR NEXT SESSION: Build functional strengthening while improving L hip ROM. Continue work to normalize gait.     1:54 PM, 06/01/22 Georges Lynch PT DPT  Physical Therapist with [facility]  [phone]

## 2022-06-03 ENCOUNTER — Ambulatory Visit (HOSPITAL_COMMUNITY): Payer: Medicare HMO | Admitting: Physical Therapy

## 2022-06-03 ENCOUNTER — Encounter (HOSPITAL_COMMUNITY): Payer: Self-pay | Admitting: Physical Therapy

## 2022-06-03 DIAGNOSIS — Z96642 Presence of left artificial hip joint: Secondary | ICD-10-CM | POA: Diagnosis not present

## 2022-06-03 DIAGNOSIS — R262 Difficulty in walking, not elsewhere classified: Secondary | ICD-10-CM | POA: Diagnosis not present

## 2022-06-03 DIAGNOSIS — M25552 Pain in left hip: Secondary | ICD-10-CM | POA: Diagnosis not present

## 2022-06-03 DIAGNOSIS — M25652 Stiffness of left hip, not elsewhere classified: Secondary | ICD-10-CM

## 2022-06-03 NOTE — Therapy (Signed)
OUTPATIENT PHYSICAL THERAPY TREATMENT NOTE   Patient Name: [name] MRN: [id number] DOB:06-21-1956, 66 y.o., male Today's Date: 06/03/2022  PCP: Sharilyn Sites, MD REFERRING PROVIDER: Carole Civil, MD   Progress Note Reporting Period 05/11/22 to 06/03/22  See note below for Objective Data and Assessment of Progress/Goals.     END OF SESSION:   PT End of Session - 06/03/22 1339     Visit Number 10    Number of Visits 19   eval + 3 x 6   Date for PT Re-Evaluation 06/29/22   3x6 weeks, extra week given for mid-week eval and make up time   Authorization Type [employer]    Authorization Time Period 12 approved 8/17-10/3/23    Authorization - Visit Number 9    Authorization - Number of Visits 12    Progress Note Due on Visit 59    PT Start Time 1340    PT Stop Time 1420    PT Time Calculation (min) 40 min    Equipment Utilized During Treatment Other (comment)   patient's SPC   Activity Tolerance Patient tolerated treatment well    Behavior During Therapy WFL for tasks assessed/performed               Past Medical History:  Diagnosis Date   CAD, multiple vessel 10/23/2014   Chronic back pain    Chronic hip pain    Diabetes mellitus    DM2 (diabetes mellitus, type 2) ([place]) 10/23/2014   GERD (gastroesophageal reflux disease)    Heart attack ([place])    Hyperlipidemia    Hyperlipidemia LDL goal <70 10/23/2014   Hypertension    Tobacco use, stopped 6 months ago  10/23/2014   Past Surgical History:  Procedure Laterality Date   LEFT HEART CATHETERIZATION WITH CORONARY ANGIOGRAM N/A 10/22/2014   Procedure: LEFT HEART CATHETERIZATION WITH CORONARY ANGIOGRAM;  Surgeon: Jettie Booze, MD;  Location: [facility] CATH LAB;  Service: Cardiovascular;  Laterality: N/A;   PERCUTANEOUS CORONARY STENT INTERVENTION (PCI-S)  10/22/2014   Procedure: PERCUTANEOUS CORONARY STENT INTERVENTION (PCI-S);  Surgeon: Jettie Booze, MD;  Location: [facility] CATH LAB;  Service:  Cardiovascular;;  x3 to prox RCA , Prox CFX and dist CFX   TOTAL HIP ARTHROPLASTY Right    TOTAL HIP ARTHROPLASTY Left 04/27/2022   Procedure: TOTAL HIP ARTHROPLASTY;  Surgeon: Carole Civil, MD;  Location: [facility] ORS;  Service: Orthopedics;  Laterality: Left;   Patient Active Problem List   Diagnosis Date Noted   Osteoarthritis of left hip 04/27/2022   Unilateral primary osteoarthritis, left hip    CAD, multiple vessel, s/p DES to [place] and dLCX, and pRCA 10/22/14 10/23/2014   DM2 (diabetes mellitus, type 2) ([place]) 10/23/2014   Tobacco use, stopped 6 months ago  10/23/2014   GERD (gastroesophageal reflux disease) 10/23/2014   Hyperlipidemia LDL goal <70 10/23/2014   NSTEMI (non-ST elevated myocardial infarction) ([place]) 10/22/2014    REFERRING DIAG: O17.51 (ICD-10-CM) - Primary osteoarthritis of left hip  evaluate and treat left hip 3x weekly for 6 weeks needs to begin therapy around 05/10/22 date of surgery 04/27/22  lateral approach  THERAPY DIAG:  Stiffness of left hip, not elsewhere classified  Difficulty in walking, not elsewhere classified  S/P hip replacement, left  Pain in left hip  Rationale for Evaluation and Treatment Rehabilitation  PERTINENT HISTORY: Arthritis B knees, chronic knee pain, chronic hip pain, right hip replaced over 10 years ago  PRECAUTIONS: Posterior hip - patient discusses  understanding    SUBJECTIVE: Was a little sore after last time. Was working in the yard a little this morning. Reports at least 50-60% improved since starting therapy.   PAIN:  Are you having pain? Yes: NPRS scale: 2/10 Pain location: LT lateral thigh  Pain description: achy Aggravating factors: colder temps, positioning, sleep Relieving factors: aspirin, gaba, one oxy as need , ice after movement as needed    OBJECTIVE:    DIAGNOSTIC FINDINGS: DG of pelvis post op with quoted report below =  "FINDINGS: Status post left hip arthroplasty with expected overlying postoperative  change. No evidence of perihardware fracture or component malpositioning. Pre-existing right hip arthroplasty."     PATIENT SURVEYS:  FOTO 58   COGNITION:           Overall cognitive status: Within functional limits for tasks assessed                          SENSATION: WFL   EDEMA:  Bandage present, patient reports edema, and some fluid/bleeding in region; to be assessed tomorrow at MD   MUSCLE LENGTH:     POSTURE: rounded shoulders, forward head, and weight shift right   PALPATION: NA secondary to surgical dressing in place   LOWER EXTREMITY ROM:   Active ROM Right eval Left eval Left 06/03/22  Hip flexion  105 57  85  Hip extension  0 -4  2  Hip abduction  25 18    Hip adduction  10  0   Hip internal rotation       Hip external rotation       Knee flexion       Knee extension       Ankle dorsiflexion       Ankle plantarflexion       Ankle inversion       Ankle eversion        (Blank rows = not tested)   LOWER EXTREMITY MMT:   MMT Right eval Left eval RT 06/03/22 LT 06/03/22  Hip flexion   5 5  Hip extension   5 4+  Hip abduction   4+ 4-  Hip adduction      Hip internal rotation        Hip external rotation        Knee flexion        Knee extension     5 5  Ankle dorsiflexion     5 5  Ankle plantarflexion        Ankle inversion        Ankle eversion         (Blank rows = not tested)   LOWER EXTREMITY SPECIAL TESTS:  Hip special tests: NT secondary to post op    FUNCTIONAL TESTS:  5 times sit to stand: 9.64 sec with no UE, but favors RLE  (was 12.58 sec from slightly elevated plinth with B UE onto knees, no Ads) 2 minute walk test: 470 feet with SPC (was 220 feet with SPC)   GAIT: Distance walked: 470 feet Assistive device utilized: Single point cane Level of assistance: SBA Comments: Equal stride length, slow cadence, minimal weight shift to left stance with cane with mod R UE pressure       TODAY'S TREATMENT: 06/03/22 FOTO AROM MMT 5 x  STS 2 MWT   Weighted walkouts 4 plates x10 (retro/ side/ side) Partial lunge 2 x 10 each (lead foot on 7  inch step)   06/01/22  Rec bike (seat 17) 5 min warmup for AROM  Step up 6 inc 2 x 10 HHA x 1 Side step over 6 inch box 2 x 10 HHA x 1 Step down 6 inch 2 x 10 HHA x 1  Weighted walkouts 4 plates x10 (retro/ side/ side) Single leg stance 2 x 15 sec with int HHA  Leg press 3 plates 2 x 10    0/7/86 Nustep seat 11 level 3 x 5 min for strength and ROM  Standing: Heel/toe raises 2 x 10 Slant board 5 x 20" 6" box step ups with 1 UE assist 2 x 10 6" lateral box steps bil UE assist 2 x 10 6" step down 2 x 10 HHA x 1 Tandem stance 2 x 20" each way  Body craft weighted walk outs 4 plates x 5  Green tband around knees sidestepping // bars down and back x 5  4" box step downs (heel tap) 2 x 10   05/26/22 Nustep seat 11 level 3 x 5 min for strength and ROM  Standing: 6" box step ups with 1 UE assist 2 x 10 6" lateral box steps bil UE assist 2 x 10 6" step down 2 x 10 HHA x 1 Hip abduction GTB x20 ea Hip extension GTB 2 x10 ea Partial lunge at 7 inch step front LE elevated 2x 10 HHA x 2 Weighted retro-walk 4 plates x10   Balance: SLS fingertip touch x1 support, to no UE support 5x10"   Gait training level surface ~ 200' no AD    PATIENT EDUCATION:  Education details: 05/11/22 - evaluation findings, POC, PT scope of practice, post operative healing time frames, ice recovery at home, cont elevation and pain management, HEP as below Person educated: Patient Education method: Explanation, Demonstration, and Handouts Education comprehension: verbalized understanding     HOME EXERCISE PROGRAM:  05/28/22 tandem stance Access Code: [email or web address] URL: [email or web address]  05/24/22 - Standing Hip Abduction with Resistance at Ankles and Counter Support  - 2 x daily - 7 x weekly - 3 sets - 10 reps - Standing Hip Extension with Resistance at Ankles and Counter  Support  - 2 x daily - 7 x weekly - 3 sets - 10 reps  05/17/22 - Standing Knee Flexion with Unilateral Counter Support  - 2 x daily - 7 x weekly - 2 sets - 10 reps - Standing Single Leg Stance with Counter Support  - 2 x daily - 7 x weekly - 1 sets - 5 reps - 10 second hold Date: 05/11/2022 Prepared by: Jerilynn Som   Exercises - Standing Heel Raises  - 2 x daily - 7 x weekly - 3 sets - 10 reps - Standing Hip Abduction with Counter Support  - 2 x daily - 7 x weekly - 3 sets - 10 reps - Standing Hip Extension with Counter Support  - 2 x daily - 7 x weekly - 3 sets - 10 reps - Mini Squat with Counter Support  - 2 x daily - 7 x weekly - 3 sets - 10 reps   ASSESSMENT:   CLINICAL IMPRESSION: Patient making good progress to therapy goals. Good ROM and MMT gains. Pain well managed. He hs mild fascial restriction about lateral thigh and demos gait deviation per faulty gait mechanics. He does not report pain or weakness while walking. Patient will continue to benefit from skilled therapy services to progress functional strength, ROM and improve gait  mechanics for improved ability to perform ADLs.   OBJECTIVE IMPAIRMENTS Abnormal gait, decreased activity tolerance, decreased balance, decreased endurance, decreased mobility, difficulty walking, decreased ROM, decreased strength, increased edema, increased fascial restrictions, impaired flexibility, improper body mechanics, and pain.    ACTIVITY LIMITATIONS carrying, lifting, bending, sitting, standing, squatting, sleeping, stairs, bathing, toileting, dressing, and locomotion level   PARTICIPATION LIMITATIONS: meal prep, cleaning, laundry, community activity, and yard work   PERSONAL FACTORS 1-2 comorbidities: DM, B knee arthritis  are also affecting patient's functional outcome.    REHAB POTENTIAL: Good   CLINICAL DECISION MAKING: Stable/uncomplicated   EVALUATION COMPLEXITY: Low     GOALS: Goals reviewed with patient? No   SHORT TERM  GOALS: Target date: 06/01/2022  Patient will be independent with initial HEP and self-management strategies to improve functional outcomes Baseline: 05/11/22 - initiated today Goal status: in progress   2.  Patient to be able to demonstrate improve L hip AROM to hip flexion max 90 deg for ability to sit into standard chair Baseline: Current: 2- 85 deg Goal status: in progress   3.  Patient to be able to demonstrate improved ambulation with no AD, equal WB, and continued equal stride length. Baseline: Able to walk with no AD, but with decreased stance on LLE  Goal status: in progress       LONG TERM GOALS: Target date: 06/29/2022    Patient will be independent with advanced HEP and self-management strategies to improve functional outcomes   Baseline: to be established Goal status: in progress   2.  Patient will be able to demonstrate improved function in transitions by preforming 5 times sit to stand in less than 11 seconds with no UE use and equal WB from a standard chair Baseline: 9.64 sec with no UE, but favors RLE  Goal status: Partially MET   3.  Patient to demonstrate improved function by increasing FOTO functional score to at least 70%.  Baseline: 67% function  Goal status: in progress   4.  Patient to be able to ambulate at least 350 feet with no ADs and no pain for improved functional community ambulation.  Baseline: 470 feet with SPC  Goal status: in progress     PLAN: PT FREQUENCY: 3x/week   PT DURATION: 6 weeks   PLANNED INTERVENTIONS: Therapeutic exercises, Therapeutic activity, Neuromuscular re-education, Balance training, Gait training, Patient/Family education, Self Care, Joint mobilization, Stair training, DME instructions, Cryotherapy, scar mobilization, Taping, Manual therapy, and Re-evaluation   PLAN FOR NEXT SESSION: Build functional strengthening while improving L hip ROM. Continue work to normalize gait.     1:40 PM, 06/03/22 Josue Hector PT DPT   Physical Therapist with [facility]  [phone]

## 2022-06-07 ENCOUNTER — Ambulatory Visit (HOSPITAL_COMMUNITY): Payer: Medicare HMO | Admitting: Physical Therapy

## 2022-06-07 DIAGNOSIS — M25552 Pain in left hip: Secondary | ICD-10-CM | POA: Diagnosis not present

## 2022-06-07 DIAGNOSIS — M25652 Stiffness of left hip, not elsewhere classified: Secondary | ICD-10-CM

## 2022-06-07 DIAGNOSIS — R262 Difficulty in walking, not elsewhere classified: Secondary | ICD-10-CM | POA: Diagnosis not present

## 2022-06-07 DIAGNOSIS — Z96642 Presence of left artificial hip joint: Secondary | ICD-10-CM | POA: Diagnosis not present

## 2022-06-07 NOTE — Therapy (Signed)
OUTPATIENT PHYSICAL THERAPY TREATMENT NOTE   Patient Name: [name] MRN: [id number] DOB:1956/04/10, 66 y.o., male Today's Date: 06/07/2022  PCP: Sharilyn Sites, MD REFERRING PROVIDER: Carole Civil, MD    See note below for Objective Data and Assessment of Progress/Goals.     END OF SESSION:   PT End of Session - 06/07/22 1307     Visit Number 11    Number of Visits 19   eval + 3 x 6   Date for PT Re-Evaluation 06/29/22   3x6 weeks, extra week given for mid-week eval and make up time   Authorization Type [employer]    Authorization Time Period 12 approved 8/17-10/3/23    Authorization - Visit Number 10    Authorization - Number of Visits 12    Progress Note Due on Visit 77    PT Start Time 1304    PT Stop Time 1343    PT Time Calculation (min) 39 min    Equipment Utilized During Treatment Other (comment)   patient's SPC   Activity Tolerance Patient tolerated treatment well    Behavior During Therapy WFL for tasks assessed/performed               Past Medical History:  Diagnosis Date   CAD, multiple vessel 10/23/2014   Chronic back pain    Chronic hip pain    Diabetes mellitus    DM2 (diabetes mellitus, type 2) ([place]) 10/23/2014   GERD (gastroesophageal reflux disease)    Heart attack ([place])    Hyperlipidemia    Hyperlipidemia LDL goal <70 10/23/2014   Hypertension    Tobacco use, stopped 6 months ago  10/23/2014   Past Surgical History:  Procedure Laterality Date   LEFT HEART CATHETERIZATION WITH CORONARY ANGIOGRAM N/A 10/22/2014   Procedure: LEFT HEART CATHETERIZATION WITH CORONARY ANGIOGRAM;  Surgeon: Jettie Booze, MD;  Location: [facility] CATH LAB;  Service: Cardiovascular;  Laterality: N/A;   PERCUTANEOUS CORONARY STENT INTERVENTION (PCI-S)  10/22/2014   Procedure: PERCUTANEOUS CORONARY STENT INTERVENTION (PCI-S);  Surgeon: Jettie Booze, MD;  Location: [facility] CATH LAB;  Service: Cardiovascular;;  x3 to prox RCA , Prox CFX and dist CFX    TOTAL HIP ARTHROPLASTY Right    TOTAL HIP ARTHROPLASTY Left 04/27/2022   Procedure: TOTAL HIP ARTHROPLASTY;  Surgeon: Carole Civil, MD;  Location: [facility] ORS;  Service: Orthopedics;  Laterality: Left;   Patient Active Problem List   Diagnosis Date Noted   Osteoarthritis of left hip 04/27/2022   Unilateral primary osteoarthritis, left hip    CAD, multiple vessel, s/p DES to [place] and dLCX, and pRCA 10/22/14 10/23/2014   DM2 (diabetes mellitus, type 2) ([place]) 10/23/2014   Tobacco use, stopped 6 months ago  10/23/2014   GERD (gastroesophageal reflux disease) 10/23/2014   Hyperlipidemia LDL goal <70 10/23/2014   NSTEMI (non-ST elevated myocardial infarction) ([employer] [place]) 10/22/2014    REFERRING DIAG: X93.71 (ICD-10-CM) - Primary osteoarthritis of left hip  evaluate and treat left hip 3x weekly for 6 weeks needs to begin therapy around 05/10/22 date of surgery 04/27/22  lateral approach  THERAPY DIAG:  Stiffness of left hip, not elsewhere classified  Difficulty in walking, not elsewhere classified  S/P hip replacement, left  Pain in left hip  Rationale for Evaluation and Treatment Rehabilitation  PERTINENT HISTORY: Arthritis B knees, chronic knee pain, chronic hip pain, right hip replaced over 10 years ago  PRECAUTIONS: Posterior hip - patient discusses understanding    SUBJECTIVE: Having some  aching in groin area today, not sure why.   PAIN:  Are you having pain? Yes: NPRS scale: 2/10 Pain location: LT groin  Pain description: achy Aggravating factors: colder temps, positioning, sleep Relieving factors: aspirin, gaba, one oxy as need , ice after movement as needed    OBJECTIVE:    DIAGNOSTIC FINDINGS: DG of pelvis post op with quoted report below =  "FINDINGS: Status post left hip arthroplasty with expected overlying postoperative change. No evidence of perihardware fracture or component malpositioning. Pre-existing right hip arthroplasty."     PATIENT SURVEYS:  FOTO 58    COGNITION:           Overall cognitive status: Within functional limits for tasks assessed                          SENSATION: WFL   EDEMA:  Bandage present, patient reports edema, and some fluid/bleeding in region; to be assessed tomorrow at MD   MUSCLE LENGTH:     POSTURE: rounded shoulders, forward head, and weight shift right   PALPATION: NA secondary to surgical dressing in place   LOWER EXTREMITY ROM:   Active ROM Right eval Left eval Left 06/03/22  Hip flexion  105 57  85  Hip extension  0 -4  2  Hip abduction  25 18    Hip adduction  10  0   Hip internal rotation       Hip external rotation       Knee flexion       Knee extension       Ankle dorsiflexion       Ankle plantarflexion       Ankle inversion       Ankle eversion        (Blank rows = not tested)   LOWER EXTREMITY MMT:   MMT Right eval Left eval RT 06/03/22 LT 06/03/22  Hip flexion   5 5  Hip extension   5 4+  Hip abduction   4+ 4-  Hip adduction      Hip internal rotation        Hip external rotation        Knee flexion        Knee extension     5 5  Ankle dorsiflexion     5 5  Ankle plantarflexion        Ankle inversion        Ankle eversion         (Blank rows = not tested)   LOWER EXTREMITY SPECIAL TESTS:  Hip special tests: NT secondary to post op    FUNCTIONAL TESTS:  5 times sit to stand: 9.64 sec with no UE, but favors RLE  (was 12.58 sec from slightly elevated plinth with B UE onto knees, no Ads) 2 minute walk test: 470 feet with SPC (was 220 feet with SPC)   GAIT: Distance walked: 470 feet Assistive device utilized: Single point cane Level of assistance: SBA Comments: Equal stride length, slow cadence, minimal weight shift to left stance with cane with mod R UE pressure       TODAY'S TREATMENT: 06/07/22 Nustep seat 11 level 3 x 5 min for strength and ROM  6" box step ups with 1 UE assist 2 x 10 6" lateral box steps bil UE assist 2 x 10 6" step down 2 x 10 HHA x 1 6  inch step taps no HHA  x20  Mini squats 2 x 10  Hip flexion/ abduction/ extension with 3 lb weights 2 x 10 each  Clinic amb 1 RT no AD Retro walk on TM 66mh 3 min for sequencing    Body craft weighted walk outs 4 plates x 5 FWD/ Side/ Side    06/03/22 FOTO AROM MMT 5 x STS 2 MWT   Weighted walkouts 4 plates x10 (retro/ side/ side) Partial lunge 2 x 10 each (lead foot on 7 inch step)   06/01/22  Rec bike (seat 17) 5 min warmup for AROM  Step up 6 inc 2 x 10 HHA x 1 Side step over 6 inch box 2 x 10 HHA x 1 Step down 6 inch 2 x 10 HHA x 1  Weighted walkouts 4 plates x10 (retro/ side/ side) Single leg stance 2 x 15 sec with int HHA  Leg press 3 plates 2 x 10    PATIENT EDUCATION:  Education details: 05/11/22 - evaluation findings, POC, PT scope of practice, post operative healing time frames, ice recovery at home, cont elevation and pain management, HEP as below Person educated: Patient Education method: Explanation, Demonstration, and Handouts Education comprehension: verbalized understanding     HOME EXERCISE PROGRAM:  05/28/22 tandem stance Access Code: [email or web address] URL: [email or web address]  05/24/22 - Standing Hip Abduction with Resistance at Ankles and Counter Support  - 2 x daily - 7 x weekly - 3 sets - 10 reps - Standing Hip Extension with Resistance at Ankles and Counter Support  - 2 x daily - 7 x weekly - 3 sets - 10 reps  05/17/22 - Standing Knee Flexion with Unilateral Counter Support  - 2 x daily - 7 x weekly - 2 sets - 10 reps - Standing Single Leg Stance with Counter Support  - 2 x daily - 7 x weekly - 1 sets - 5 reps - 10 second hold Date: 05/11/2022 Prepared by: PJerilynn Som  Exercises - Standing Heel Raises  - 2 x daily - 7 x weekly - 3 sets - 10 reps - Standing Hip Abduction with Counter Support  - 2 x daily - 7 x weekly - 3 sets - 10 reps - Standing Hip Extension with Counter Support  - 2 x daily - 7 x weekly - 3 sets - 10 reps - Mini  Squat with Counter Support  - 2 x daily - 7 x weekly - 3 sets - 10 reps   ASSESSMENT:   CLINICAL IMPRESSION: Patient continues to demo gait deviation and decreased stance on LLE. Added retro walk on TM for improved mechanics and sequencing. Ongoing cues provided for even stride and improved gait mechanics. Patient will continue to benefit from skilled therapy services to reduce remaining deficits and improve functional ability.    OBJECTIVE IMPAIRMENTS Abnormal gait, decreased activity tolerance, decreased balance, decreased endurance, decreased mobility, difficulty walking, decreased ROM, decreased strength, increased edema, increased fascial restrictions, impaired flexibility, improper body mechanics, and pain.    ACTIVITY LIMITATIONS carrying, lifting, bending, sitting, standing, squatting, sleeping, stairs, bathing, toileting, dressing, and locomotion level   PARTICIPATION LIMITATIONS: meal prep, cleaning, laundry, community activity, and yard work   PERSONAL FACTORS 1-2 comorbidities: DM, B knee arthritis  are also affecting patient's functional outcome.    REHAB POTENTIAL: Good   CLINICAL DECISION MAKING: Stable/uncomplicated   EVALUATION COMPLEXITY: Low     GOALS: Goals reviewed with patient? No   SHORT TERM GOALS: Target date: 06/01/2022  Patient will be independent with  initial HEP and self-management strategies to improve functional outcomes Baseline: 05/11/22 - initiated today Goal status: in progress   2.  Patient to be able to demonstrate improve L hip AROM to hip flexion max 90 deg for ability to sit into standard chair Baseline: Current: 2- 85 deg Goal status: in progress   3.  Patient to be able to demonstrate improved ambulation with no AD, equal WB, and continued equal stride length. Baseline: Able to walk with no AD, but with decreased stance on LLE  Goal status: in progress       LONG TERM GOALS: Target date: 06/29/2022    Patient will be independent with  advanced HEP and self-management strategies to improve functional outcomes   Baseline: to be established Goal status: in progress   2.  Patient will be able to demonstrate improved function in transitions by preforming 5 times sit to stand in less than 11 seconds with no UE use and equal WB from a standard chair Baseline: 9.64 sec with no UE, but favors RLE  Goal status: Partially MET   3.  Patient to demonstrate improved function by increasing FOTO functional score to at least 70%.  Baseline: 67% function  Goal status: in progress   4.  Patient to be able to ambulate at least 350 feet with no ADs and no pain for improved functional community ambulation.  Baseline: 470 feet with SPC  Goal status: in progress     PLAN: PT FREQUENCY: 3x/week   PT DURATION: 6 weeks   PLANNED INTERVENTIONS: Therapeutic exercises, Therapeutic activity, Neuromuscular re-education, Balance training, Gait training, Patient/Family education, Self Care, Joint mobilization, Stair training, DME instructions, Cryotherapy, scar mobilization, Taping, Manual therapy, and Re-evaluation   PLAN FOR NEXT SESSION: Build functional strengthening while improving L hip ROM. Continue work to normalize gait. Gait trainer on TM    1:07 PM, 06/07/22 Josue Hector PT DPT  Physical Therapist with [facility]  [phone]

## 2022-06-09 ENCOUNTER — Ambulatory Visit (HOSPITAL_COMMUNITY): Payer: Medicare HMO | Admitting: Physical Therapy

## 2022-06-09 ENCOUNTER — Ambulatory Visit (INDEPENDENT_AMBULATORY_CARE_PROVIDER_SITE_OTHER): Payer: Medicare HMO | Admitting: Orthopedic Surgery

## 2022-06-09 DIAGNOSIS — Z96642 Presence of left artificial hip joint: Secondary | ICD-10-CM

## 2022-06-09 DIAGNOSIS — M25652 Stiffness of left hip, not elsewhere classified: Secondary | ICD-10-CM | POA: Diagnosis not present

## 2022-06-09 DIAGNOSIS — R262 Difficulty in walking, not elsewhere classified: Secondary | ICD-10-CM | POA: Diagnosis not present

## 2022-06-09 DIAGNOSIS — M25552 Pain in left hip: Secondary | ICD-10-CM | POA: Diagnosis not present

## 2022-06-09 NOTE — Progress Notes (Signed)
FOLLOW UP   Encounter Diagnosis  Name Primary?   Status post left hip replacement Yes     Chief Complaint  Patient presents with   Post-op Follow-up    Left THA DOS 04/27/22 -doing good    66 year old male is 6 weeks postop from his left total hip he is doing well he is using a cane.  He does complain of some abductor weakness  His leg control is very good  Recommend continued physical therapy work on abductor strengthening return in 6 weeks

## 2022-06-09 NOTE — Therapy (Signed)
OUTPATIENT PHYSICAL THERAPY TREATMENT NOTE   Patient Name: [name] MRN: [id number] DOB:01/06/1956, 66 y.o., male Today's Date: 06/09/2022  PCP: Sharilyn Sites, MD REFERRING PROVIDER: Carole Civil, MD    See note below for Objective Data and Assessment of Progress/Goals.     END OF SESSION:   PT End of Session - 06/09/22 1355     Visit Number 12    Number of Visits 19   eval + 3 x 6   Date for PT Re-Evaluation 06/29/22   3x6 weeks, extra week given for mid-week eval and make up time   Authorization Type [employer]    Authorization Time Period 12 approved 8/17-10/3/23    Authorization - Visit Number 11    Authorization - Number of Visits 12    Progress Note Due on Visit 65    PT Start Time 1347    PT Stop Time 1427    PT Time Calculation (min) 40 min    Equipment Utilized During Treatment Other (comment)   patient's SPC   Activity Tolerance Patient tolerated treatment well    Behavior During Therapy WFL for tasks assessed/performed               Past Medical History:  Diagnosis Date   CAD, multiple vessel 10/23/2014   Chronic back pain    Chronic hip pain    Diabetes mellitus    DM2 (diabetes mellitus, type 2) ([place]) 10/23/2014   GERD (gastroesophageal reflux disease)    Heart attack ([place])    Hyperlipidemia    Hyperlipidemia LDL goal <70 10/23/2014   Hypertension    Tobacco use, stopped 6 months ago  10/23/2014   Past Surgical History:  Procedure Laterality Date   LEFT HEART CATHETERIZATION WITH CORONARY ANGIOGRAM N/A 10/22/2014   Procedure: LEFT HEART CATHETERIZATION WITH CORONARY ANGIOGRAM;  Surgeon: Jettie Booze, MD;  Location: [facility] CATH LAB;  Service: Cardiovascular;  Laterality: N/A;   PERCUTANEOUS CORONARY STENT INTERVENTION (PCI-S)  10/22/2014   Procedure: PERCUTANEOUS CORONARY STENT INTERVENTION (PCI-S);  Surgeon: Jettie Booze, MD;  Location: [facility] CATH LAB;  Service: Cardiovascular;;  x3 to prox RCA , Prox CFX and dist CFX    TOTAL HIP ARTHROPLASTY Right    TOTAL HIP ARTHROPLASTY Left 04/27/2022   Procedure: TOTAL HIP ARTHROPLASTY;  Surgeon: Carole Civil, MD;  Location: [facility] ORS;  Service: Orthopedics;  Laterality: Left;   Patient Active Problem List   Diagnosis Date Noted   Osteoarthritis of left hip 04/27/2022   Unilateral primary osteoarthritis, left hip    CAD, multiple vessel, s/p DES to [place] and dLCX, and pRCA 10/22/14 10/23/2014   DM2 (diabetes mellitus, type 2) ([place]) 10/23/2014   Tobacco use, stopped 6 months ago  10/23/2014   GERD (gastroesophageal reflux disease) 10/23/2014   Hyperlipidemia LDL goal <70 10/23/2014   NSTEMI (non-ST elevated myocardial infarction) ([place]) 10/22/2014    REFERRING DIAG: K06.77 (ICD-10-CM) - Primary osteoarthritis of left hip  evaluate and treat left hip 3x weekly for 6 weeks needs to begin therapy around 05/10/22 date of surgery 04/27/22  lateral approach  THERAPY DIAG:  Stiffness of left hip, not elsewhere classified  Difficulty in walking, not elsewhere classified  S/P hip replacement, left  Pain in left hip  Rationale for Evaluation and Treatment Rehabilitation  PERTINENT HISTORY: Arthritis B knees, chronic knee pain, chronic hip pain, right hip replaced over 10 years ago  PRECAUTIONS: Posterior hip - patient discusses understanding    SUBJECTIVE: Had f/u  with MD today. Went well. Doing good, slight pain in front of LT thigh.   PAIN:  Are you having pain? Yes: NPRS scale: 1/10 Pain location: LT groin  Pain description: achy Aggravating factors: colder temps, positioning, sleep Relieving factors: aspirin, gaba, one oxy as need , ice after movement as needed    OBJECTIVE:    DIAGNOSTIC FINDINGS: DG of pelvis post op with quoted report below =  "FINDINGS: Status post left hip arthroplasty with expected overlying postoperative change. No evidence of perihardware fracture or component malpositioning. Pre-existing right hip arthroplasty."      PATIENT SURVEYS:  FOTO 58   COGNITION:           Overall cognitive status: Within functional limits for tasks assessed                          SENSATION: WFL   EDEMA:  Bandage present, patient reports edema, and some fluid/bleeding in region; to be assessed tomorrow at MD   MUSCLE LENGTH:     POSTURE: rounded shoulders, forward head, and weight shift right   PALPATION: NA secondary to surgical dressing in place   LOWER EXTREMITY ROM:   Active ROM Right eval Left eval Left 06/03/22  Hip flexion  105 57  85  Hip extension  0 -4  2  Hip abduction  25 18    Hip adduction  10  0   Hip internal rotation       Hip external rotation       Knee flexion       Knee extension       Ankle dorsiflexion       Ankle plantarflexion       Ankle inversion       Ankle eversion        (Blank rows = not tested)   LOWER EXTREMITY MMT:   MMT Right eval Left eval RT 06/03/22 LT 06/03/22  Hip flexion   5 5  Hip extension   5 4+  Hip abduction   4+ 4-  Hip adduction      Hip internal rotation        Hip external rotation        Knee flexion        Knee extension     5 5  Ankle dorsiflexion     5 5  Ankle plantarflexion        Ankle inversion        Ankle eversion         (Blank rows = not tested)   LOWER EXTREMITY SPECIAL TESTS:  Hip special tests: NT secondary to post op    FUNCTIONAL TESTS:  5 times sit to stand: 9.64 sec with no UE, but favors RLE  (was 12.58 sec from slightly elevated plinth with B UE onto knees, no Ads) 2 minute walk test: 470 feet with SPC (was 220 feet with SPC)   GAIT: Distance walked: 470 feet Assistive device utilized: Single point cane Level of assistance: SBA Comments: Equal stride length, slow cadence, minimal weight shift to left stance with cane with mod R UE pressure       TODAY'S TREATMENT: 06/09/22 TM 5 min up to 1.5 mph Retro walking on TM 4 min up to 1.2 mph Stairs 7 inch 5 RT reciprocal single hand rail  Band sidestepping RTB 6 RT   RTB monster walks 6 RT FEWD/ RETRO  SLS 3 x  10" Clinic amb 2 RT no AD    06/07/22 Nustep seat 11 level 3 x 5 min for strength and ROM  6" box step ups with 1 UE assist 2 x 10 6" lateral box steps bil UE assist 2 x 10 6" step down 2 x 10 HHA x 1 6 inch step taps no HHA  x20  Mini squats 2 x 10  Hip flexion/ abduction/ extension with 3 lb weights 2 x 10 each  Clinic amb 1 RT no AD Retro walk on TM 43mh 3 min for sequencing    Body craft weighted walk outs 4 plates x 5 FWD/ Side/ Side    06/03/22 FOTO AROM MMT 5 x STS 2 MWT   Weighted walkouts 4 plates x10 (retro/ side/ side) Partial lunge 2 x 10 each (lead foot on 7 inch step)   06/01/22  Rec bike (seat 17) 5 min warmup for AROM  Step up 6 inc 2 x 10 HHA x 1 Side step over 6 inch box 2 x 10 HHA x 1 Step down 6 inch 2 x 10 HHA x 1  Weighted walkouts 4 plates x10 (retro/ side/ side) Single leg stance 2 x 15 sec with int HHA  Leg press 3 plates 2 x 10    PATIENT EDUCATION:  Education details: 05/11/22 - evaluation findings, POC, PT scope of practice, post operative healing time frames, ice recovery at home, cont elevation and pain management, HEP as below Person educated: Patient Education method: Explanation, Demonstration, and Handouts Education comprehension: verbalized understanding     HOME EXERCISE PROGRAM:  05/28/22 tandem stance Access Code: [email or web address] URL: [email or web address]  05/24/22 - Standing Hip Abduction with Resistance at Ankles and Counter Support  - 2 x daily - 7 x weekly - 3 sets - 10 reps - Standing Hip Extension with Resistance at Ankles and Counter Support  - 2 x daily - 7 x weekly - 3 sets - 10 reps  05/17/22 - Standing Knee Flexion with Unilateral Counter Support  - 2 x daily - 7 x weekly - 2 sets - 10 reps - Standing Single Leg Stance with Counter Support  - 2 x daily - 7 x weekly - 1 sets - 5 reps - 10 second hold Date: 05/11/2022 Prepared by: PJerilynn Som  Exercises -  Standing Heel Raises  - 2 x daily - 7 x weekly - 3 sets - 10 reps - Standing Hip Abduction with Counter Support  - 2 x daily - 7 x weekly - 3 sets - 10 reps - Standing Hip Extension with Counter Support  - 2 x daily - 7 x weekly - 3 sets - 10 reps - Mini Squat with Counter Support  - 2 x daily - 7 x weekly - 3 sets - 10 reps   ASSESSMENT:   CLINICAL IMPRESSION: Patient showing improved function with activity. Progressed LE strength with added band resisted monster walks. Patient noting increased muscle fatigue in LT thigh. Ongoing gait deviations. Continued work on treadmill with FWD and retro walking for sequence and mechanics. Patient will continue to benefit from skilled therapy services to reduce remaining deficits and improve functional ability.   OBJECTIVE IMPAIRMENTS Abnormal gait, decreased activity tolerance, decreased balance, decreased endurance, decreased mobility, difficulty walking, decreased ROM, decreased strength, increased edema, increased fascial restrictions, impaired flexibility, improper body mechanics, and pain.    ACTIVITY LIMITATIONS carrying, lifting, bending, sitting, standing, squatting, sleeping, stairs, bathing, toileting, dressing, and locomotion level   PARTICIPATION  LIMITATIONS: meal prep, cleaning, laundry, community activity, and yard work   PERSONAL FACTORS 1-2 comorbidities: DM, B knee arthritis  are also affecting patient's functional outcome.    REHAB POTENTIAL: Good   CLINICAL DECISION MAKING: Stable/uncomplicated   EVALUATION COMPLEXITY: Low     GOALS: Goals reviewed with patient? No   SHORT TERM GOALS: Target date: 06/01/2022  Patient will be independent with initial HEP and self-management strategies to improve functional outcomes Baseline: 05/11/22 - initiated today Goal status: in progress   2.  Patient to be able to demonstrate improve L hip AROM to hip flexion max 90 deg for ability to sit into standard chair Baseline: Current: 2- 85  deg Goal status: in progress   3.  Patient to be able to demonstrate improved ambulation with no AD, equal WB, and continued equal stride length. Baseline: Able to walk with no AD, but with decreased stance on LLE  Goal status: in progress       LONG TERM GOALS: Target date: 06/29/2022    Patient will be independent with advanced HEP and self-management strategies to improve functional outcomes   Baseline: to be established Goal status: in progress   2.  Patient will be able to demonstrate improved function in transitions by preforming 5 times sit to stand in less than 11 seconds with no UE use and equal WB from a standard chair Baseline: 9.64 sec with no UE, but favors RLE  Goal status: Partially MET   3.  Patient to demonstrate improved function by increasing FOTO functional score to at least 70%.  Baseline: 67% function  Goal status: in progress   4.  Patient to be able to ambulate at least 350 feet with no ADs and no pain for improved functional community ambulation.  Baseline: 470 feet with SPC  Goal status: in progress     PLAN: PT FREQUENCY: 3x/week   PT DURATION: 6 weeks   PLANNED INTERVENTIONS: Therapeutic exercises, Therapeutic activity, Neuromuscular re-education, Balance training, Gait training, Patient/Family education, Self Care, Joint mobilization, Stair training, DME instructions, Cryotherapy, scar mobilization, Taping, Manual therapy, and Re-evaluation   PLAN FOR NEXT SESSION: Build functional strengthening while improving L hip ROM. Continue work to normalize gait. Gait trainer on TM    1:55 PM, 06/09/22 Josue Hector PT DPT  Physical Therapist with [facility]  [phone]

## 2022-06-10 ENCOUNTER — Encounter (HOSPITAL_COMMUNITY): Payer: Self-pay | Admitting: Physical Therapy

## 2022-06-10 ENCOUNTER — Ambulatory Visit (HOSPITAL_COMMUNITY): Payer: Medicare HMO | Admitting: Physical Therapy

## 2022-06-10 DIAGNOSIS — Z96642 Presence of left artificial hip joint: Secondary | ICD-10-CM | POA: Diagnosis not present

## 2022-06-10 DIAGNOSIS — M25652 Stiffness of left hip, not elsewhere classified: Secondary | ICD-10-CM

## 2022-06-10 DIAGNOSIS — M25552 Pain in left hip: Secondary | ICD-10-CM | POA: Diagnosis not present

## 2022-06-10 DIAGNOSIS — R262 Difficulty in walking, not elsewhere classified: Secondary | ICD-10-CM

## 2022-06-10 NOTE — Therapy (Signed)
OUTPATIENT PHYSICAL THERAPY TREATMENT NOTE   Patient Name: [name] MRN: [id number] DOB:[birth year], 66 y.o., male Today's Date: 06/10/2022  PCP: Sharilyn Sites, MD REFERRING PROVIDER: Carole Civil, MD    See note below for Objective Data and Assessment of Progress/Goals.     END OF SESSION:   PT End of Session - 06/10/22 1555     Visit Number 12    Number of Visits 19   eval + 3 x 6   Date for PT Re-Evaluation 06/29/22   3x6 weeks, extra week given for mid-week eval and make up time   Authorization Type Human [employer]    Authorization Time Period submitted for 8 more visits on 9/14 check auth    Authorization - Visit Number 12    Authorization - Number of Visits 12    Progress Note Due on Visit 19    PT Start Time 1600    PT Stop Time 1638    PT Time Calculation (min) 38 min    Equipment Utilized During Treatment Other (comment)   patient's SPC   Activity Tolerance Patient tolerated treatment well    Behavior During Therapy WFL for tasks assessed/performed               Past Medical History:  Diagnosis Date   CAD, multiple vessel 10/23/2014   Chronic back pain    Chronic hip pain    Diabetes mellitus    DM2 (diabetes mellitus, type 2) ([place]) 10/23/2014   GERD (gastroesophageal reflux disease)    Heart attack ([place])    Hyperlipidemia    Hyperlipidemia LDL goal <70 10/23/2014   Hypertension    Tobacco use, stopped 6 months ago  10/23/2014   Past Surgical History:  Procedure Laterality Date   LEFT HEART CATHETERIZATION WITH CORONARY ANGIOGRAM N/A 10/22/2014   Procedure: LEFT HEART CATHETERIZATION WITH CORONARY ANGIOGRAM;  Surgeon: Jettie Booze, MD;  Location: [facility] CATH LAB;  Service: Cardiovascular;  Laterality: N/A;   PERCUTANEOUS CORONARY STENT INTERVENTION (PCI-S)  10/22/2014   Procedure: PERCUTANEOUS CORONARY STENT INTERVENTION (PCI-S);  Surgeon: Jettie Booze, MD;  Location: [facility] CATH LAB;  Service: Cardiovascular;;  x3 to prox RCA ,  Prox CFX and dist CFX   TOTAL HIP ARTHROPLASTY Right    TOTAL HIP ARTHROPLASTY Left 04/27/2022   Procedure: TOTAL HIP ARTHROPLASTY;  Surgeon: Carole Civil, MD;  Location: [facility] ORS;  Service: Orthopedics;  Laterality: Left;   Patient Active Problem List   Diagnosis Date Noted   Osteoarthritis of left hip 04/27/2022   Unilateral primary osteoarthritis, left hip    CAD, multiple vessel, s/p DES to [place] and dLCX, and pRCA 10/22/14 10/23/2014   DM2 (diabetes mellitus, type 2) ([place]) 10/23/2014   Tobacco use, stopped 6 months ago  10/23/2014   GERD (gastroesophageal reflux disease) 10/23/2014   Hyperlipidemia LDL goal <70 10/23/2014   NSTEMI (non-ST elevated myocardial infarction) ([place]) 10/22/2014    REFERRING DIAG: V03.50 (ICD-10-CM) - Primary osteoarthritis of left hip  evaluate and treat left hip 3x weekly for 6 weeks needs to begin therapy around 05/10/22 date of surgery 04/27/22  lateral approach  THERAPY DIAG:  Stiffness of left hip, not elsewhere classified  Difficulty in walking, not elsewhere classified  S/P hip replacement, left  Pain in left hip  Rationale for Evaluation and Treatment Rehabilitation  PERTINENT HISTORY: Arthritis B knees, chronic knee pain, chronic hip pain, right hip replaced over 10 years ago  PRECAUTIONS: Posterior hip - patient discusses understanding  SUBJECTIVE: Pulling in frint of thigh. Was walking around [place] and had a shot of pain. Felt like he couldn't walk for a few minutes but can walk now. Still sore and tight.   PAIN:  Are you having pain? Yes: NPRS scale: 3/10 Pain location: LT groin  Pain description: achy Aggravating factors: colder temps, positioning, sleep Relieving factors: aspirin, gaba, one oxy as need , ice after movement as needed    OBJECTIVE:    DIAGNOSTIC FINDINGS: DG of pelvis post op with quoted report below =  "FINDINGS: Status post left hip arthroplasty with expected overlying postoperative change. No  evidence of perihardware fracture or component malpositioning. Pre-existing right hip arthroplasty."     PATIENT SURVEYS:  FOTO 58   COGNITION:           Overall cognitive status: Within functional limits for tasks assessed                          SENSATION: WFL   EDEMA:  Bandage present, patient reports edema, and some fluid/bleeding in region; to be assessed tomorrow at MD   MUSCLE LENGTH:     POSTURE: rounded shoulders, forward head, and weight shift right   PALPATION: NA secondary to surgical dressing in place   LOWER EXTREMITY ROM:   Active ROM Right eval Left eval Left 06/03/22  Hip flexion  105 57  85  Hip extension  0 -4  2  Hip abduction  25 18    Hip adduction  10  0   Hip internal rotation       Hip external rotation       Knee flexion       Knee extension       Ankle dorsiflexion       Ankle plantarflexion       Ankle inversion       Ankle eversion        (Blank rows = not tested)   LOWER EXTREMITY MMT:   MMT Right eval Left eval RT 06/03/22 LT 06/03/22  Hip flexion   5 5  Hip extension   5 4+  Hip abduction   4+ 4-  Hip adduction      Hip internal rotation        Hip external rotation        Knee flexion        Knee extension     5 5  Ankle dorsiflexion     5 5  Ankle plantarflexion        Ankle inversion        Ankle eversion         (Blank rows = not tested)   LOWER EXTREMITY SPECIAL TESTS:  Hip special tests: NT secondary to post op    FUNCTIONAL TESTS:  5 times sit to stand: 9.64 sec with no UE, but favors RLE  (was 12.58 sec from slightly elevated plinth with B UE onto knees, no Ads) 2 minute walk test: 470 feet with SPC (was 220 feet with SPC)   GAIT: Distance walked: 470 feet Assistive device utilized: Single point cane Level of assistance: SBA Comments: Equal stride length, slow cadence, minimal weight shift to left stance with cane with mod R UE pressure       TODAY'S TREATMENT: 06/10/22  TM 6 min up to 1.5 mph Retro  walking on TM 4 min up to 1.2 mph Stairs 7 inch 5 RT reciprocal single  hand rail  SLS 3 x 10" Clinic amb 1 RT no AD   Seated LAQ x20  Standing hip abduction 2 x 10 Standing hip extension 2 x x10  06/09/22 TM 5 min up to 1.5 mph Retro walking on TM 4 min up to 1.2 mph Stairs 7 inch 5 RT reciprocal single hand rail  Band sidestepping RTB 6 RT  RTB monster walks 6 RT FEWD/ RETRO  SLS 3 x 10" Clinic amb 2 RT no AD    06/07/22 Nustep seat 11 level 3 x 5 min for strength and ROM  6" box step ups with 1 UE assist 2 x 10 6" lateral box steps bil UE assist 2 x 10 6" step down 2 x 10 HHA x 1 6 inch step taps no HHA  x20  Mini squats 2 x 10  Hip flexion/ abduction/ extension with 3 lb weights 2 x 10 each  Clinic amb 1 RT no AD Retro walk on TM 78mph 3 min for sequencing    Body craft weighted walk outs 4 plates x 5 FWD/ Side/ Side    06/03/22 FOTO AROM MMT 5 x STS 2 MWT   Weighted walkouts 4 plates x10 (retro/ side/ side) Partial lunge 2 x 10 each (lead foot on 7 inch step)     PATIENT EDUCATION:  Education details: 05/11/22 - evaluation findings, POC, PT scope of practice, post operative healing time frames, ice recovery at home, cont elevation and pain management, HEP as below Person educated: Patient Education method: Explanation, Demonstration, and Handouts Education comprehension: verbalized understanding     HOME EXERCISE PROGRAM:  05/28/22 tandem stance Access Code: [email or web address] URL: [email or web address]  05/24/22 - Standing Hip Abduction with Resistance at Ankles and Counter Support  - 2 x daily - 7 x weekly - 3 sets - 10 reps - Standing Hip Extension with Resistance at Ankles and Counter Support  - 2 x daily - 7 x weekly - 3 sets - 10 reps  05/17/22 - Standing Knee Flexion with Unilateral Counter Support  - 2 x daily - 7 x weekly - 2 sets - 10 reps - Standing Single Leg Stance with Counter Support  - 2 x daily - 7 x weekly - 1 sets - 5 reps - 10  second hold Date: 05/11/2022 Prepared by: Jerilynn Som   Exercises - Standing Heel Raises  - 2 x daily - 7 x weekly - 3 sets - 10 reps - Standing Hip Abduction with Counter Support  - 2 x daily - 7 x weekly - 3 sets - 10 reps - Standing Hip Extension with Counter Support  - 2 x daily - 7 x weekly - 3 sets - 10 reps - Mini Squat with Counter Support  - 2 x daily - 7 x weekly - 3 sets - 10 reps   ASSESSMENT:   CLINICAL IMPRESSION: Patient reporting increased hip flexor cramping today. Continues to demo limp/ gait deviation. A little more pronounced today with hip flexor issues. Suspects he may be a little dehydrated causing increased muscle cramping. Decreased intensity of todays activity per patient subjective. He is showing improved single leg balance, able to stand up to 20 second in single limb stance on LLE today without pain. Patient will continue to benefit from skilled therapy services to reduce remaining deficits and improve functional ability.    OBJECTIVE IMPAIRMENTS Abnormal gait, decreased activity tolerance, decreased balance, decreased endurance, decreased mobility, difficulty walking, decreased ROM, decreased strength, increased edema, increased  fascial restrictions, impaired flexibility, improper body mechanics, and pain.    ACTIVITY LIMITATIONS carrying, lifting, bending, sitting, standing, squatting, sleeping, stairs, bathing, toileting, dressing, and locomotion level   PARTICIPATION LIMITATIONS: meal prep, cleaning, laundry, community activity, and yard work   PERSONAL FACTORS 1-2 comorbidities: DM, B knee arthritis  are also affecting patient's functional outcome.    REHAB POTENTIAL: Good   CLINICAL DECISION MAKING: Stable/uncomplicated   EVALUATION COMPLEXITY: Low     GOALS: Goals reviewed with patient? No   SHORT TERM GOALS: Target date: 06/01/2022  Patient will be independent with initial HEP and self-management strategies to improve functional  outcomes Baseline: 05/11/22 - initiated today Goal status: in progress   2.  Patient to be able to demonstrate improve L hip AROM to hip flexion max 90 deg for ability to sit into standard chair Baseline: Current: 2- 85 deg Goal status: in progress   3.  Patient to be able to demonstrate improved ambulation with no AD, equal WB, and continued equal stride length. Baseline: Able to walk with no AD, but with decreased stance on LLE  Goal status: in progress       LONG TERM GOALS: Target date: 06/29/2022    Patient will be independent with advanced HEP and self-management strategies to improve functional outcomes   Baseline: to be established Goal status: in progress   2.  Patient will be able to demonstrate improved function in transitions by preforming 5 times sit to stand in less than 11 seconds with no UE use and equal WB from a standard chair Baseline: 9.64 sec with no UE, but favors RLE  Goal status: Partially MET   3.  Patient to demonstrate improved function by increasing FOTO functional score to at least 70%.  Baseline: 67% function  Goal status: in progress   4.  Patient to be able to ambulate at least 350 feet with no ADs and no pain for improved functional community ambulation.  Baseline: 470 feet with SPC  Goal status: in progress     PLAN: PT FREQUENCY: 3x/week   PT DURATION: 6 weeks   PLANNED INTERVENTIONS: Therapeutic exercises, Therapeutic activity, Neuromuscular re-education, Balance training, Gait training, Patient/Family education, Self Care, Joint mobilization, Stair training, DME instructions, Cryotherapy, scar mobilization, Taping, Manual therapy, and Re-evaluation   PLAN FOR NEXT SESSION: Build functional strengthening while improving L hip ROM. Continue work to normalize gait.    4:39 PM, 06/10/22 Josue Hector PT DPT  Physical Therapist with [facility]  [phone]

## 2022-06-11 ENCOUNTER — Encounter (HOSPITAL_COMMUNITY): Payer: Medicare HMO

## 2022-06-14 DIAGNOSIS — G894 Chronic pain syndrome: Secondary | ICD-10-CM | POA: Diagnosis not present

## 2022-06-14 DIAGNOSIS — E6609 Other obesity due to excess calories: Secondary | ICD-10-CM | POA: Diagnosis not present

## 2022-06-14 DIAGNOSIS — Z6838 Body mass index (BMI) 38.0-38.9, adult: Secondary | ICD-10-CM | POA: Diagnosis not present

## 2022-06-14 DIAGNOSIS — Z23 Encounter for immunization: Secondary | ICD-10-CM | POA: Diagnosis not present

## 2022-06-16 ENCOUNTER — Ambulatory Visit (HOSPITAL_COMMUNITY): Payer: Medicare HMO

## 2022-06-16 ENCOUNTER — Encounter (HOSPITAL_COMMUNITY): Payer: Self-pay

## 2022-06-16 DIAGNOSIS — R262 Difficulty in walking, not elsewhere classified: Secondary | ICD-10-CM | POA: Diagnosis not present

## 2022-06-16 DIAGNOSIS — Z96642 Presence of left artificial hip joint: Secondary | ICD-10-CM | POA: Diagnosis not present

## 2022-06-16 DIAGNOSIS — M25652 Stiffness of left hip, not elsewhere classified: Secondary | ICD-10-CM

## 2022-06-16 DIAGNOSIS — M25552 Pain in left hip: Secondary | ICD-10-CM | POA: Diagnosis not present

## 2022-06-16 NOTE — Therapy (Signed)
OUTPATIENT PHYSICAL THERAPY TREATMENT NOTE   Patient Name: [name] MRN: [id number] DOB:Feb 01, 1956, 66 y.o., male Today's Date: 06/16/2022  PCP: Sharilyn Sites, MD REFERRING PROVIDER: Carole Civil, MD    END OF SESSION:   PT End of Session - 06/16/22 1037     Visit Number 14    Number of Visits 19    Date for PT Re-Evaluation 06/29/22   3x6 weeks, extra week given for mid-week eval and make up time   Authorization Type Human Medicare    Authorization Time Period 8 visits from 09/14-->06/29/22    Authorization - Visit Number 2    Authorization - Number of Visits 8    Progress Note Due on Visit 56    PT Start Time 0951    PT Stop Time 1031    PT Time Calculation (min) 40 min    Activity Tolerance Patient tolerated treatment well    Behavior During Therapy [facility] for tasks assessed/performed                Past Medical History:  Diagnosis Date   CAD, multiple vessel 10/23/2014   Chronic back pain    Chronic hip pain    Diabetes mellitus    DM2 (diabetes mellitus, type 2) ([place]) 10/23/2014   GERD (gastroesophageal reflux disease)    Heart attack ([place])    Hyperlipidemia    Hyperlipidemia LDL goal <70 10/23/2014   Hypertension    Tobacco use, stopped 6 months ago  10/23/2014   Past Surgical History:  Procedure Laterality Date   LEFT HEART CATHETERIZATION WITH CORONARY ANGIOGRAM N/A 10/22/2014   Procedure: LEFT HEART CATHETERIZATION WITH CORONARY ANGIOGRAM;  Surgeon: Jettie Booze, MD;  Location: [facility] CATH LAB;  Service: Cardiovascular;  Laterality: N/A;   PERCUTANEOUS CORONARY STENT INTERVENTION (PCI-S)  10/22/2014   Procedure: PERCUTANEOUS CORONARY STENT INTERVENTION (PCI-S);  Surgeon: Jettie Booze, MD;  Location: [facility] CATH LAB;  Service: Cardiovascular;;  x3 to prox RCA , Prox CFX and dist CFX   TOTAL HIP ARTHROPLASTY Right    TOTAL HIP ARTHROPLASTY Left 04/27/2022   Procedure: TOTAL HIP ARTHROPLASTY;  Surgeon: Carole Civil, MD;   Location: [facility] ORS;  Service: Orthopedics;  Laterality: Left;   Patient Active Problem List   Diagnosis Date Noted   Osteoarthritis of left hip 04/27/2022   Unilateral primary osteoarthritis, left hip    CAD, multiple vessel, s/p DES to [place] and dLCX, and pRCA 10/22/14 10/23/2014   DM2 (diabetes mellitus, type 2) ([place]) 10/23/2014   Tobacco use, stopped 6 months ago  10/23/2014   GERD (gastroesophageal reflux disease) 10/23/2014   Hyperlipidemia LDL goal <70 10/23/2014   NSTEMI (non-ST elevated myocardial infarction) ([place]) 10/22/2014    REFERRING DIAG: J50.09 (ICD-10-CM) - Primary osteoarthritis of left hip  evaluate and treat left hip 3x weekly for 6 weeks needs to begin therapy around 05/10/22 date of surgery 04/27/22  lateral approach  THERAPY DIAG:  Stiffness of left hip, not elsewhere classified  Difficulty in walking, not elsewhere classified  S/P hip replacement, left  Pain in left hip  Rationale for Evaluation and Treatment Rehabilitation  PERTINENT HISTORY: Arthritis B knees, chronic knee pain, chronic hip pain, right hip replaced over 10 years ago  PRECAUTIONS: Posterior hip - patient discusses understanding    SUBJECTIVE: Pt stated main difficulty walking without limp and some balalnce.  Arrived with [facility], has been walking some around house without.  Pain lateral aspect of Lt hip today.  Plans to  join 24/7 gym soon.  PAIN:  Are you having pain? Yes: NPRS scale: 1/10 Pain location: LT groin  Pain description: achy Aggravating factors: colder temps, positioning, sleep Relieving factors: aspirin, gaba, one oxy as need , ice after movement as needed    OBJECTIVE:    DIAGNOSTIC FINDINGS: DG of pelvis post op with quoted report below =  "FINDINGS: Status post left hip arthroplasty with expected overlying postoperative change. No evidence of perihardware fracture or component malpositioning. Pre-existing right hip arthroplasty."     PATIENT SURVEYS:  FOTO 58    COGNITION:           Overall cognitive status: Within functional limits for tasks assessed                          SENSATION: WFL   EDEMA:  Bandage present, patient reports edema, and some fluid/bleeding in region; to be assessed tomorrow at MD   MUSCLE LENGTH:     POSTURE: rounded shoulders, forward head, and weight shift right   PALPATION: NA secondary to surgical dressing in place   LOWER EXTREMITY ROM:   Active ROM Right eval Left eval Left 06/03/22  Hip flexion  105 57  85  Hip extension  0 -4  2  Hip abduction  25 18    Hip adduction  10  0   Hip internal rotation       Hip external rotation       Knee flexion       Knee extension       Ankle dorsiflexion       Ankle plantarflexion       Ankle inversion       Ankle eversion        (Blank rows = not tested)   LOWER EXTREMITY MMT:   MMT Right eval Left eval RT 06/03/22 LT 06/03/22  Hip flexion   5 5  Hip extension   5 4+  Hip abduction   4+ 4-  Hip adduction      Hip internal rotation        Hip external rotation        Knee flexion        Knee extension     5 5  Ankle dorsiflexion     5 5  Ankle plantarflexion        Ankle inversion        Ankle eversion         (Blank rows = not tested)   LOWER EXTREMITY SPECIAL TESTS:  Hip special tests: NT secondary to post op    FUNCTIONAL TESTS:  5 times sit to stand: 9.64 sec with no UE, but favors RLE  (was 12.58 sec from slightly elevated plinth with B UE onto knees, no Ads) 2 minute walk test: 470 feet with SPC (was 220 feet with SPC)   GAIT: Distance walked: 470 feet Assistive device utilized: Single point cane Level of assistance: SBA Comments: Equal stride length, slow cadence, minimal weight shift to left stance with cane with mod R UE pressure       TODAY'S TREATMENT: 06/16/22 TM 5 min up to 1.5 mph elevation 3% Rockerboard 12mn lateral  3D hip excursion (cueing for mechanics with squats for equal weight bearing) Stairs 7in step height  reciprocal 5RT with single hand rail Vector stance 3x 5" BLE with 1 HR Leg press 4Pl 2 sets 10 reps Bodycraft Retro and sidestep 4Pl  3RT each    06/10/22  TM 6 min up to 1.5 mph Retro walking on TM 4 min up to 1.2 mph Stairs 7 inch 5 RT reciprocal single hand rail  SLS 3 x 10" Clinic amb 1 RT no AD   Seated LAQ x20  Standing hip abduction 2 x 10 Standing hip extension 2 x x10  06/09/22 TM 5 min up to 1.5 mph Retro walking on TM 4 min up to 1.2 mph Stairs 7 inch 5 RT reciprocal single hand rail  Band sidestepping RTB 6 RT  RTB monster walks 6 RT FEWD/ RETRO  SLS 3 x 10" Clinic amb 2 RT no AD    06/07/22 Nustep seat 11 level 3 x 5 min for strength and ROM  6" box step ups with 1 UE assist 2 x 10 6" lateral box steps bil UE assist 2 x 10 6" step down 2 x 10 HHA x 1 6 inch step taps no HHA  x20  Mini squats 2 x 10  Hip flexion/ abduction/ extension with 3 lb weights 2 x 10 each  Clinic amb 1 RT no AD Retro walk on TM 15mh 3 min for sequencing    Body craft weighted walk outs 4 plates x 5 FWD/ Side/ Side    06/03/22 FOTO AROM MMT 5 x STS 2 MWT   Weighted walkouts 4 plates x10 (retro/ side/ side) Partial lunge 2 x 10 each (lead foot on 7 inch step)     PATIENT EDUCATION:  Education details: 05/11/22 - evaluation findings, POC, PT scope of practice, post operative healing time frames, ice recovery at home, cont elevation and pain management, HEP as below Person educated: Patient Education method: Explanation, Demonstration, and Handouts Education comprehension: verbalized understanding     HOME EXERCISE PROGRAM: 06/15/22:  3D hip excursion front of chair to improve squat mechanincs  05/28/22 tandem stance Access Code: [email or web address] URL: [email or web address]  05/24/22 - Standing Hip Abduction with Resistance at Ankles and Counter Support  - 2 x daily - 7 x weekly - 3 sets - 10 reps - Standing Hip Extension with Resistance at Ankles and Counter Support   - 2 x daily - 7 x weekly - 3 sets - 10 reps  05/17/22 - Standing Knee Flexion with Unilateral Counter Support  - 2 x daily - 7 x weekly - 2 sets - 10 reps - Standing Single Leg Stance with Counter Support  - 2 x daily - 7 x weekly - 1 sets - 5 reps - 10 second hold Date: 05/11/2022 Prepared by: PJerilynn Som  Exercises - Standing Heel Raises  - 2 x daily - 7 x weekly - 3 sets - 10 reps - Standing Hip Abduction with Counter Support  - 2 x daily - 7 x weekly - 3 sets - 10 reps - Standing Hip Extension with Counter Support  - 2 x daily - 7 x weekly - 3 sets - 10 reps - Mini Squat with Counter Support  - 2 x daily - 7 x weekly - 3 sets - 10 reps   ASSESSMENT:   CLINICAL IMPRESSION: Session focus with hip mobility and strengthening to improve stance phase during gait and balance training.  Added vector stance for hip strengthening and SLS and 3D hip excursion for gluteal strengthening and mobility.  Pt plans to join gym soon, encouraged to get instruction on proper use of machines and to keep weight at light to medium with slow controlled mechanics.  No reports of pain through session  OBJECTIVE IMPAIRMENTS Abnormal gait, decreased activity tolerance, decreased balance, decreased endurance, decreased mobility, difficulty walking, decreased ROM, decreased strength, increased edema, increased fascial restrictions, impaired flexibility, improper body mechanics, and pain.    ACTIVITY LIMITATIONS carrying, lifting, bending, sitting, standing, squatting, sleeping, stairs, bathing, toileting, dressing, and locomotion level   PARTICIPATION LIMITATIONS: meal prep, cleaning, laundry, community activity, and yard work   PERSONAL FACTORS 1-2 comorbidities: DM, B knee arthritis  are also affecting patient's functional outcome.    REHAB POTENTIAL: Good   CLINICAL DECISION MAKING: Stable/uncomplicated   EVALUATION COMPLEXITY: Low     GOALS: Goals reviewed with patient? No   SHORT TERM GOALS:  Target date: 06/01/2022  Patient will be independent with initial HEP and self-management strategies to improve functional outcomes Baseline: 05/11/22 - initiated today Goal status: in progress   2.  Patient to be able to demonstrate improve L hip AROM to hip flexion max 90 deg for ability to sit into standard chair Baseline: Current: 2- 85 deg Goal status: in progress   3.  Patient to be able to demonstrate improved ambulation with no AD, equal WB, and continued equal stride length. Baseline: Able to walk with no AD, but with decreased stance on LLE  Goal status: in progress       LONG TERM GOALS: Target date: 06/29/2022    Patient will be independent with advanced HEP and self-management strategies to improve functional outcomes   Baseline: to be established Goal status: in progress   2.  Patient will be able to demonstrate improved function in transitions by preforming 5 times sit to stand in less than 11 seconds with no UE use and equal WB from a standard chair Baseline: 9.64 sec with no UE, but favors RLE  Goal status: Partially MET   3.  Patient to demonstrate improved function by increasing FOTO functional score to at least 70%.  Baseline: 67% function  Goal status: in progress   4.  Patient to be able to ambulate at least 350 feet with no ADs and no pain for improved functional community ambulation.  Baseline: 470 feet with SPC  Goal status: in progress     PLAN: PT FREQUENCY: 3x/week   PT DURATION: 6 weeks   PLANNED INTERVENTIONS: Therapeutic exercises, Therapeutic activity, Neuromuscular re-education, Balance training, Gait training, Patient/Family education, Self Care, Joint mobilization, Stair training, DME instructions, Cryotherapy, scar mobilization, Taping, Manual therapy, and Re-evaluation   PLAN FOR NEXT SESSION: Build functional strengthening while improving L hip ROM. Continue work to normalize gait.   Ihor Austin, LPTA/CLT; CBIS [phone]  12:54  PM, 06/16/22

## 2022-06-23 ENCOUNTER — Ambulatory Visit (HOSPITAL_COMMUNITY): Payer: Medicare HMO | Admitting: Physical Therapy

## 2022-06-23 DIAGNOSIS — R262 Difficulty in walking, not elsewhere classified: Secondary | ICD-10-CM

## 2022-06-23 DIAGNOSIS — M25552 Pain in left hip: Secondary | ICD-10-CM | POA: Diagnosis not present

## 2022-06-23 DIAGNOSIS — M25652 Stiffness of left hip, not elsewhere classified: Secondary | ICD-10-CM | POA: Diagnosis not present

## 2022-06-23 DIAGNOSIS — Z96642 Presence of left artificial hip joint: Secondary | ICD-10-CM | POA: Diagnosis not present

## 2022-06-23 NOTE — Therapy (Signed)
OUTPATIENT PHYSICAL THERAPY TREATMENT NOTE   Patient Name: [name] MRN: [id number] DOB:1956/07/16, 66 y.o., male Today's Date: 06/23/2022  PCP: Sharilyn Sites, MD REFERRING PROVIDER: Carole Civil, MD    END OF SESSION:   PT End of Session - 06/23/22 0957     Visit Number 15    Number of Visits 19    Date for PT Re-Evaluation 06/29/22   3x6 weeks, extra week given for mid-week eval and make up time   Authorization Type Human Medicare    Authorization Time Period 8 visits from 09/14-->06/29/22    Authorization - Visit Number 3    Authorization - Number of Visits 8    Progress Note Due on Visit 21    PT Start Time 0949    PT Stop Time 1030    PT Time Calculation (min) 41 min    Activity Tolerance Patient tolerated treatment well    Behavior During Therapy [facility] for tasks assessed/performed                Past Medical History:  Diagnosis Date   CAD, multiple vessel 10/23/2014   Chronic back pain    Chronic hip pain    Diabetes mellitus    DM2 (diabetes mellitus, type 2) ([place]) 10/23/2014   GERD (gastroesophageal reflux disease)    Heart attack ([place])    Hyperlipidemia    Hyperlipidemia LDL goal <70 10/23/2014   Hypertension    Tobacco use, stopped 6 months ago  10/23/2014   Past Surgical History:  Procedure Laterality Date   LEFT HEART CATHETERIZATION WITH CORONARY ANGIOGRAM N/A 10/22/2014   Procedure: LEFT HEART CATHETERIZATION WITH CORONARY ANGIOGRAM;  Surgeon: Jettie Booze, MD;  Location: [facility] CATH LAB;  Service: Cardiovascular;  Laterality: N/A;   PERCUTANEOUS CORONARY STENT INTERVENTION (PCI-S)  10/22/2014   Procedure: PERCUTANEOUS CORONARY STENT INTERVENTION (PCI-S);  Surgeon: Jettie Booze, MD;  Location: [facility] CATH LAB;  Service: Cardiovascular;;  x3 to prox RCA , Prox CFX and dist CFX   TOTAL HIP ARTHROPLASTY Right    TOTAL HIP ARTHROPLASTY Left 04/27/2022   Procedure: TOTAL HIP ARTHROPLASTY;  Surgeon: Carole Civil, MD;   Location: [facility] ORS;  Service: Orthopedics;  Laterality: Left;   Patient Active Problem List   Diagnosis Date Noted   Osteoarthritis of left hip 04/27/2022   Unilateral primary osteoarthritis, left hip    CAD, multiple vessel, s/p DES to [place] and dLCX, and pRCA 10/22/14 10/23/2014   DM2 (diabetes mellitus, type 2) ([place]) 10/23/2014   Tobacco use, stopped 6 months ago  10/23/2014   GERD (gastroesophageal reflux disease) 10/23/2014   Hyperlipidemia LDL goal <70 10/23/2014   NSTEMI (non-ST elevated myocardial infarction) ([place]) 10/22/2014    REFERRING DIAG: I50.38 (ICD-10-CM) - Primary osteoarthritis of left hip  evaluate and treat left hip 3x weekly for 6 weeks needs to begin therapy around 05/10/22 date of surgery 04/27/22  lateral approach  THERAPY DIAG:  Stiffness of left hip, not elsewhere classified  Difficulty in walking, not elsewhere classified  S/P hip replacement, left  Rationale for Evaluation and Treatment Rehabilitation  PERTINENT HISTORY: Arthritis B knees, chronic knee pain, chronic hip pain, right hip replaced over 10 years ago  PRECAUTIONS: Posterior hip - patient discusses understanding    SUBJECTIVE: Pt states he got a gym membership at "[employer] in [place].  States he went yesterday and is doing the treadmill, leg press and upper body workout. Pt still voicing concerns about his limp and  some balalnce.  Arrived with [facility], has been walking some around house without.  Pain lateral aspect of Lt hip today.  Plans to join [employer] gym soon.  PAIN:  Are you having pain? Yes: NPRS scale: 1/10 Pain location: LT groin  Pain description: achy Aggravating factors: colder temps, positioning, sleep Relieving factors: aspirin, gaba, one oxy as need , ice after movement as needed    OBJECTIVE:    DIAGNOSTIC FINDINGS: DG of pelvis post op with quoted report below =  "FINDINGS: Status post left hip arthroplasty with expected overlying postoperative change. No evidence  of perihardware fracture or component malpositioning. Pre-existing right hip arthroplasty."     PATIENT SURVEYS:  FOTO 58   COGNITION:           Overall cognitive status: Within functional limits for tasks assessed                          SENSATION: WFL   EDEMA:  Bandage present, patient reports edema, and some fluid/bleeding in region; to be assessed tomorrow at MD   MUSCLE LENGTH:     POSTURE: rounded shoulders, forward head, and weight shift right   PALPATION: NA secondary to surgical dressing in place   LOWER EXTREMITY ROM:   Active ROM Right eval Left eval Left 06/03/22  Hip flexion  105 57  85  Hip extension  0 -4  2  Hip abduction  25 18    Hip adduction  10  0   Hip internal rotation       Hip external rotation       Knee flexion       Knee extension       Ankle dorsiflexion       Ankle plantarflexion       Ankle inversion       Ankle eversion        (Blank rows = not tested)   LOWER EXTREMITY MMT:   MMT Right eval Left eval RT 06/03/22 LT 06/03/22  Hip flexion   5 5  Hip extension   5 4+  Hip abduction   4+ 4-  Hip adduction      Hip internal rotation        Hip external rotation        Knee flexion        Knee extension     5 5  Ankle dorsiflexion     5 5  Ankle plantarflexion        Ankle inversion        Ankle eversion         (Blank rows = not tested)   LOWER EXTREMITY SPECIAL TESTS:  Hip special tests: NT secondary to post op    FUNCTIONAL TESTS:  5 times sit to stand: 9.64 sec with no UE, but favors RLE  (was 12.58 sec from slightly elevated plinth with B UE onto knees, no Ads) 2 minute walk test: 470 feet with SPC (was 220 feet with SPC)   GAIT: Distance walked: 470 feet Assistive device utilized: Single point cane Level of assistance: SBA Comments: Equal stride length, slow cadence, minimal weight shift to left stance with cane with mod R UE pressure       TODAY'S TREATMENT:  06/23/22  TM 5 minutes 1.59mh 4% elevation  3D  hip excursions 10X each with cues for squat and increasing hip movements  Vector stance 5X5" bil LE with 1 HR  and upright posturing  Hip hiking with Lt LE using 6" step with bil UE assist 2X10  Hip abduction 2X10 each with UE assist Bodycraft Retro and sidestep 4Pl 5RT each Leg Press 4PL 2X10  06/16/22 TM 5 min up to 1.5 mph elevation 3% Rockerboard 55mn lateral  3D hip excursion (cueing for mechanics with squats for equal weight bearing) Stairs 7in step height reciprocal 5RT with single hand rail Vector stance 3x 5" BLE with 1 HR Leg press 4Pl 2 sets 10 reps Bodycraft Retro and sidestep 4Pl 3RT each  06/10/22 TM 6 min up to 1.5 mph Retro walking on TM 4 min up to 1.2 mph Stairs 7 inch 5 RT reciprocal single hand rail  SLS 3 x 10" Clinic amb 1 RT no AD  Seated LAQ x20 Standing hip abduction 2 x 10 Standing hip extension 2 x x10  06/09/22 TM 5 min up to 1.5 mph Retro walking on TM 4 min up to 1.2 mph Stairs 7 inch 5 RT reciprocal single hand rail  Band sidestepping RTB 6 RT  RTB monster walks 6 RT FEWD/ RETRO  SLS 3 x 10" Clinic amb 2 RT no AD    06/07/22 Nustep seat 11 level 3 x 5 min for strength and ROM  6" box step ups with 1 UE assist 2 x 10 6" lateral box steps bil UE assist 2 x 10 6" step down 2 x 10 HHA x 1 6 inch step taps no HHA  x20  Mini squats 2 x 10  Hip flexion/ abduction/ extension with 3 lb weights 2 x 10 each  Clinic amb 1 RT no AD Retro walk on TM 154m 3 min for sequencing    Body craft weighted walk outs 4 plates x 5 FWD/ Side/ Side    06/03/22 FOTO AROM MMT 5 x STS 2 MWT   Weighted walkouts 4 plates x10 (retro/ side/ side) Partial lunge 2 x 10 each (lead foot on 7 inch step)     PATIENT EDUCATION:  Education details: 05/11/22 - evaluation findings, POC, PT scope of practice, post operative healing time frames, ice recovery at home, cont elevation and pain management, HEP as below Person educated: Patient Education method: Explanation,  Demonstration, and Handouts Education comprehension: verbalized understanding     HOME EXERCISE PROGRAM: 06/15/22:  3D hip excursion front of chair to improve squat mechanincs  05/28/22 tandem stance Access Code: [email or web address] URL: [email or web address]  05/24/22 - Standing Hip Abduction with Resistance at Ankles and Counter Support  - 2 x daily - 7 x weekly - 3 sets - 10 reps - Standing Hip Extension with Resistance at Ankles and Counter Support  - 2 x daily - 7 x weekly - 3 sets - 10 reps  05/17/22 - Standing Knee Flexion with Unilateral Counter Support  - 2 x daily - 7 x weekly - 2 sets - 10 reps - Standing Single Leg Stance with Counter Support  - 2 x daily - 7 x weekly - 1 sets - 5 reps - 10 second hold Date: 05/11/2022 Prepared by: PaJerilynn Som Exercises - Standing Heel Raises  - 2 x daily - 7 x weekly - 3 sets - 10 reps - Standing Hip Abduction with Counter Support  - 2 x daily - 7 x weekly - 3 sets - 10 reps - Standing Hip Extension with Counter Support  - 2 x daily - 7 x weekly - 3 sets - 10 reps - Mini Squat  with Counter Support  - 2 x daily - 7 x weekly - 3 sets - 10 reps   ASSESSMENT:   CLINICAL IMPRESSION: Continued focus on improving Lt hip strength and improving gait quality.  Continued with vector stance with noted challenge when standing on Lt LE.  Cues for 3D hip mobility to produce at hips and not upper body. Added hip hike, also with challenge to target weak gluteal mm. Pt with questions/concerns on his gait mechanics.  Explained to patient how his weak hip and glute mm are contributing to this .  Worked on improving gait with slower speed and recruitment of glute to stabilize upper trunk. No reports of pain through session.  Is participating at gym outside of therapy.   Discussed re-eval next session with possible discharge.  OBJECTIVE IMPAIRMENTS Abnormal gait, decreased activity tolerance, decreased balance, decreased endurance, decreased mobility,  difficulty walking, decreased ROM, decreased strength, increased edema, increased fascial restrictions, impaired flexibility, improper body mechanics, and pain.    ACTIVITY LIMITATIONS carrying, lifting, bending, sitting, standing, squatting, sleeping, stairs, bathing, toileting, dressing, and locomotion level   PARTICIPATION LIMITATIONS: meal prep, cleaning, laundry, community activity, and yard work   PERSONAL FACTORS 1-2 comorbidities: DM, B knee arthritis  are also affecting patient's functional outcome.    REHAB POTENTIAL: Good   CLINICAL DECISION MAKING: Stable/uncomplicated   EVALUATION COMPLEXITY: Low     GOALS: Goals reviewed with patient? No   SHORT TERM GOALS: Target date: 06/01/2022  Patient will be independent with initial HEP and self-management strategies to improve functional outcomes Baseline: 05/11/22 - initiated today Goal status: in progress   2.  Patient to be able to demonstrate improve L hip AROM to hip flexion max 90 deg for ability to sit into standard chair Baseline: Current: 2- 85 deg Goal status: in progress   3.  Patient to be able to demonstrate improved ambulation with no AD, equal WB, and continued equal stride length. Baseline: Able to walk with no AD, but with decreased stance on LLE  Goal status: in progress       LONG TERM GOALS: Target date: 06/29/2022    Patient will be independent with advanced HEP and self-management strategies to improve functional outcomes   Baseline: to be established Goal status: in progress   2.  Patient will be able to demonstrate improved function in transitions by preforming 5 times sit to stand in less than 11 seconds with no UE use and equal WB from a standard chair Baseline: 9.64 sec with no UE, but favors RLE  Goal status: Partially MET   3.  Patient to demonstrate improved function by increasing FOTO functional score to at least 70%.  Baseline: 67% function  Goal status: in progress   4.  Patient to be  able to ambulate at least 350 feet with no ADs and no pain for improved functional community ambulation.  Baseline: 470 feet with SPC  Goal status: in progress     PLAN: PT FREQUENCY: 3x/week   PT DURATION: 6 weeks   PLANNED INTERVENTIONS: Therapeutic exercises, Therapeutic activity, Neuromuscular re-education, Balance training, Gait training, Patient/Family education, Self Care, Joint mobilization, Stair training, DME instructions, Cryotherapy, scar mobilization, Taping, Manual therapy, and Re-evaluation   PLAN FOR NEXT SESSION:  Re-evaluate next session.  Teena Irani, PTA/CLT [place] Ph: [phone]  3:28 PM, 06/23/22

## 2022-06-29 DIAGNOSIS — E6609 Other obesity due to excess calories: Secondary | ICD-10-CM | POA: Diagnosis not present

## 2022-06-29 DIAGNOSIS — E1165 Type 2 diabetes mellitus with hyperglycemia: Secondary | ICD-10-CM | POA: Diagnosis not present

## 2022-06-29 DIAGNOSIS — Z1331 Encounter for screening for depression: Secondary | ICD-10-CM | POA: Diagnosis not present

## 2022-06-29 DIAGNOSIS — Z6838 Body mass index (BMI) 38.0-38.9, adult: Secondary | ICD-10-CM | POA: Diagnosis not present

## 2022-06-29 DIAGNOSIS — E1129 Type 2 diabetes mellitus with other diabetic kidney complication: Secondary | ICD-10-CM | POA: Diagnosis not present

## 2022-06-29 DIAGNOSIS — M1712 Unilateral primary osteoarthritis, left knee: Secondary | ICD-10-CM | POA: Diagnosis not present

## 2022-06-29 DIAGNOSIS — Z125 Encounter for screening for malignant neoplasm of prostate: Secondary | ICD-10-CM | POA: Diagnosis not present

## 2022-06-29 DIAGNOSIS — N138 Other obstructive and reflux uropathy: Secondary | ICD-10-CM | POA: Diagnosis not present

## 2022-06-29 DIAGNOSIS — Z0001 Encounter for general adult medical examination with abnormal findings: Secondary | ICD-10-CM | POA: Diagnosis not present

## 2022-06-29 DIAGNOSIS — I10 Essential (primary) hypertension: Secondary | ICD-10-CM | POA: Diagnosis not present

## 2022-06-29 DIAGNOSIS — I2510 Atherosclerotic heart disease of native coronary artery without angina pectoris: Secondary | ICD-10-CM | POA: Diagnosis not present

## 2022-07-15 DIAGNOSIS — I10 Essential (primary) hypertension: Secondary | ICD-10-CM | POA: Diagnosis not present

## 2022-07-15 DIAGNOSIS — M1712 Unilateral primary osteoarthritis, left knee: Secondary | ICD-10-CM | POA: Diagnosis not present

## 2022-07-15 DIAGNOSIS — G894 Chronic pain syndrome: Secondary | ICD-10-CM | POA: Diagnosis not present

## 2022-07-26 ENCOUNTER — Ambulatory Visit (INDEPENDENT_AMBULATORY_CARE_PROVIDER_SITE_OTHER): Payer: Medicare HMO | Admitting: Orthopedic Surgery

## 2022-07-26 DIAGNOSIS — Z96642 Presence of left artificial hip joint: Secondary | ICD-10-CM

## 2022-07-26 NOTE — Progress Notes (Signed)
FOLLOW UP   Encounter Diagnosis  Name Primary?   Status post left hip replacement (04/27/22) Yes     Chief Complaint  Patient presents with   Post-op Follow-up    Left THA DOS 04/27/22 -doing great    [name] is doing well not having any pain he is able to walk independently he may have just a little longer left leg than right but is not bothering him right now.  If necessary we can always use a quarter to half inch heel lift inside the shoe  X-ray at 1 year postop

## 2022-08-17 DIAGNOSIS — I10 Essential (primary) hypertension: Secondary | ICD-10-CM | POA: Diagnosis not present

## 2022-08-17 DIAGNOSIS — M1712 Unilateral primary osteoarthritis, left knee: Secondary | ICD-10-CM | POA: Diagnosis not present

## 2022-08-17 DIAGNOSIS — E1129 Type 2 diabetes mellitus with other diabetic kidney complication: Secondary | ICD-10-CM | POA: Diagnosis not present

## 2022-08-17 DIAGNOSIS — G894 Chronic pain syndrome: Secondary | ICD-10-CM | POA: Diagnosis not present

## 2022-08-18 ENCOUNTER — Other Ambulatory Visit: Payer: Self-pay | Admitting: Urology

## 2022-09-14 DIAGNOSIS — I10 Essential (primary) hypertension: Secondary | ICD-10-CM | POA: Diagnosis not present

## 2022-09-14 DIAGNOSIS — M1712 Unilateral primary osteoarthritis, left knee: Secondary | ICD-10-CM | POA: Diagnosis not present

## 2022-09-14 DIAGNOSIS — T50905A Adverse effect of unspecified drugs, medicaments and biological substances, initial encounter: Secondary | ICD-10-CM | POA: Diagnosis not present

## 2022-09-14 DIAGNOSIS — G894 Chronic pain syndrome: Secondary | ICD-10-CM | POA: Diagnosis not present

## 2022-09-14 DIAGNOSIS — N401 Enlarged prostate with lower urinary tract symptoms: Secondary | ICD-10-CM | POA: Diagnosis not present

## 2022-09-14 DIAGNOSIS — E6609 Other obesity due to excess calories: Secondary | ICD-10-CM | POA: Diagnosis not present

## 2022-09-14 DIAGNOSIS — Z6839 Body mass index (BMI) 39.0-39.9, adult: Secondary | ICD-10-CM | POA: Diagnosis not present

## 2022-09-14 DIAGNOSIS — E1129 Type 2 diabetes mellitus with other diabetic kidney complication: Secondary | ICD-10-CM | POA: Diagnosis not present

## 2022-09-28 ENCOUNTER — Telehealth: Payer: Self-pay

## 2022-09-28 NOTE — Telephone Encounter (Signed)
Pt requesting refill on: tadalafil (CIALIS) 5 MG tablet   Pharmacy:   [facility] [address], [place] - [phone] [place] [address] [place] Phone: [phone]  Fax: [phone]     Patient wanting to know how many refills he can receive. He had hip surgery, this is why he missed his last appointment. Would like a call back to let him know.  Thanks, Helene Kelp

## 2022-09-29 NOTE — Telephone Encounter (Signed)
Pt came by office to check on status of refill.  Please call pt back to let him know if the medication was filled at [phone] (H)   Thanks, Helene Kelp

## 2022-09-29 NOTE — Telephone Encounter (Signed)
Made patient aware that I sent a task to Dr. Jeffie Pollock and once he response someone will be back in touch with him. Patient voiced understanding.

## 2022-09-30 ENCOUNTER — Telehealth: Payer: Self-pay

## 2022-09-30 ENCOUNTER — Other Ambulatory Visit: Payer: Self-pay

## 2022-09-30 DIAGNOSIS — N5201 Erectile dysfunction due to arterial insufficiency: Secondary | ICD-10-CM

## 2022-09-30 MED ORDER — TADALAFIL 5 MG PO TABS: 5.0000 mg | ORAL_TABLET | Freq: Every day | ORAL | 0 refills | Status: DC | PRN

## 2022-09-30 NOTE — Telephone Encounter (Signed)
Made patient aware that his Cialis is PRN and a refill has been sent to his pharmacy. Patient voiced understanding.

## 2022-09-30 NOTE — Telephone Encounter (Signed)
Pharmacy changed

## 2022-09-30 NOTE — Telephone Encounter (Signed)
Tried calling patient with no answer. Will try back later. 

## 2022-09-30 NOTE — Telephone Encounter (Signed)
Patient called advising he needed medication below sent to [facility] in [place] instead of [place].   Medication: tadalafil (CIALIS) 5 MG tablet    Thank you

## 2022-10-14 DIAGNOSIS — I10 Essential (primary) hypertension: Secondary | ICD-10-CM | POA: Diagnosis not present

## 2022-10-14 DIAGNOSIS — Z6839 Body mass index (BMI) 39.0-39.9, adult: Secondary | ICD-10-CM | POA: Diagnosis not present

## 2022-10-14 DIAGNOSIS — T50905A Adverse effect of unspecified drugs, medicaments and biological substances, initial encounter: Secondary | ICD-10-CM | POA: Diagnosis not present

## 2022-10-14 DIAGNOSIS — E1129 Type 2 diabetes mellitus with other diabetic kidney complication: Secondary | ICD-10-CM | POA: Diagnosis not present

## 2022-10-14 DIAGNOSIS — N401 Enlarged prostate with lower urinary tract symptoms: Secondary | ICD-10-CM | POA: Diagnosis not present

## 2022-10-14 DIAGNOSIS — E6609 Other obesity due to excess calories: Secondary | ICD-10-CM | POA: Diagnosis not present

## 2022-10-14 DIAGNOSIS — G894 Chronic pain syndrome: Secondary | ICD-10-CM | POA: Diagnosis not present

## 2022-10-14 DIAGNOSIS — E1165 Type 2 diabetes mellitus with hyperglycemia: Secondary | ICD-10-CM | POA: Diagnosis not present

## 2022-10-21 ENCOUNTER — Ambulatory Visit (INDEPENDENT_AMBULATORY_CARE_PROVIDER_SITE_OTHER): Payer: Medicare HMO | Admitting: Urology

## 2022-10-21 ENCOUNTER — Ambulatory Visit: Payer: Medicare HMO | Admitting: Urology

## 2022-10-21 VITALS — BP 148/76 | HR 76

## 2022-10-21 DIAGNOSIS — R351 Nocturia: Secondary | ICD-10-CM | POA: Diagnosis not present

## 2022-10-21 DIAGNOSIS — N3941 Urge incontinence: Secondary | ICD-10-CM | POA: Diagnosis not present

## 2022-10-21 DIAGNOSIS — N401 Enlarged prostate with lower urinary tract symptoms: Secondary | ICD-10-CM | POA: Diagnosis not present

## 2022-10-21 DIAGNOSIS — N3943 Post-void dribbling: Secondary | ICD-10-CM | POA: Diagnosis not present

## 2022-10-21 DIAGNOSIS — N5201 Erectile dysfunction due to arterial insufficiency: Secondary | ICD-10-CM

## 2022-10-21 MED ORDER — TADALAFIL 5 MG PO TABS: 5.0000 mg | ORAL_TABLET | Freq: Every day | ORAL | 12 refills | Status: DC | PRN

## 2022-10-21 NOTE — Progress Notes (Signed)
Subjective: 1. Erectile dysfunction due to arterial insufficiency   2. Benign prostatic hyperplasia (BPH) with post-void dribbling   3. Nocturia   4. Urge incontinence       Consult requested by Dr. Sharilyn Sites.    Gerritt is a 67 yo male who is sent for incontinence.  He has primarily post void dribbling.  He has no stress incontinence.  He can have some minor UUI.  He has frequency q35-45 min which has started recently.  He has nocturia 2-3x.  He has no enuresis.   He has a good stream.  He feels like it empties. He was given tamsulosin but it seemed worse off of the med.  He has a history of ED and has used sildenafil.   He is circumcised.   He has no history of stones, UTI's or GU surgery.    His UA is clear and his PVR is 69ml.   11/10/22[name] returns today in f/u for a flow rate and cystoscopy to better assess his voiding symptoms.  He was concerned about a urine smell reported by his partner that he has discovered was from failure to retract the foreskin.  He does that now and doesn't have the odor.  He still has some frequency q2hr but has some UUI or rare occasion.  He has nocturia x 2-3.  He has ED that is responding to sildenafil.  2/9/23[name] returns today in f/u.  He was changed to daily tadalafil at his last visit for his BPH and ED.  He is not taking it daily but it works the erections.   His iPSS today is 11 with nocturia x 3,  a reduced stream and a sensation of incomplete empyting.  He is reasonably content with his urinary symptoms.    1/25/24[name] returns today in f/u.  He has been on tadalafil daily for ED and BPH.  He needs a refill.  His IPSS is 5 with nocturia x 2.   Dr. Hilma Favors has been checking PSA's.     IPSS     Row Name 10/21/22 1400         International Prostate Symptom Score   How often have you had the sensation of not emptying your bladder? Not at All     How often have you had to urinate less than every two hours? About half the time      How often have you found you stopped and started again several times when you urinated? Not at All     How often have you found it difficult to postpone urination? Not at All     How often have you had a weak urinary stream? Not at All     How often have you had to strain to start urination? Not at All     How many times did you typically get up at night to urinate? 2 Times     Total IPSS Score 5       Quality of Life due to urinary symptoms   If you were to spend the rest of your life with your urinary condition just the way it is now how would you feel about that? Mostly Satisfied              ROS:  Review of Systems  All other systems reviewed and are negative.   No Known Allergies  Past Medical History:  Diagnosis Date   CAD, multiple vessel 10/23/2014   Chronic back pain  Chronic hip pain    Diabetes mellitus    DM2 (diabetes mellitus, type 2) ([place]) 10/23/2014   GERD (gastroesophageal reflux disease)    Heart attack ([place])    Hyperlipidemia    Hyperlipidemia LDL goal <70 10/23/2014   Hypertension    Tobacco use, stopped 6 months ago  10/23/2014    Past Surgical History:  Procedure Laterality Date   LEFT HEART CATHETERIZATION WITH CORONARY ANGIOGRAM N/A 10/22/2014   Procedure: LEFT HEART CATHETERIZATION WITH CORONARY ANGIOGRAM;  Surgeon: Jettie Booze, MD;  Location: [facility] CATH LAB;  Service: Cardiovascular;  Laterality: N/A;   PERCUTANEOUS CORONARY STENT INTERVENTION (PCI-S)  10/22/2014   Procedure: PERCUTANEOUS CORONARY STENT INTERVENTION (PCI-S);  Surgeon: Jettie Booze, MD;  Location: [facility] CATH LAB;  Service: Cardiovascular;;  x3 to prox RCA , Prox CFX and dist CFX   TOTAL HIP ARTHROPLASTY Right    TOTAL HIP ARTHROPLASTY Left 04/27/2022   Procedure: TOTAL HIP ARTHROPLASTY;  Surgeon: Carole Civil, MD;  Location: [facility] ORS;  Service: Orthopedics;  Laterality: Left;    Social History   Socioeconomic History   Marital status: Married    Spouse name:  Not on file   Number of children: 6   Years of education: Not on file   Highest education level: Not on file  Occupational History   Occupation: disabled  Tobacco Use   Smoking status: Former    Types: Cigarettes    Start date: 04/21/2014   Smokeless tobacco: Never  Substance and Sexual Activity   Alcohol use: Yes    Alcohol/week: 0.0 standard drinks of alcohol    Comment: drinks beer on weekends   Drug use: No   Sexual activity: Not on file  Other Topics Concern   Not on file  Social History Narrative   Not on file   Social Determinants of Health   Financial Resource Strain: Not on file  Food Insecurity: Not on file  Transportation Needs: Not on file  Physical Activity: Not on file  Stress: Not on file  Social Connections: Not on file  Intimate Partner Violence: Not on file    Family History  Problem Relation Age of Onset   Alzheimer's disease Mother    Heart disease Father    Heart disease Sister     Anti-infectives: Anti-infectives (From admission, onward)    None       Current Outpatient Medications  Medication Sig Dispense Refill   acetaminophen (TYLENOL) 500 MG tablet Take 1 tablet (500 mg total) by mouth every 6 (six) hours as needed. 90 tablet 0   HYDROcodone-acetaminophen (NORCO/VICODIN) 5-325 MG tablet Take 1 tablet by mouth at bedtime as needed for moderate pain. 30 tablet 0   lisinopril-hydrochlorothiazide (PRINZIDE) 10-12.5 MG per tablet Take 1 tablet by mouth daily. (Patient taking differently: Take 2 tablets by mouth daily.) 30 tablet 12   metFORMIN (GLUCOPHAGE) 1000 MG tablet Take 1,000 mg by mouth 2 (two) times daily with a meal.     Multiple Vitamins-Minerals (MULTIVITAMIN WITH MINERALS) tablet Take 1 tablet by mouth daily. Centrum 50+ Mega Men     pravastatin (PRAVACHOL) 20 MG tablet Take 20 mg by mouth every Monday.     tadalafil (CIALIS) 5 MG tablet Take 1 tablet (5 mg total) by mouth daily as needed for erectile dysfunction. 30 tablet 12    No current facility-administered medications for this visit.     Objective: Vital signs in last 24 hours: BP (!) 148/76   Pulse 76  Intake/Output from previous day: No intake/output data recorded. Intake/Output this shift: @IOTHISSHIFT @   Physical Exam Vitals reviewed.  Constitutional:      Appearance: Normal appearance.  Neurological:     Mental Status: He is alert.     Lab Results:  No results found for this or any previous visit (from the past 24 hour(s)).     BMET No results for input(s): "NA", "K", "CL", "CO2", "GLUCOSE", "BUN", "CREATININE", "CALCIUM" in the last 72 hours. PT/INR No results for input(s): "LABPROT", "INR" in the last 72 hours. ABG No results for input(s): "PHART", "HCO3" in the last 72 hours.  Invalid input(s): "PCO2", "PO2"  Studies/Results: No results found.   Assessment/Plan: Post-void dribbling.   This remains improved wiith foreskin retraction at the time of voiding.  BPH with BOO and OAB wet.  He is doing well on tadalafil.    ED.  Tadalafil refilled. .    Meds ordered this encounter  Medications   tadalafil (CIALIS) 5 MG tablet    Sig: Take 1 tablet (5 mg total) by mouth daily as needed for erectile dysfunction.    Dispense:  30 tablet    Refill:  12      No orders of the defined types were placed in this encounter.     Return in about 1 year (around 10/22/2023).    CC: Dr. 10/24/2023.      Ass[place] Found 10/22/2022 10/24/2022 Patient ID: [id number], male   DOB: 1955-10-29, 67 y.o.   MRN: [id number]

## 2022-10-21 NOTE — Progress Notes (Deleted)
Subjective: No diagnosis found.     Consult requested by Dr. Sharilyn Sites.    [name] is a 67 yo male who is sent for incontinence.  He has primarily post void dribbling.  He has no stress incontinence.  He can have some minor UUI.  He has frequency q35-45 min which has started recently.  He has nocturia 2-3x.  He has no enuresis.   He has a good stream.  He feels like it empties. He was given tamsulosin but it seemed worse off of the med.  He has a history of ED and has used sildenafil.   He is circumcised.   He has no history of stones, UTI's or GU surgery.    His UA is clear and his PVR is 26m.   08/06/21:Phillip Spanishreturns today in f/u for a flow rate and cystoscopy to better assess his voiding symptoms.  He was concerned about a urine smell reported by his partner that he has discovered was from failure to retract the foreskin.  He does that now and doesn't have the odor.  He still has some frequency q2hr but has some UUI or rare occasion.  He has nocturia x 2-3.  He has ED that is responding to sildenafil.  11/05/21:Phillip Spanishreturns today in f/u.  He was changed to daily tadalafil at his last visit for his BPH and ED.  He is not taking it daily but it works the erections.   His iPSS today is 11 with nocturia x 3,  a reduced stream and a sensation of incomplete empyting.  He is reasonably content with his urinary symptoms.    10/21/22:Phillip Spanishreturns today in f/u.      ROS:  ROS  No Known Allergies  Past Medical History:  Diagnosis Date   CAD, multiple vessel 10/23/2014   Chronic back pain    Chronic hip pain    Diabetes mellitus    DM2 (diabetes mellitus, type 2) ([facility] 10/23/2014   GERD (gastroesophageal reflux disease)    Heart attack ([facility]    Hyperlipidemia    Hyperlipidemia LDL goal <70 10/23/2014   Hypertension    Tobacco use, stopped 6 months ago  10/23/2014    Past Surgical History:  Procedure Laterality Date   LEFT HEART CATHETERIZATION WITH CORONARY ANGIOGRAM  N/A 10/22/2014   Procedure: LEFT HEART CATHETERIZATION WITH CORONARY ANGIOGRAM;  Surgeon: JJettie Booze MD;  [facility];  Service: Cardiovascular;  Laterality: N/A;   PERCUTANEOUS CORONARY STENT INTERVENTION (PCI-S)  10/22/2014   Procedure: PERCUTANEOUS CORONARY STENT INTERVENTION (PCI-S);  Surgeon: JJettie Booze MD;  [facility];  Service: Cardiovascular;;  x3 to prox RCA , Prox CFX and dist CFX   TOTAL HIP ARTHROPLASTY Right    TOTAL HIP ARTHROPLASTY Left 04/27/2022   Procedure: TOTAL HIP ARTHROPLASTY;  Surgeon: HCarole Civil MD;  Location: [facility];  Service: Orthopedics;  Laterality: Left;    Social History   Socioeconomic History   Marital status: Married    Spouse name: Not on file   Number of children: 6   Years of education: Not on file   Highest education level: Not on file  Occupational History   Occupation: disabled  Tobacco Use   Smoking status: Former    Types: Cigarettes    Start date: 04/21/2014   Smokeless tobacco: Never  Substance and Sexual Activity   Alcohol use: Yes    Alcohol/week: 0.0 standard drinks of alcohol  Comment: drinks beer on weekends   Drug use: No   Sexual activity: Not on file  Other Topics Concern   Not on file  Social History Narrative   Not on file   Social Determinants of Health   Financial Resource Strain: Not on file  Food Insecurity: Not on file  Transportation Needs: Not on file  Physical Activity: Not on file  Stress: Not on file  Social Connections: Not on file  Intimate Partner Violence: Not on file    Family History  Problem Relation Age of Onset   Alzheimer's disease Mother    Heart disease Father    Heart disease Sister     Anti-infectives: Anti-infectives (From admission, onward)    None       Current Outpatient Medications  Medication Sig Dispense Refill   acetaminophen (TYLENOL) 500 MG tablet Take 1 tablet (500 mg total) by mouth every 6 (six) hours as needed. 90  tablet 0   aspirin EC 325 MG tablet Take 1 tablet (325 mg total) by mouth daily with breakfast. 35 tablet 0   docusate sodium (COLACE) 100 MG capsule Take 1 capsule (100 mg total) by mouth 2 (two) times daily. 10 capsule 0   gabapentin (NEURONTIN) 300 MG capsule Take 1 capsule (300 mg total) by mouth 3 (three) times daily. 90 capsule 5   HYDROcodone-acetaminophen (NORCO/VICODIN) 5-325 MG tablet Take 1 tablet by mouth at bedtime as needed for moderate pain. 30 tablet 0   lisinopril-hydrochlorothiazide (PRINZIDE) 10-12.5 MG per tablet Take 1 tablet by mouth daily. (Patient taking differently: Take 2 tablets by mouth daily.) 30 tablet 12   metFORMIN (GLUCOPHAGE) 1000 MG tablet Take 1,000 mg by mouth 2 (two) times daily with a meal.     methocarbamol (ROBAXIN) 500 MG tablet Take 1 tablet (500 mg total) by mouth every 6 (six) hours as needed for muscle spasms. 56 tablet 1   Multiple Vitamins-Minerals (MULTIVITAMIN WITH MINERALS) tablet Take 1 tablet by mouth daily. Centrum 50+ Mega Men     polyethylene glycol (MIRALAX / GLYCOLAX) 17 g packet Take 17 g by mouth daily as needed for mild constipation. 14 each 0   pravastatin (PRAVACHOL) 20 MG tablet Take 20 mg by mouth every Monday.     tadalafil (CIALIS) 5 MG tablet Take 1 tablet (5 mg total) by mouth daily as needed for erectile dysfunction. 30 tablet 0   No current facility-administered medications for this visit.     Objective: Vital signs in last 24 hours: There were no vitals taken for this visit.  Intake/Output from previous day: No intake/output data recorded. Intake/Output this shift: '@IOTHISSHIFT'$ @   Physical Exam  Lab Results:  No results found for this or any previous visit (from the past 24 hour(s)).     BMET No results for input(s): "NA", "K", "CL", "CO2", "GLUCOSE", "BUN", "CREATININE", "CALCIUM" in the last 72 hours. PT/INR No results for input(s): "LABPROT", "INR" in the last 72 hours. ABG No results for input(s):  "PHART", "HCO3" in the last 72 hours.  Invalid input(s): "PCO2", "PO2"  Studies/Results: No results found.   Assessment/Plan: Post-void dribbling.   This remains improved wiith foreskin retraction at the time of voiding.  BPH with BOO and OAB wet.   I encouraged him to take the tadalafil instead of prn to see if it will help the voiding symptoms.   I will see if his PCP has PSA's on him.   ED.  Prn tadalafil has been effective so hopefully  the daily will work for both problems.    No orders of the defined types were placed in this encounter.     No orders of the defined types were placed in this encounter.     No follow-ups on file.    CC: Dr. Sharilyn Sites.      Irine Seal 10/21/2022 [id number] Patient ID: Samara Snide, male   DOB: Jan 21, 1956, 67 y.o.   MRN: [id number]

## 2022-10-22 ENCOUNTER — Encounter: Payer: Self-pay | Admitting: Urology

## 2022-11-15 DIAGNOSIS — E1129 Type 2 diabetes mellitus with other diabetic kidney complication: Secondary | ICD-10-CM | POA: Diagnosis not present

## 2022-11-15 DIAGNOSIS — F4321 Adjustment disorder with depressed mood: Secondary | ICD-10-CM | POA: Diagnosis not present

## 2022-11-15 DIAGNOSIS — I10 Essential (primary) hypertension: Secondary | ICD-10-CM | POA: Diagnosis not present

## 2022-11-15 DIAGNOSIS — E6609 Other obesity due to excess calories: Secondary | ICD-10-CM | POA: Diagnosis not present

## 2022-11-15 DIAGNOSIS — G894 Chronic pain syndrome: Secondary | ICD-10-CM | POA: Diagnosis not present

## 2022-11-15 DIAGNOSIS — E1165 Type 2 diabetes mellitus with hyperglycemia: Secondary | ICD-10-CM | POA: Diagnosis not present

## 2022-11-15 DIAGNOSIS — Z6839 Body mass index (BMI) 39.0-39.9, adult: Secondary | ICD-10-CM | POA: Diagnosis not present

## 2022-11-25 ENCOUNTER — Encounter: Payer: Self-pay | Admitting: Radiology

## 2022-12-13 DIAGNOSIS — Z6841 Body Mass Index (BMI) 40.0 and over, adult: Secondary | ICD-10-CM | POA: Diagnosis not present

## 2022-12-13 DIAGNOSIS — G894 Chronic pain syndrome: Secondary | ICD-10-CM | POA: Diagnosis not present

## 2022-12-13 DIAGNOSIS — I10 Essential (primary) hypertension: Secondary | ICD-10-CM | POA: Diagnosis not present

## 2022-12-13 DIAGNOSIS — E1165 Type 2 diabetes mellitus with hyperglycemia: Secondary | ICD-10-CM | POA: Diagnosis not present

## 2022-12-13 DIAGNOSIS — E1129 Type 2 diabetes mellitus with other diabetic kidney complication: Secondary | ICD-10-CM | POA: Diagnosis not present

## 2023-01-13 DIAGNOSIS — Z6839 Body mass index (BMI) 39.0-39.9, adult: Secondary | ICD-10-CM | POA: Diagnosis not present

## 2023-01-13 DIAGNOSIS — G894 Chronic pain syndrome: Secondary | ICD-10-CM | POA: Diagnosis not present

## 2023-01-13 DIAGNOSIS — E6609 Other obesity due to excess calories: Secondary | ICD-10-CM | POA: Diagnosis not present

## 2023-02-11 DIAGNOSIS — I10 Essential (primary) hypertension: Secondary | ICD-10-CM | POA: Diagnosis not present

## 2023-02-11 DIAGNOSIS — E1129 Type 2 diabetes mellitus with other diabetic kidney complication: Secondary | ICD-10-CM | POA: Diagnosis not present

## 2023-02-11 DIAGNOSIS — G894 Chronic pain syndrome: Secondary | ICD-10-CM | POA: Diagnosis not present

## 2023-02-11 DIAGNOSIS — N401 Enlarged prostate with lower urinary tract symptoms: Secondary | ICD-10-CM | POA: Diagnosis not present

## 2023-02-11 DIAGNOSIS — Z6838 Body mass index (BMI) 38.0-38.9, adult: Secondary | ICD-10-CM | POA: Diagnosis not present

## 2023-02-11 DIAGNOSIS — E6609 Other obesity due to excess calories: Secondary | ICD-10-CM | POA: Diagnosis not present

## 2023-03-16 DIAGNOSIS — E7849 Other hyperlipidemia: Secondary | ICD-10-CM | POA: Diagnosis not present

## 2023-03-16 DIAGNOSIS — I2510 Atherosclerotic heart disease of native coronary artery without angina pectoris: Secondary | ICD-10-CM | POA: Diagnosis not present

## 2023-03-16 DIAGNOSIS — I10 Essential (primary) hypertension: Secondary | ICD-10-CM | POA: Diagnosis not present

## 2023-03-16 DIAGNOSIS — E1129 Type 2 diabetes mellitus with other diabetic kidney complication: Secondary | ICD-10-CM | POA: Diagnosis not present

## 2023-03-16 DIAGNOSIS — G894 Chronic pain syndrome: Secondary | ICD-10-CM | POA: Diagnosis not present

## 2023-03-16 DIAGNOSIS — E6609 Other obesity due to excess calories: Secondary | ICD-10-CM | POA: Diagnosis not present

## 2023-03-16 DIAGNOSIS — Z6838 Body mass index (BMI) 38.0-38.9, adult: Secondary | ICD-10-CM | POA: Diagnosis not present

## 2023-03-16 DIAGNOSIS — E782 Mixed hyperlipidemia: Secondary | ICD-10-CM | POA: Diagnosis not present

## 2023-04-15 DIAGNOSIS — Z6838 Body mass index (BMI) 38.0-38.9, adult: Secondary | ICD-10-CM | POA: Diagnosis not present

## 2023-04-15 DIAGNOSIS — E6609 Other obesity due to excess calories: Secondary | ICD-10-CM | POA: Diagnosis not present

## 2023-04-15 DIAGNOSIS — G894 Chronic pain syndrome: Secondary | ICD-10-CM | POA: Diagnosis not present

## 2023-04-25 ENCOUNTER — Ambulatory Visit: Payer: Medicare HMO | Admitting: Orthopedic Surgery

## 2023-05-16 DIAGNOSIS — Z6838 Body mass index (BMI) 38.0-38.9, adult: Secondary | ICD-10-CM | POA: Diagnosis not present

## 2023-05-16 DIAGNOSIS — E6609 Other obesity due to excess calories: Secondary | ICD-10-CM | POA: Diagnosis not present

## 2023-05-16 DIAGNOSIS — G894 Chronic pain syndrome: Secondary | ICD-10-CM | POA: Diagnosis not present

## 2023-06-16 DIAGNOSIS — Z6838 Body mass index (BMI) 38.0-38.9, adult: Secondary | ICD-10-CM | POA: Diagnosis not present

## 2023-06-16 DIAGNOSIS — G894 Chronic pain syndrome: Secondary | ICD-10-CM | POA: Diagnosis not present

## 2023-06-16 DIAGNOSIS — I10 Essential (primary) hypertension: Secondary | ICD-10-CM | POA: Diagnosis not present

## 2023-06-16 DIAGNOSIS — E1129 Type 2 diabetes mellitus with other diabetic kidney complication: Secondary | ICD-10-CM | POA: Diagnosis not present

## 2023-06-16 DIAGNOSIS — E1165 Type 2 diabetes mellitus with hyperglycemia: Secondary | ICD-10-CM | POA: Diagnosis not present

## 2023-06-16 DIAGNOSIS — N401 Enlarged prostate with lower urinary tract symptoms: Secondary | ICD-10-CM | POA: Diagnosis not present

## 2023-06-16 DIAGNOSIS — E6609 Other obesity due to excess calories: Secondary | ICD-10-CM | POA: Diagnosis not present

## 2023-07-15 DIAGNOSIS — E1165 Type 2 diabetes mellitus with hyperglycemia: Secondary | ICD-10-CM | POA: Diagnosis not present

## 2023-07-15 DIAGNOSIS — Z6838 Body mass index (BMI) 38.0-38.9, adult: Secondary | ICD-10-CM | POA: Diagnosis not present

## 2023-07-15 DIAGNOSIS — I10 Essential (primary) hypertension: Secondary | ICD-10-CM | POA: Diagnosis not present

## 2023-07-15 DIAGNOSIS — E6609 Other obesity due to excess calories: Secondary | ICD-10-CM | POA: Diagnosis not present

## 2023-07-15 DIAGNOSIS — G894 Chronic pain syndrome: Secondary | ICD-10-CM | POA: Diagnosis not present

## 2023-07-15 DIAGNOSIS — N401 Enlarged prostate with lower urinary tract symptoms: Secondary | ICD-10-CM | POA: Diagnosis not present

## 2023-07-15 DIAGNOSIS — Z23 Encounter for immunization: Secondary | ICD-10-CM | POA: Diagnosis not present

## 2023-07-15 DIAGNOSIS — M1612 Unilateral primary osteoarthritis, left hip: Secondary | ICD-10-CM | POA: Diagnosis not present

## 2023-07-15 DIAGNOSIS — E1129 Type 2 diabetes mellitus with other diabetic kidney complication: Secondary | ICD-10-CM | POA: Diagnosis not present

## 2023-08-15 DIAGNOSIS — E6609 Other obesity due to excess calories: Secondary | ICD-10-CM | POA: Diagnosis not present

## 2023-08-15 DIAGNOSIS — G894 Chronic pain syndrome: Secondary | ICD-10-CM | POA: Diagnosis not present

## 2023-08-15 DIAGNOSIS — Z6838 Body mass index (BMI) 38.0-38.9, adult: Secondary | ICD-10-CM | POA: Diagnosis not present

## 2023-09-14 DIAGNOSIS — G894 Chronic pain syndrome: Secondary | ICD-10-CM | POA: Diagnosis not present

## 2023-09-14 DIAGNOSIS — E1165 Type 2 diabetes mellitus with hyperglycemia: Secondary | ICD-10-CM | POA: Diagnosis not present

## 2023-09-14 DIAGNOSIS — I10 Essential (primary) hypertension: Secondary | ICD-10-CM | POA: Diagnosis not present

## 2023-09-14 DIAGNOSIS — E782 Mixed hyperlipidemia: Secondary | ICD-10-CM | POA: Diagnosis not present

## 2023-09-14 DIAGNOSIS — E1129 Type 2 diabetes mellitus with other diabetic kidney complication: Secondary | ICD-10-CM | POA: Diagnosis not present

## 2023-09-14 DIAGNOSIS — Z6838 Body mass index (BMI) 38.0-38.9, adult: Secondary | ICD-10-CM | POA: Diagnosis not present

## 2023-09-14 DIAGNOSIS — N401 Enlarged prostate with lower urinary tract symptoms: Secondary | ICD-10-CM | POA: Diagnosis not present

## 2023-09-14 DIAGNOSIS — E6609 Other obesity due to excess calories: Secondary | ICD-10-CM | POA: Diagnosis not present

## 2023-10-06 DIAGNOSIS — E6609 Other obesity due to excess calories: Secondary | ICD-10-CM | POA: Diagnosis not present

## 2023-10-06 DIAGNOSIS — Z125 Encounter for screening for malignant neoplasm of prostate: Secondary | ICD-10-CM | POA: Diagnosis not present

## 2023-10-06 DIAGNOSIS — G894 Chronic pain syndrome: Secondary | ICD-10-CM | POA: Diagnosis not present

## 2023-10-06 DIAGNOSIS — Z0001 Encounter for general adult medical examination with abnormal findings: Secondary | ICD-10-CM | POA: Diagnosis not present

## 2023-10-06 DIAGNOSIS — Z6838 Body mass index (BMI) 38.0-38.9, adult: Secondary | ICD-10-CM | POA: Diagnosis not present

## 2023-10-06 DIAGNOSIS — I10 Essential (primary) hypertension: Secondary | ICD-10-CM | POA: Diagnosis not present

## 2023-10-06 DIAGNOSIS — J069 Acute upper respiratory infection, unspecified: Secondary | ICD-10-CM | POA: Diagnosis not present

## 2023-10-06 DIAGNOSIS — I2510 Atherosclerotic heart disease of native coronary artery without angina pectoris: Secondary | ICD-10-CM | POA: Diagnosis not present

## 2023-10-06 DIAGNOSIS — E1165 Type 2 diabetes mellitus with hyperglycemia: Secondary | ICD-10-CM | POA: Diagnosis not present

## 2023-10-06 DIAGNOSIS — E782 Mixed hyperlipidemia: Secondary | ICD-10-CM | POA: Diagnosis not present

## 2023-10-14 DIAGNOSIS — E1129 Type 2 diabetes mellitus with other diabetic kidney complication: Secondary | ICD-10-CM | POA: Diagnosis not present

## 2023-10-14 DIAGNOSIS — N400 Enlarged prostate without lower urinary tract symptoms: Secondary | ICD-10-CM | POA: Diagnosis not present

## 2023-10-14 DIAGNOSIS — E1165 Type 2 diabetes mellitus with hyperglycemia: Secondary | ICD-10-CM | POA: Diagnosis not present

## 2023-10-14 DIAGNOSIS — Z6838 Body mass index (BMI) 38.0-38.9, adult: Secondary | ICD-10-CM | POA: Diagnosis not present

## 2023-10-14 DIAGNOSIS — G894 Chronic pain syndrome: Secondary | ICD-10-CM | POA: Diagnosis not present

## 2023-10-14 DIAGNOSIS — I10 Essential (primary) hypertension: Secondary | ICD-10-CM | POA: Diagnosis not present

## 2023-10-14 DIAGNOSIS — E6609 Other obesity due to excess calories: Secondary | ICD-10-CM | POA: Diagnosis not present

## 2023-10-20 ENCOUNTER — Ambulatory Visit: Payer: Medicare HMO | Admitting: Urology

## 2023-10-20 VITALS — BP 131/76 | HR 68

## 2023-10-20 DIAGNOSIS — N3943 Post-void dribbling: Secondary | ICD-10-CM

## 2023-10-20 DIAGNOSIS — N5201 Erectile dysfunction due to arterial insufficiency: Secondary | ICD-10-CM | POA: Diagnosis not present

## 2023-10-20 DIAGNOSIS — N3941 Urge incontinence: Secondary | ICD-10-CM

## 2023-10-20 DIAGNOSIS — N401 Enlarged prostate with lower urinary tract symptoms: Secondary | ICD-10-CM | POA: Diagnosis not present

## 2023-10-20 DIAGNOSIS — R351 Nocturia: Secondary | ICD-10-CM

## 2023-10-20 LAB — URINALYSIS, ROUTINE W REFLEX MICROSCOPIC
Bilirubin, UA: NEGATIVE
Glucose, UA: NEGATIVE
Ketones, UA: NEGATIVE
Leukocytes,UA: NEGATIVE
Nitrite, UA: NEGATIVE
Protein,UA: NEGATIVE
RBC, UA: NEGATIVE
Specific Gravity, UA: 1.01 (ref 1.005–1.030)
Urobilinogen, Ur: 0.2 mg/dL (ref 0.2–1.0)
pH, UA: 6 (ref 5.0–7.5)

## 2023-10-20 MED ORDER — TADALAFIL 5 MG PO TABS: 5.0000 mg | ORAL_TABLET | Freq: Every day | ORAL | 12 refills | Status: AC | PRN

## 2023-10-20 NOTE — Progress Notes (Signed)
Subjective: 1. Erectile dysfunction due to arterial insufficiency   2. Benign prostatic hyperplasia (BPH) with post-void dribbling   3. Nocturia   4. Urge incontinence       Consult requested by Dr. Assunta Found.    [name] is a 68 yo male who is sent for incontinence.  He has primarily post void dribbling.  He has no stress incontinence.  He can have some minor UUI.  He has frequency q35-45 min which has started recently.  He has nocturia 2-3x.  He has no enuresis.   He has a good stream.  He feels like it empties. He was given tamsulosin but it seemed worse off of the med.  He has a history of ED and has used sildenafil.   He is circumcised.   He has no history of stones, UTI's or GU surgery.    His UA is clear and his PVR is 92ml.   11/10/22[name] returns today in f/u for a flow rate and cystoscopy to better assess his voiding symptoms.  He was concerned about a urine smell reported by his partner that he has discovered was from failure to retract the foreskin.  He does that now and doesn't have the odor.  He still has some frequency q2hr but has some UUI or rare occasion.  He has nocturia x 2-3.  He has ED that is responding to sildenafil.  2/9/23[name] returns today in f/u.  He was changed to daily tadalafil at his last visit for his BPH and ED.  He is not taking it daily but it works the erections.   His iPSS today is 11 with nocturia x 3,  a reduced stream and a sensation of incomplete empyting.  He is reasonably content with his urinary symptoms.    1/25/24[name] returns today in f/u.  He has been on tadalafil daily for ED and BPH.  He needs a refill.  His IPSS is 5 with nocturia x 2.   Dr. Phillips Odor has been checking PSA's.   10/20/23: [name] returns today in f/u for his history of ED and BPH with BOO for which he has been on daily tadalafil.  Dr. Phillips Odor has been checking his PSA's.  His UA is clear.  He continues to respond to the med.   He is voiding well.        ROS:  Review of Systems  Musculoskeletal:  Positive for joint pain.  All other systems reviewed and are negative.   No Known Allergies  Past Medical History:  Diagnosis Date   CAD, multiple vessel 10/23/2014   Chronic back pain    Chronic hip pain    Diabetes mellitus    DM2 (diabetes mellitus, type 2) (HCC) 10/23/2014   GERD (gastroesophageal reflux disease)    Heart attack (H[facility])    Hyperlipidemia    Hyperlipidemia LDL goal <70 10/23/2014   Hypertension    Tobacco use, stopped 6 months ago  10/23/2014    Past Surgical History:  Procedure Laterality Date   LEFT HEART CATHETERIZATION WITH CORONARY ANGIOGRAM N/A 10/22/2014   Procedure: LEFT HEART CATHETERIZATION WITH CORONARY ANGIOGRAM;  Surgeon: Corky Crafts, MD;  Location: [facility] CATH LAB;  Service: Cardiovascular;  Laterality: N/A;   PERCUTANEOUS CORONARY STENT INTERVENTION (PCI-S)  10/22/2014   Procedure: PERCUTANEOUS CORONARY STENT INTERVENTION (PCI-S);  Surgeon: Corky Crafts, MD;  Location: [facility] CATH LAB;  Service: Cardiovascular;;  x3 to prox RCA , Prox CFX and dist CFX   TOTAL HIP  ARTHROPLASTY Right    TOTAL HIP ARTHROPLASTY Left 04/27/2022   Procedure: TOTAL HIP ARTHROPLASTY;  Surgeon: Vickki Hearing, MD;  Location: [facility];  Service: Orthopedics;  Laterality: Left;    Social History   Socioeconomic History   Marital status: Married    Spouse name: Not on file   Number of children: 6   Years of education: Not on file   Highest education level: Not on file  Occupational History   Occupation: disabled  Tobacco Use   Smoking status: Former    Types: Cigarettes    Start date: 04/21/2014   Smokeless tobacco: Never  Substance and Sexual Activity   Alcohol use: Yes    Alcohol/week: 0.0 standard drinks of alcohol    Comment: drinks beer on weekends   Drug use: No   Sexual activity: Not on file  Other Topics Concern   Not on file  Social History Narrative   Not on file   Social Drivers of  Health   Financial Resource Strain: Not on file  Food Insecurity: Not on file  Transportation Needs: Not on file  Physical Activity: Not on file  Stress: Not on file  Social Connections: Not on file  Intimate Partner Violence: Not on file    Family History  Problem Relation Age of Onset   Alzheimer's disease Mother    Heart disease Father    Heart disease Sister     Anti-infectives: Anti-infectives (From admission, onward)    None       Current Outpatient Medications  Medication Sig Dispense Refill   acetaminophen (TYLENOL) 500 MG tablet Take 1 tablet (500 mg total) by mouth every 6 (six) hours as needed. 90 tablet 0   HYDROcodone-acetaminophen (NORCO/VICODIN) 5-325 MG tablet Take 1 tablet by mouth at bedtime as needed for moderate pain. 30 tablet 0   lisinopril-hydrochlorothiazide (PRINZIDE) 10-12.5 MG per tablet Take 1 tablet by mouth daily. (Patient taking differently: Take 2 tablets by mouth daily.) 30 tablet 12   metFORMIN (GLUCOPHAGE) 1000 MG tablet Take 1,000 mg by mouth 2 (two) times daily with a meal.     Multiple Vitamins-Minerals (MULTIVITAMIN WITH MINERALS) tablet Take 1 tablet by mouth daily. Centrum 50+ Mega Men     pravastatin (PRAVACHOL) 20 MG tablet Take 20 mg by mouth every Monday.     tadalafil (CIALIS) 5 MG tablet Take 1 tablet (5 mg total) by mouth daily as needed for erectile dysfunction. 30 tablet 12   No current facility-administered medications for this visit.     Objective: Vital signs in last 24 hours: BP 131/76   Pulse 68   Intake/Output from previous day: No intake/output data recorded. Intake/Output this shift: @IOTHISSHIFT @   Physical Exam Vitals reviewed.  Constitutional:      Appearance: Normal appearance.  Neurological:     Mental Status: He is alert.     Lab Results:  Results for orders placed or performed in visit on 10/20/23 (from the past 24 hours)  Urinalysis, Routine w reflex microscopic     Status: None    Collection Time: 10/20/23  1:08 PM  Result Value Ref Range   Specific Gravity, UA 1.010 1.005 - 1.030   pH, UA 6.0 5.0 - 7.5   Color, UA Yellow Yellow   Appearance Ur Clear Clear   Leukocytes,UA Negative Negative   Protein,UA Negative Negative/Trace   Glucose, UA Negative Negative   Ketones, UA Negative Negative   RBC, UA Negative Negative   Bilirubin, UA Negative  Negative   Urobilinogen, Ur 0.2 0.2 - 1.0 mg/dL   Nitrite, UA Negative Negative   Microscopic Examination Comment    Narrative   Performed at:  [address] - [facility] [address], [place], [place]  [id number] Lab Director: Chinita Pester MT, Phone:  [phone]       BMET No results for input(s): "NA", "K", "CL", "CO2", "GLUCOSE", "BUN", "CREATININE", "CALCIUM" in the last 72 hours. PT/INR No results for input(s): "LABPROT", "INR" in the last 72 hours. ABG No results for input(s): "PHART", "HCO3" in the last 72 hours.  Invalid input(s): "PCO2", "PO2"  Studies/Results: No results found.   Assessment/Plan: Post-void dribbling.   This remains improved wiith foreskin retraction at the time of voiding.  BPH with BOO and OAB wet.  He is doing well on tadalafil.    ED.  Tadalafil refilled. .    Meds ordered this encounter  Medications   tadalafil (CIALIS) 5 MG tablet    Sig: Take 1 tablet (5 mg total) by mouth daily as needed for erectile dysfunction.    Dispense:  30 tablet    Refill:  12      Orders Placed This Encounter  Procedures   Urinalysis, Routine w reflex microscopic      Return in about 1 year (around 10/19/2024).    CC: Dr. Assunta Found.      [name] 10/21/2023 [phone] Patient ID: [name], male   DOB: Oct 11, 1955, 68 y.o.   MRN: [id number]

## 2023-11-15 DIAGNOSIS — G894 Chronic pain syndrome: Secondary | ICD-10-CM | POA: Diagnosis not present

## 2023-11-15 DIAGNOSIS — M1712 Unilateral primary osteoarthritis, left knee: Secondary | ICD-10-CM | POA: Diagnosis not present

## 2023-11-15 DIAGNOSIS — E1165 Type 2 diabetes mellitus with hyperglycemia: Secondary | ICD-10-CM | POA: Diagnosis not present

## 2023-11-15 DIAGNOSIS — M1612 Unilateral primary osteoarthritis, left hip: Secondary | ICD-10-CM | POA: Diagnosis not present

## 2023-11-15 DIAGNOSIS — I10 Essential (primary) hypertension: Secondary | ICD-10-CM | POA: Diagnosis not present

## 2023-11-15 DIAGNOSIS — E1129 Type 2 diabetes mellitus with other diabetic kidney complication: Secondary | ICD-10-CM | POA: Diagnosis not present

## 2023-11-15 DIAGNOSIS — Z6838 Body mass index (BMI) 38.0-38.9, adult: Secondary | ICD-10-CM | POA: Diagnosis not present

## 2023-11-15 DIAGNOSIS — E6609 Other obesity due to excess calories: Secondary | ICD-10-CM | POA: Diagnosis not present

## 2023-11-15 DIAGNOSIS — N400 Enlarged prostate without lower urinary tract symptoms: Secondary | ICD-10-CM | POA: Diagnosis not present

## 2023-11-22 DIAGNOSIS — J101 Influenza due to other identified influenza virus with other respiratory manifestations: Secondary | ICD-10-CM | POA: Diagnosis not present

## 2023-11-22 DIAGNOSIS — E6609 Other obesity due to excess calories: Secondary | ICD-10-CM | POA: Diagnosis not present

## 2023-11-22 DIAGNOSIS — Z20828 Contact with and (suspected) exposure to other viral communicable diseases: Secondary | ICD-10-CM | POA: Diagnosis not present

## 2023-11-22 DIAGNOSIS — Z6837 Body mass index (BMI) 37.0-37.9, adult: Secondary | ICD-10-CM | POA: Diagnosis not present

## 2023-11-22 DIAGNOSIS — E039 Hypothyroidism, unspecified: Secondary | ICD-10-CM | POA: Diagnosis not present

## 2023-12-13 DIAGNOSIS — G894 Chronic pain syndrome: Secondary | ICD-10-CM | POA: Diagnosis not present

## 2023-12-13 DIAGNOSIS — E1129 Type 2 diabetes mellitus with other diabetic kidney complication: Secondary | ICD-10-CM | POA: Diagnosis not present

## 2023-12-13 DIAGNOSIS — M160 Bilateral primary osteoarthritis of hip: Secondary | ICD-10-CM | POA: Diagnosis not present

## 2023-12-13 DIAGNOSIS — E1165 Type 2 diabetes mellitus with hyperglycemia: Secondary | ICD-10-CM | POA: Diagnosis not present

## 2023-12-13 DIAGNOSIS — E039 Hypothyroidism, unspecified: Secondary | ICD-10-CM | POA: Diagnosis not present

## 2023-12-13 DIAGNOSIS — E6609 Other obesity due to excess calories: Secondary | ICD-10-CM | POA: Diagnosis not present

## 2023-12-13 DIAGNOSIS — I10 Essential (primary) hypertension: Secondary | ICD-10-CM | POA: Diagnosis not present

## 2023-12-13 DIAGNOSIS — Z6839 Body mass index (BMI) 39.0-39.9, adult: Secondary | ICD-10-CM | POA: Diagnosis not present

## 2024-01-12 DIAGNOSIS — E1165 Type 2 diabetes mellitus with hyperglycemia: Secondary | ICD-10-CM | POA: Diagnosis not present

## 2024-01-12 DIAGNOSIS — G894 Chronic pain syndrome: Secondary | ICD-10-CM | POA: Diagnosis not present

## 2024-01-12 DIAGNOSIS — M160 Bilateral primary osteoarthritis of hip: Secondary | ICD-10-CM | POA: Diagnosis not present

## 2024-01-12 DIAGNOSIS — I10 Essential (primary) hypertension: Secondary | ICD-10-CM | POA: Diagnosis not present

## 2024-01-12 DIAGNOSIS — E1129 Type 2 diabetes mellitus with other diabetic kidney complication: Secondary | ICD-10-CM | POA: Diagnosis not present

## 2024-02-15 DIAGNOSIS — G894 Chronic pain syndrome: Secondary | ICD-10-CM | POA: Diagnosis not present

## 2024-02-15 DIAGNOSIS — E6609 Other obesity due to excess calories: Secondary | ICD-10-CM | POA: Diagnosis not present

## 2024-02-15 DIAGNOSIS — Z6838 Body mass index (BMI) 38.0-38.9, adult: Secondary | ICD-10-CM | POA: Diagnosis not present

## 2024-03-20 DIAGNOSIS — G894 Chronic pain syndrome: Secondary | ICD-10-CM | POA: Diagnosis not present

## 2024-03-20 DIAGNOSIS — E1129 Type 2 diabetes mellitus with other diabetic kidney complication: Secondary | ICD-10-CM | POA: Diagnosis not present

## 2024-03-20 DIAGNOSIS — Z6837 Body mass index (BMI) 37.0-37.9, adult: Secondary | ICD-10-CM | POA: Diagnosis not present

## 2024-03-20 DIAGNOSIS — E1165 Type 2 diabetes mellitus with hyperglycemia: Secondary | ICD-10-CM | POA: Diagnosis not present

## 2024-03-20 DIAGNOSIS — I10 Essential (primary) hypertension: Secondary | ICD-10-CM | POA: Diagnosis not present

## 2024-03-20 DIAGNOSIS — M160 Bilateral primary osteoarthritis of hip: Secondary | ICD-10-CM | POA: Diagnosis not present

## 2024-03-20 DIAGNOSIS — N401 Enlarged prostate with lower urinary tract symptoms: Secondary | ICD-10-CM | POA: Diagnosis not present

## 2024-03-20 DIAGNOSIS — E6609 Other obesity due to excess calories: Secondary | ICD-10-CM | POA: Diagnosis not present

## 2024-04-16 DIAGNOSIS — N401 Enlarged prostate with lower urinary tract symptoms: Secondary | ICD-10-CM | POA: Diagnosis not present

## 2024-04-16 DIAGNOSIS — G894 Chronic pain syndrome: Secondary | ICD-10-CM | POA: Diagnosis not present

## 2024-04-16 DIAGNOSIS — E1165 Type 2 diabetes mellitus with hyperglycemia: Secondary | ICD-10-CM | POA: Diagnosis not present

## 2024-04-16 DIAGNOSIS — I10 Essential (primary) hypertension: Secondary | ICD-10-CM | POA: Diagnosis not present

## 2024-04-16 DIAGNOSIS — M160 Bilateral primary osteoarthritis of hip: Secondary | ICD-10-CM | POA: Diagnosis not present

## 2024-04-16 DIAGNOSIS — Z6837 Body mass index (BMI) 37.0-37.9, adult: Secondary | ICD-10-CM | POA: Diagnosis not present

## 2024-04-16 DIAGNOSIS — E1129 Type 2 diabetes mellitus with other diabetic kidney complication: Secondary | ICD-10-CM | POA: Diagnosis not present

## 2024-05-18 DIAGNOSIS — E1165 Type 2 diabetes mellitus with hyperglycemia: Secondary | ICD-10-CM | POA: Diagnosis not present

## 2024-05-18 DIAGNOSIS — I10 Essential (primary) hypertension: Secondary | ICD-10-CM | POA: Diagnosis not present

## 2024-05-18 DIAGNOSIS — Z6837 Body mass index (BMI) 37.0-37.9, adult: Secondary | ICD-10-CM | POA: Diagnosis not present

## 2024-05-18 DIAGNOSIS — N401 Enlarged prostate with lower urinary tract symptoms: Secondary | ICD-10-CM | POA: Diagnosis not present

## 2024-05-18 DIAGNOSIS — E6609 Other obesity due to excess calories: Secondary | ICD-10-CM | POA: Diagnosis not present

## 2024-05-18 DIAGNOSIS — M160 Bilateral primary osteoarthritis of hip: Secondary | ICD-10-CM | POA: Diagnosis not present

## 2024-05-18 DIAGNOSIS — E1129 Type 2 diabetes mellitus with other diabetic kidney complication: Secondary | ICD-10-CM | POA: Diagnosis not present

## 2024-05-18 DIAGNOSIS — G894 Chronic pain syndrome: Secondary | ICD-10-CM | POA: Diagnosis not present

## 2024-05-22 ENCOUNTER — Telehealth: Payer: Self-pay

## 2024-05-22 NOTE — Telephone Encounter (Signed)
 Copied from CRM #[id number]. Topic: General - Other >> May 21, 2024  4:42 PM Lavanda D wrote: Reason for CRM: Patient is calling because he has a pending request to have his medical records sent from [facility] to [place]. He has a new pt office visit scheduled and is wondering what steps he needs to take to get documentation that he has been accepted as a new patient to provide to [place].

## 2024-05-22 NOTE — Telephone Encounter (Signed)
 LVM to inform pt we will need medical record release form signed to send to [place] or he can go to [place] and have them fax them over to us .

## 2024-08-08 ENCOUNTER — Ambulatory Visit (INDEPENDENT_AMBULATORY_CARE_PROVIDER_SITE_OTHER): Payer: Self-pay

## 2024-08-08 VITALS — BP 147/82 | HR 75 | Ht 70.0 in | Wt 264.1 lb

## 2024-08-08 DIAGNOSIS — E119 Type 2 diabetes mellitus without complications: Secondary | ICD-10-CM

## 2024-08-08 DIAGNOSIS — Z7689 Persons encountering health services in other specified circumstances: Secondary | ICD-10-CM

## 2024-08-08 DIAGNOSIS — I2510 Atherosclerotic heart disease of native coronary artery without angina pectoris: Secondary | ICD-10-CM

## 2024-08-08 DIAGNOSIS — M167 Other unilateral secondary osteoarthritis of hip: Secondary | ICD-10-CM | POA: Diagnosis not present

## 2024-08-08 DIAGNOSIS — E785 Hyperlipidemia, unspecified: Secondary | ICD-10-CM | POA: Diagnosis not present

## 2024-08-08 DIAGNOSIS — E66812 Obesity, class 2: Secondary | ICD-10-CM

## 2024-08-08 DIAGNOSIS — Z7984 Long term (current) use of oral hypoglycemic drugs: Secondary | ICD-10-CM

## 2024-08-08 DIAGNOSIS — I10 Essential (primary) hypertension: Secondary | ICD-10-CM

## 2024-08-08 MED ORDER — PRAVASTATIN SODIUM 20 MG PO TABS: 20.0000 mg | ORAL_TABLET | ORAL | 8 refills | Status: AC

## 2024-08-08 MED ORDER — LISINOPRIL-HYDROCHLOROTHIAZIDE 10-12.5 MG PO TABS
2.0000 | ORAL_TABLET | Freq: Every day | ORAL | 8 refills | Status: AC
Start: 2024-08-08 — End: ?

## 2024-08-08 MED ORDER — OZEMPIC (2 MG/DOSE) 8 MG/3ML ~~LOC~~ SOPN: 2.0000 mg | PEN_INJECTOR | SUBCUTANEOUS | 5 refills | Status: AC

## 2024-08-08 MED ORDER — METFORMIN HCL 1000 MG PO TABS: 1000.0000 mg | ORAL_TABLET | Freq: Two times a day (BID) | ORAL | 8 refills | Status: AC

## 2024-08-08 NOTE — Progress Notes (Unsigned)
 New Patient Office Visit  Subjective    Patient ID: [name], male    DOB: September 26, 1956  Age: 68 y.o. MRN: [id number]  CC:  Chief Complaint  Patient presents with   Establish Care    HPI [name] presents to establish care Discussed the use of AI scribe software for clinical note transcription with the patient, who gave verbal consent to proceed.  History of Present Illness    [name] is a 68 year old male with hypertension and diabetes who presents for establishment of care and management of high blood pressure.  Hypertension - Elevated blood pressure readings during recent checks - Currently taking lisinopril  and hydrochlorothiazide  daily - No home blood pressure monitoring - Blood pressure has been elevated for an extended period  Diabetes mellitus type 2 - Managed with metformin  and Ozempic - No recent laboratory work available  Coronary artery disease - History of coronary artery bypass grafting approximately 12 years ago - Cardiac catheterization in 2016 - Takes 81 mg aspirin  daily as part of cardiac regimen - No current cardiology follow-up  Chronic left leg pain status post hip replacement - History of hip replacement surgery with initial failure requiring additional interventions - Persistent chronic pain in the left leg for several years - Managed with hydrocodone       Outpatient Encounter Medications as of 08/08/2024  Medication Sig   aspirin  EC 81 MG tablet Take 81 mg by mouth daily. Swallow whole.   HYDROcodone -acetaminophen  (NORCO/VICODIN) 5-325 MG tablet Take 1 tablet by mouth at bedtime as needed for moderate pain.   tadalafil  (CIALIS ) 5 MG tablet Take 1 tablet (5 mg total) by mouth daily as needed for erectile dysfunction.   [DISCONTINUED] lisinopril -hydrochlorothiazide  (PRINZIDE ) 10-12.5 MG per tablet Take 1 tablet by mouth daily. (Patient taking differently: Take 2 tablets by mouth daily.)   [DISCONTINUED] metFORMIN   (GLUCOPHAGE ) 1000 MG tablet Take 1,000 mg by mouth 2 (two) times daily with a meal.   [DISCONTINUED] OZEMPIC, 2 MG/DOSE, 8 MG/3ML SOPN Inject 2 mg into the skin once a week.   [DISCONTINUED] pravastatin  (PRAVACHOL ) 20 MG tablet Take 20 mg by mouth every Monday.   lisinopril -hydrochlorothiazide  (PRINZIDE ) 10-12.5 MG tablet Take 2 tablets by mouth daily.   metFORMIN  (GLUCOPHAGE ) 1000 MG tablet Take 1 tablet (1,000 mg total) by mouth 2 (two) times daily with a meal.   OZEMPIC, 2 MG/DOSE, 8 MG/3ML SOPN Inject 2 mg into the skin once a week.   [START ON 08/13/2024] pravastatin  (PRAVACHOL ) 20 MG tablet Take 1 tablet (20 mg total) by mouth every Monday.   [DISCONTINUED] acetaminophen  (TYLENOL ) 500 MG tablet Take 1 tablet (500 mg total) by mouth every 6 (six) hours as needed. (Patient not taking: Reported on 08/08/2024)   [DISCONTINUED] Multiple Vitamins-Minerals (MULTIVITAMIN WITH MINERALS) tablet Take 1 tablet by mouth daily. Centrum 50+ Mega Men (Patient not taking: Reported on 08/08/2024)   No facility-administered encounter medications on file as of 08/08/2024.    Past Medical History:  Diagnosis Date   CAD, multiple vessel 10/23/2014   Chronic back pain    Chronic hip pain    Diabetes mellitus    DM2 (diabetes mellitus, type 2) (HCC) 10/23/2014   GERD (gastroesophageal reflux disease)    Heart attack (HCC)    Hyperlipidemia    Hyperlipidemia LDL goal <70 10/23/2014   Hypertension    Tobacco use, stopped 6 months ago  10/23/2014    Past Surgical History:  Procedure Laterality Date  LEFT HEART CATHETERIZATION WITH CORONARY ANGIOGRAM N/A 10/22/2014   Procedure: LEFT HEART CATHETERIZATION WITH CORONARY ANGIOGRAM;  Surgeon: Candyce GORMAN Reek, MD;  Location: [facility] CATH LAB;  Service: Cardiovascular;  Laterality: N/A;   PERCUTANEOUS CORONARY STENT INTERVENTION (PCI-S)  10/22/2014   Procedure: PERCUTANEOUS CORONARY STENT INTERVENTION (PCI-S);  Surgeon: Candyce GORMAN Reek, MD;  Location: [facility] CATH  LAB;  Service: Cardiovascular;;  x3 to prox RCA , Prox CFX and dist CFX   TOTAL HIP ARTHROPLASTY Right    TOTAL HIP ARTHROPLASTY Left 04/27/2022   Procedure: TOTAL HIP ARTHROPLASTY;  Surgeon: Margrette Taft BRAVO, MD;  Location: [facility];  Service: Orthopedics;  Laterality: Left;    Family History  Problem Relation Age of Onset   Alzheimer's disease Mother    Heart disease Father    Heart disease Sister     Social History   Socioeconomic History   Marital status: Married    Spouse name: Not on file   Number of children: 6   Years of education: Not on file   Highest education level: Not on file  Occupational History   Occupation: disabled  Tobacco Use   Smoking status: Former    Types: Cigarettes    Start date: 04/21/2014   Smokeless tobacco: Never  Substance and Sexual Activity   Alcohol use: Yes    Alcohol/week: 0.0 standard drinks of alcohol    Comment: drinks beer on weekends   Drug use: No   Sexual activity: Not on file  Other Topics Concern   Not on file  Social History Narrative   Not on file   Social Drivers of Health   Financial Resource Strain: Not on file  Food Insecurity: Not on file  Transportation Needs: Not on file  Physical Activity: Not on file  Stress: Not on file  Social Connections: Not on file  Intimate Partner Violence: Not on file    ROS      Objective    BP (!) 147/82   Pulse 75   Ht 5' 10 (1.778 m)   Wt 264 lb 1.3 oz (119.8 kg)   SpO2 97%   BMI 37.89 kg/m   Physical Exam Vitals and nursing note reviewed.  Constitutional:      Appearance: Normal appearance. He is obese.  HENT:     Head: Normocephalic.  Eyes:     Extraocular Movements: Extraocular movements intact.     Pupils: Pupils are equal, round, and reactive to light.  Cardiovascular:     Rate and Rhythm: Normal rate and regular rhythm.  Pulmonary:     Effort: Pulmonary effort is normal.     Breath sounds: Normal breath sounds.  Musculoskeletal:     Cervical back:  Normal range of motion and neck supple.     Right hip: Normal.     Left hip: Tenderness present. Decreased range of motion. Decreased strength.  Neurological:     Mental Status: He is alert and oriented to person, place, and time.  Psychiatric:        Mood and Affect: Mood normal.        Thought Content: Thought content normal.         Assessment & Plan:   Problem List Items Addressed This Visit       Cardiovascular and Mediastinum   CAD, multiple vessel, s/p DES to pLCX and dLCX, and pRCA 10/22/14   Atherosclerotic heart disease of native coronary artery, status post coronary artery bypass grafting and stenting Status post coronary  artery bypass grafting and stenting. On 81 mg daily aspirin . - Continue 81 mg daily aspirin .      Relevant Medications   aspirin  EC 81 MG tablet   lisinopril -hydrochlorothiazide  (PRINZIDE ) 10-12.5 MG tablet   pravastatin  (PRAVACHOL ) 20 MG tablet (Start on 08/13/2024)   Primary hypertension   Blood pressure elevated at 132/82 mmHg. On lisinopril  and hydrochlorothiazide . - Rechecked blood pressure. - Continue lisinopril  and hydrochlorothiazide .      Relevant Medications   aspirin  EC 81 MG tablet   lisinopril -hydrochlorothiazide  (PRINZIDE ) 10-12.5 MG tablet   pravastatin  (PRAVACHOL ) 20 MG tablet (Start on 08/13/2024)     Endocrine   DM2 (diabetes mellitus, type 2) (HCC) - Primary (Chronic)   Managed with metformin  and Ozempic.  No medication changes made today. Update fasting labs. - Continue metformin  and Ozempic.      Relevant Medications   aspirin  EC 81 MG tablet   lisinopril -hydrochlorothiazide  (PRINZIDE ) 10-12.5 MG tablet   metFORMIN  (GLUCOPHAGE ) 1000 MG tablet   OZEMPIC, 2 MG/DOSE, 8 MG/3ML SOPN   pravastatin  (PRAVACHOL ) 20 MG tablet (Start on 08/13/2024)   Other Relevant Orders   CMP14+EGFR (Completed)   HgB A1c (Completed)     Musculoskeletal and Integument   Osteoarthritis of left hip   Chronic pain in left hip  post-replacement. On hydrocodone . Referral to pain management discussed. - Referred to pain management for chronic hip pain.      Relevant Medications   aspirin  EC 81 MG tablet   Other Relevant Orders   Ambulatory referral to Pain Clinic     Other   Hyperlipidemia LDL goal <70   Managed with pravastatin  20 mg.  Update fasting labs today.  No medication changes made at this time      Relevant Medications   aspirin  EC 81 MG tablet   lisinopril -hydrochlorothiazide  (PRINZIDE ) 10-12.5 MG tablet   pravastatin  (PRAVACHOL ) 20 MG tablet (Start on 08/13/2024)   Other Relevant Orders   CMP14+EGFR (Completed)   Lipid Profile (Completed)   Obesity, Class II, BMI 35-39.9   Discussed need for weight reduction with healthy diet and regular exercise, which is somewhat limited due to his chronic hip pain.      Other Visit Diagnoses       Encounter to establish care           Return in about 6 months (around 02/05/2025) for chronic follow-up with PCP.   Leita Longs, FNP

## 2024-08-09 LAB — HEMOGLOBIN A1C
Est. average glucose Bld gHb Est-mCnc: 126 mg/dL
Hgb A1c MFr Bld: 6 % — ABNORMAL HIGH (ref 4.8–5.6)

## 2024-08-09 LAB — LIPID PANEL
Chol/HDL Ratio: 3.1 ratio (ref 0.0–5.0)
Cholesterol, Total: 163 mg/dL (ref 100–199)
HDL: 52 mg/dL (ref >39)
LDL Chol Calc (NIH): 100 mg/dL — ABNORMAL HIGH (ref 0–99)
Triglycerides: 57 mg/dL (ref 0–149)
VLDL Cholesterol Cal: 11 mg/dL (ref 5–40)

## 2024-08-09 LAB — CMP14+EGFR
ALT: 32 IU/L (ref 0–44)
AST: 21 IU/L (ref 0–40)
Albumin: 4.5 g/dL (ref 3.9–4.9)
Alkaline Phosphatase: 67 IU/L (ref 47–123)
BUN/Creatinine Ratio: 13 (ref 10–24)
BUN: 14 mg/dL (ref 8–27)
Bilirubin Total: 0.6 mg/dL (ref 0.0–1.2)
CO2: 20 mmol/L (ref 20–29)
Calcium: 9.4 mg/dL (ref 8.6–10.2)
Chloride: 101 mmol/L (ref 96–106)
Creatinine, Ser: 1.12 mg/dL (ref 0.76–1.27)
Globulin, Total: 2.4 g/dL (ref 1.5–4.5)
Glucose: 74 mg/dL (ref 70–99)
Potassium: 4.4 mmol/L (ref 3.5–5.2)
Sodium: 138 mmol/L (ref 134–144)
Total Protein: 6.9 g/dL (ref 6.0–8.5)
eGFR: 72 mL/min/1.73 (ref >59)

## 2024-08-10 ENCOUNTER — Ambulatory Visit: Payer: Self-pay

## 2024-08-10 DIAGNOSIS — E66812 Obesity, class 2: Secondary | ICD-10-CM | POA: Insufficient documentation

## 2024-08-10 DIAGNOSIS — I10 Essential (primary) hypertension: Secondary | ICD-10-CM | POA: Insufficient documentation

## 2024-08-10 NOTE — Assessment & Plan Note (Signed)
 Managed with metformin  and Ozempic.  No medication changes made today. Update fasting labs. - Continue metformin  and Ozempic.

## 2024-08-10 NOTE — Assessment & Plan Note (Signed)
 Chronic pain in left hip post-replacement. On hydrocodone . Referral to pain management discussed. - Referred to pain management for chronic hip pain.

## 2024-08-10 NOTE — Assessment & Plan Note (Signed)
 Discussed need for weight reduction with healthy diet and regular exercise, which is somewhat limited due to his chronic hip pain.

## 2024-08-10 NOTE — Assessment & Plan Note (Signed)
 Atherosclerotic heart disease of native coronary artery, status post coronary artery bypass grafting and stenting Status post coronary artery bypass grafting and stenting. On 81 mg daily aspirin . - Continue 81 mg daily aspirin .

## 2024-08-10 NOTE — Assessment & Plan Note (Signed)
 Blood pressure elevated at 132/82 mmHg. On lisinopril  and hydrochlorothiazide . - Rechecked blood pressure. - Continue lisinopril  and hydrochlorothiazide .

## 2024-08-10 NOTE — Assessment & Plan Note (Signed)
 Managed with pravastatin  20 mg.  Update fasting labs today.  No medication changes made at this time

## 2024-10-03 ENCOUNTER — Other Ambulatory Visit (HOSPITAL_COMMUNITY): Payer: Self-pay

## 2024-10-03 ENCOUNTER — Telehealth: Payer: Self-pay | Admitting: Pharmacy Technician

## 2024-10-03 NOTE — Telephone Encounter (Signed)
 Pharmacy Patient Advocate Encounter  Received notification from [facility] that Prior Authorization for Ozempic  (2 MG/DOSE) 8MG /3ML pen-injectors has been APPROVED from 10/03/2024 to 09/26/2025. Ran test claim, Copay is $12.65. This test claim was processed through [facility]- copay amounts may vary at other pharmacies due to pharmacy/plan contracts, or as the patient moves through the different stages of their insurance plan.   PA #/Case ID/Reference #: [id number]

## 2024-10-03 NOTE — Telephone Encounter (Signed)
 Pharmacy Patient Advocate Encounter   Received notification from [employer] that prior authorization for Ozempic  (2 MG/DOSE) 8MG /3ML pen-injectors is required/requested.   Insurance verification completed.   The patient is insured through [employer].   Per test claim: PA required; PA submitted to above mentioned insurance via Latent Key/confirmation #/[id number] Status is pending

## 2025-02-07 ENCOUNTER — Ambulatory Visit
